# Patient Record
Sex: Female | Born: 1965 | Race: Black or African American | Hispanic: No | Marital: Single | Smoking: Current every day smoker
Health system: Southern US, Community
[De-identification: ages and names within clinical notes are randomized; demographics above are authoritative.]

## PROBLEM LIST (undated history)

## (undated) DIAGNOSIS — U071 COVID-19: Secondary | ICD-10-CM

## (undated) DIAGNOSIS — G4730 Sleep apnea, unspecified: Secondary | ICD-10-CM

## (undated) DIAGNOSIS — F329 Major depressive disorder, single episode, unspecified: Secondary | ICD-10-CM

## (undated) DIAGNOSIS — R519 Headache, unspecified: Secondary | ICD-10-CM

## (undated) DIAGNOSIS — M549 Dorsalgia, unspecified: Secondary | ICD-10-CM

## (undated) DIAGNOSIS — M797 Fibromyalgia: Secondary | ICD-10-CM

## (undated) DIAGNOSIS — F419 Anxiety disorder, unspecified: Secondary | ICD-10-CM

## (undated) DIAGNOSIS — G8929 Other chronic pain: Secondary | ICD-10-CM

## (undated) DIAGNOSIS — M069 Rheumatoid arthritis, unspecified: Secondary | ICD-10-CM

## (undated) DIAGNOSIS — M1990 Unspecified osteoarthritis, unspecified site: Secondary | ICD-10-CM

## (undated) DIAGNOSIS — F32A Depression, unspecified: Secondary | ICD-10-CM

## (undated) DIAGNOSIS — K219 Gastro-esophageal reflux disease without esophagitis: Secondary | ICD-10-CM

## (undated) DIAGNOSIS — E785 Hyperlipidemia, unspecified: Secondary | ICD-10-CM

## (undated) DIAGNOSIS — D649 Anemia, unspecified: Secondary | ICD-10-CM

## (undated) DIAGNOSIS — I729 Aneurysm of unspecified site: Secondary | ICD-10-CM

## (undated) DIAGNOSIS — J449 Chronic obstructive pulmonary disease, unspecified: Secondary | ICD-10-CM

## (undated) HISTORY — DX: Unspecified osteoarthritis, unspecified site: M19.90

## (undated) HISTORY — DX: Hyperlipidemia, unspecified: E78.5

## (undated) HISTORY — PX: APPENDECTOMY: SHX54

## (undated) HISTORY — PX: OVARIAN CYST REMOVAL: SHX89

## (undated) HISTORY — PX: CHOLECYSTECTOMY: SHX55

## (undated) HISTORY — PX: ABDOMINAL HYSTERECTOMY: SHX81

## (undated) HISTORY — PX: BACK SURGERY: SHX140

## (undated) HISTORY — DX: Sleep apnea, unspecified: G47.30

## (undated) HISTORY — DX: Gastro-esophageal reflux disease without esophagitis: K21.9

---

## 2009-12-13 ENCOUNTER — Emergency Department (HOSPITAL_BASED_OUTPATIENT_CLINIC_OR_DEPARTMENT_OTHER): Admission: EM | Admit: 2009-12-13 | Discharge: 2009-12-13 | Payer: Self-pay | Admitting: Emergency Medicine

## 2010-01-19 ENCOUNTER — Emergency Department (HOSPITAL_BASED_OUTPATIENT_CLINIC_OR_DEPARTMENT_OTHER): Admission: EM | Admit: 2010-01-19 | Discharge: 2010-01-19 | Payer: Self-pay | Admitting: Emergency Medicine

## 2010-05-02 ENCOUNTER — Emergency Department (HOSPITAL_BASED_OUTPATIENT_CLINIC_OR_DEPARTMENT_OTHER): Admission: EM | Admit: 2010-05-02 | Discharge: 2010-05-02 | Payer: Self-pay | Admitting: Emergency Medicine

## 2010-05-02 ENCOUNTER — Ambulatory Visit: Payer: Self-pay | Admitting: Diagnostic Radiology

## 2010-05-17 ENCOUNTER — Emergency Department (HOSPITAL_BASED_OUTPATIENT_CLINIC_OR_DEPARTMENT_OTHER): Admission: EM | Admit: 2010-05-17 | Discharge: 2010-05-17 | Payer: Self-pay | Admitting: Emergency Medicine

## 2010-05-17 ENCOUNTER — Ambulatory Visit: Payer: Self-pay | Admitting: Diagnostic Radiology

## 2010-09-05 ENCOUNTER — Ambulatory Visit: Payer: Self-pay | Admitting: Diagnostic Radiology

## 2010-09-05 ENCOUNTER — Emergency Department (HOSPITAL_BASED_OUTPATIENT_CLINIC_OR_DEPARTMENT_OTHER): Admission: EM | Admit: 2010-09-05 | Discharge: 2010-09-05 | Payer: Self-pay | Admitting: Emergency Medicine

## 2010-12-28 ENCOUNTER — Emergency Department (HOSPITAL_BASED_OUTPATIENT_CLINIC_OR_DEPARTMENT_OTHER)
Admission: EM | Admit: 2010-12-28 | Discharge: 2010-12-29 | Payer: Self-pay | Source: Home / Self Care | Admitting: Emergency Medicine

## 2010-12-28 LAB — COMPREHENSIVE METABOLIC PANEL
ALT: 21 U/L (ref 0–35)
Alkaline Phosphatase: 91 U/L (ref 39–117)
BUN: 7 mg/dL (ref 6–23)
CO2: 25 mEq/L (ref 19–32)
Calcium: 9.9 mg/dL (ref 8.4–10.5)
GFR calc non Af Amer: >60 mL/min (ref >60)
Glucose, Bld: 89 mg/dL (ref 70–99)
Potassium: 3.7 mEq/L (ref 3.5–5.1)
Sodium: 143 mEq/L (ref 135–145)

## 2010-12-28 LAB — URINALYSIS, ROUTINE W REFLEX MICROSCOPIC
Bilirubin Urine: NEGATIVE
Leukocytes, UA: NEGATIVE
Nitrite: NEGATIVE
Specific Gravity, Urine: 1.021 (ref 1.005–1.030)
Urobilinogen, UA: 1 mg/dL (ref 0.0–1.0)

## 2010-12-28 LAB — CBC
MCV: 87 fL (ref 78.0–100.0)
Platelets: 188 10*3/uL (ref 150–400)
RBC: 4.37 MIL/uL (ref 3.87–5.11)
RDW: 14 % (ref 11.5–15.5)
WBC: 8.5 10*3/uL (ref 4.0–10.5)

## 2010-12-28 LAB — URINE MICROSCOPIC-ADD ON

## 2010-12-28 LAB — LIPASE, BLOOD: Lipase: 50 U/L (ref 23–300)

## 2010-12-28 LAB — DIFFERENTIAL
Basophils Absolute: 0 10*3/uL (ref 0.0–0.1)
Basophils Relative: 0 % (ref 0–1)
Eosinophils Absolute: 0.1 10*3/uL (ref 0.0–0.7)
Eosinophils Relative: 2 % (ref 0–5)
Lymphs Abs: 2.4 10*3/uL (ref 0.7–4.0)
Neutrophils Relative %: 64 % (ref 43–77)

## 2011-02-16 LAB — WET PREP, GENITAL: Trich, Wet Prep: NONE SEEN

## 2011-02-16 LAB — URINALYSIS, ROUTINE W REFLEX MICROSCOPIC
Bilirubin Urine: NEGATIVE
Protein, ur: NEGATIVE mg/dL
Urobilinogen, UA: 1 mg/dL (ref 0.0–1.0)

## 2011-02-16 LAB — GC/CHLAMYDIA PROBE AMP, GENITAL
Chlamydia, DNA Probe: NEGATIVE
GC Probe Amp, Genital: NEGATIVE

## 2011-02-17 LAB — URINE CULTURE
Colony Count: NO GROWTH
Culture: NO GROWTH

## 2011-02-17 LAB — URINALYSIS, ROUTINE W REFLEX MICROSCOPIC
Ketones, ur: 15 mg/dL — AB
Nitrite: NEGATIVE
Protein, ur: NEGATIVE mg/dL
Urobilinogen, UA: 1 mg/dL (ref 0.0–1.0)
pH: 5.5 (ref 5.0–8.0)

## 2011-02-17 LAB — URINE MICROSCOPIC-ADD ON

## 2011-02-23 ENCOUNTER — Emergency Department (HOSPITAL_BASED_OUTPATIENT_CLINIC_OR_DEPARTMENT_OTHER)
Admission: EM | Admit: 2011-02-23 | Discharge: 2011-02-24 | Disposition: A | Discharge status: Home / Self Care | Payer: Medicaid Other | Attending: Emergency Medicine | Admitting: Emergency Medicine

## 2011-02-23 DIAGNOSIS — K219 Gastro-esophageal reflux disease without esophagitis: Secondary | ICD-10-CM | POA: Insufficient documentation

## 2011-02-23 DIAGNOSIS — R109 Unspecified abdominal pain: Secondary | ICD-10-CM | POA: Insufficient documentation

## 2011-02-23 DIAGNOSIS — F17200 Nicotine dependence, unspecified, uncomplicated: Secondary | ICD-10-CM | POA: Insufficient documentation

## 2011-02-23 DIAGNOSIS — R319 Hematuria, unspecified: Secondary | ICD-10-CM | POA: Insufficient documentation

## 2011-02-23 DIAGNOSIS — IMO0001 Reserved for inherently not codable concepts without codable children: Secondary | ICD-10-CM | POA: Insufficient documentation

## 2011-02-23 DIAGNOSIS — G8929 Other chronic pain: Secondary | ICD-10-CM | POA: Insufficient documentation

## 2011-02-23 DIAGNOSIS — J45909 Unspecified asthma, uncomplicated: Secondary | ICD-10-CM | POA: Insufficient documentation

## 2011-02-23 DIAGNOSIS — F319 Bipolar disorder, unspecified: Secondary | ICD-10-CM | POA: Insufficient documentation

## 2011-02-23 LAB — URINE MICROSCOPIC-ADD ON

## 2011-02-23 LAB — URINALYSIS, ROUTINE W REFLEX MICROSCOPIC
Glucose, UA: NEGATIVE mg/dL
Ketones, ur: 15 mg/dL — AB
Leukocytes, UA: NEGATIVE
Protein, ur: NEGATIVE mg/dL

## 2011-02-24 ENCOUNTER — Emergency Department (INDEPENDENT_AMBULATORY_CARE_PROVIDER_SITE_OTHER): Payer: Medicaid Other

## 2011-02-24 DIAGNOSIS — Z9079 Acquired absence of other genital organ(s): Secondary | ICD-10-CM

## 2011-02-24 DIAGNOSIS — R109 Unspecified abdominal pain: Secondary | ICD-10-CM

## 2011-02-25 LAB — URINE CULTURE: Culture  Setup Time: 201203261546

## 2011-06-14 ENCOUNTER — Encounter: Payer: Self-pay | Admitting: *Deleted

## 2011-06-14 ENCOUNTER — Emergency Department (HOSPITAL_BASED_OUTPATIENT_CLINIC_OR_DEPARTMENT_OTHER)
Admission: EM | Admit: 2011-06-14 | Discharge: 2011-06-14 | Disposition: A | Discharge status: Home / Self Care | Payer: Medicaid Other | Attending: Emergency Medicine | Admitting: Emergency Medicine

## 2011-06-14 ENCOUNTER — Emergency Department (INDEPENDENT_AMBULATORY_CARE_PROVIDER_SITE_OTHER): Payer: Medicaid Other

## 2011-06-14 DIAGNOSIS — R209 Unspecified disturbances of skin sensation: Secondary | ICD-10-CM | POA: Insufficient documentation

## 2011-06-14 DIAGNOSIS — R51 Headache: Secondary | ICD-10-CM

## 2011-06-14 DIAGNOSIS — F17200 Nicotine dependence, unspecified, uncomplicated: Secondary | ICD-10-CM | POA: Insufficient documentation

## 2011-06-14 HISTORY — DX: Depression, unspecified: F32.A

## 2011-06-14 HISTORY — DX: Anxiety disorder, unspecified: F41.9

## 2011-06-14 HISTORY — DX: Major depressive disorder, single episode, unspecified: F32.9

## 2011-06-14 MED ORDER — SODIUM CHLORIDE 0.9 % IV BOLUS (SEPSIS)
1000.0000 mL | Freq: Once | INTRAVENOUS | Status: AC
  Administered 2011-06-14: 1000 mL via INTRAVENOUS

## 2011-06-14 MED ORDER — METOCLOPRAMIDE HCL 10 MG PO TABS: 10.0000 mg | ORAL_TABLET | Freq: Four times a day (QID) | ORAL | Status: DC

## 2011-06-14 MED ORDER — IBUPROFEN 600 MG PO TABS: 600.0000 mg | ORAL_TABLET | Freq: Four times a day (QID) | ORAL | Status: AC | PRN

## 2011-06-14 MED ORDER — METOCLOPRAMIDE HCL 5 MG/ML IJ SOLN
10.0000 mg | Freq: Once | INTRAMUSCULAR | Status: AC
  Administered 2011-06-14: 10 mg via INTRAVENOUS
  Filled 2011-06-14: qty 2

## 2011-06-14 MED ORDER — KETOROLAC TROMETHAMINE 30 MG/ML IJ SOLN
30.0000 mg | Freq: Once | INTRAMUSCULAR | Status: AC
  Administered 2011-06-14: 30 mg via INTRAVENOUS
  Filled 2011-06-14: qty 1

## 2011-06-14 NOTE — ED Notes (Signed)
C/o constant headache x 1-2weeks. Described as "pins and needles". Mostly left sided and posterior head. Sensitive to light, noise.

## 2011-06-14 NOTE — ED Provider Notes (Signed)
History     Chief Complaint  Patient presents with  . Headache   HPI Comments: The patient states she has had 2 weeks of ongoing constant headache located in the posterior part of her head at the inferior part of her scalp in the upper neck. This has been constant worse with eating or drinking bright lights or loud sounds. She denies nausea vomiting fever chills., But she has had some tingling and mild numbness to her left upper extremity. There has been no difficulties with speech vision or lower extremity problems no ataxia and no coordination problems.  Patient is a 45 y.o. female presenting with headaches. The history is provided by the patient.  Headache  This is a new problem. Episode onset: 2 weeks ago. The problem occurs constantly. The problem has not changed since onset.The headache is associated with nothing. Pain location: Posterior and left posterior and left upper neck. The quality of the pain is described as sharp. The pain is at a severity of 10/10. The pain is severe. The pain radiates to the face. Pertinent negatives include no fever, no shortness of breath, no nausea and no vomiting. Associated symptoms comments: Left upper extremity numbness with pins and needles.. She has tried NSAIDs for the symptoms. The treatment provided no relief.    Past Medical History  Diagnosis Date  . Asthma   . Anxiety   . Depression     Past Surgical History  Procedure Date  . Back surgery   . Abdominal hysterectomy   . Ovarian cyst removal   . Appendectomy     History reviewed. No pertinent family history.  History  Substance Use Topics  . Smoking status: Current Everyday Smoker  . Smokeless tobacco: Not on file  . Alcohol Use: No    OB History    Grav Para Term Preterm Abortions TAB SAB Ect Mult Living                  Review of Systems  Constitutional: Negative for fever and chills.  HENT: Negative for sore throat and neck pain.   Eyes: Negative for visual  disturbance.  Respiratory: Negative for cough and shortness of breath.   Cardiovascular: Negative for chest pain.  Gastrointestinal: Negative for nausea, vomiting, abdominal pain and diarrhea.  Genitourinary: Negative for dysuria and frequency.  Musculoskeletal: Negative for back pain.  Skin: Negative for rash.  Neurological: Positive for numbness and headaches. Negative for weakness.  Hematological: Negative for adenopathy.  Psychiatric/Behavioral: Negative for behavioral problems.    Physical Exam  BP 141/85  Pulse 89  Temp(Src) 98.2 F (36.8 C) (Oral)  Ht 5\' 4"  (1.626 m)  Wt 217 lb (98.431 kg)  BMI 37.25 kg/m2  SpO2 97%  Physical Exam  Constitutional: She appears well-developed and well-nourished. No distress.  HENT:  Head: Normocephalic and atraumatic.  Mouth/Throat: Oropharynx is clear and moist. No oropharyngeal exudate.  Eyes: Conjunctivae and EOM are normal. Pupils are equal, round, and reactive to light. Right eye exhibits no discharge. Left eye exhibits no discharge. No scleral icterus.  Neck: Normal range of motion. Neck supple. No JVD present. No thyromegaly present.  Cardiovascular: Normal rate, regular rhythm, normal heart sounds and intact distal pulses.  Exam reveals no gallop and no friction rub.   No murmur heard. Pulmonary/Chest: Effort normal and breath sounds normal. No respiratory distress. She has no wheezes. She has no rales.  Abdominal: Soft. Bowel sounds are normal. She exhibits no distension and no mass. There  is no tenderness.  Musculoskeletal: Normal range of motion. She exhibits no edema and no tenderness.  Lymphadenopathy:    She has no cervical adenopathy.  Neurological: She is alert. Coordination normal.       Normal peripheral visual fields, speech, strength of upper and lower extremities. She has slight decreased sensation in the left upper extremity from the shoulder to the fingertips. She has no limb ataxia.  Skin: Skin is warm and dry. No  rash noted. She is not diaphoretic. No erythema.  Psychiatric: She has a normal mood and affect. Her behavior is normal.    ED Course  Procedures  MDM Patient has normal vital signs with a temperature of 98.2. Due to her headaches and left upper extremity numbness, will require head CT to rule out stroke. Patient states she has not had much to eat or drink because of increased pain with moving around the house. We'll give IV fluids and IV medications including Toradol and Reglan and reevaluate.   Patient has improved completely with medications and IV fluids. Agreeable to discharge. CT scan reviewed with no acute findings.   Vida Roller, MD 06/14/11 [phone]

## 2011-09-07 ENCOUNTER — Emergency Department (INDEPENDENT_AMBULATORY_CARE_PROVIDER_SITE_OTHER): Payer: Medicaid Other

## 2011-09-07 ENCOUNTER — Encounter (HOSPITAL_BASED_OUTPATIENT_CLINIC_OR_DEPARTMENT_OTHER): Payer: Self-pay

## 2011-09-07 ENCOUNTER — Emergency Department (HOSPITAL_BASED_OUTPATIENT_CLINIC_OR_DEPARTMENT_OTHER)
Admission: EM | Admit: 2011-09-07 | Discharge: 2011-09-07 | Disposition: A | Discharge status: Home / Self Care | Payer: Medicaid Other | Attending: Emergency Medicine | Admitting: Emergency Medicine

## 2011-09-07 DIAGNOSIS — W230XXA Caught, crushed, jammed, or pinched between moving objects, initial encounter: Secondary | ICD-10-CM

## 2011-09-07 DIAGNOSIS — S6980XA Other specified injuries of unspecified wrist, hand and finger(s), initial encounter: Secondary | ICD-10-CM

## 2011-09-07 DIAGNOSIS — J45909 Unspecified asthma, uncomplicated: Secondary | ICD-10-CM | POA: Insufficient documentation

## 2011-09-07 DIAGNOSIS — F341 Dysthymic disorder: Secondary | ICD-10-CM | POA: Insufficient documentation

## 2011-09-07 DIAGNOSIS — S6390XA Sprain of unspecified part of unspecified wrist and hand, initial encounter: Secondary | ICD-10-CM

## 2011-09-07 DIAGNOSIS — S60219A Contusion of unspecified wrist, initial encounter: Secondary | ICD-10-CM | POA: Insufficient documentation

## 2011-09-07 DIAGNOSIS — S60229A Contusion of unspecified hand, initial encounter: Secondary | ICD-10-CM

## 2011-09-07 DIAGNOSIS — M25539 Pain in unspecified wrist: Secondary | ICD-10-CM

## 2011-09-07 DIAGNOSIS — S63509A Unspecified sprain of unspecified wrist, initial encounter: Secondary | ICD-10-CM

## 2011-09-07 DIAGNOSIS — S6990XA Unspecified injury of unspecified wrist, hand and finger(s), initial encounter: Secondary | ICD-10-CM

## 2011-09-07 DIAGNOSIS — S60211A Contusion of right wrist, initial encounter: Secondary | ICD-10-CM

## 2011-09-07 MED ORDER — HYDROCODONE-ACETAMINOPHEN 5-325 MG PO TABS: 2.0000 | ORAL_TABLET | ORAL | Status: AC | PRN

## 2011-09-07 MED ORDER — HYDROCODONE-ACETAMINOPHEN 5-325 MG PO TABS
2.0000 | ORAL_TABLET | Freq: Once | ORAL | Status: AC
  Administered 2011-09-07: 2 via ORAL
  Filled 2011-09-07: qty 2

## 2011-09-07 MED ORDER — IBUPROFEN 800 MG PO TABS: 800.0000 mg | ORAL_TABLET | Freq: Once | ORAL | Status: DC

## 2011-09-07 NOTE — ED Notes (Signed)
Pt states that she got her hand caught in a car door.  Pt explains that it was slammed in the door.  Pt reports that she is unable to move her R index and middle fingers.  Circulation intact, no break in skin, no swelling noted at this time.

## 2011-09-08 NOTE — ED Provider Notes (Signed)
History     CSN: [id number] Arrival date & time: 09/07/2011  3:27 AM  Chief Complaint  Patient presents with  . Hand Injury    (Consider location/radiation/quality/duration/timing/severity/associated sxs/prior treatment) HPI Comments: The patient presents for evaluation of right hand pain after it was reportedly shut in a car door.  There is no break in the skin.  She complains principally of pain in the index and middle fingers.  Patient is a 45 y.o. female presenting with hand injury. The history is provided by the patient.  Hand Injury  The incident occurred 1 to 2 hours ago. The injury mechanism was compression. The pain is present in the right hand. The quality of the pain is described as aching and throbbing. The pain is moderate. The pain has been constant since the incident. She reports no foreign bodies present. The symptoms are aggravated by movement, use and palpation. She has tried nothing for the symptoms.    Past Medical History  Diagnosis Date  . Asthma   . Anxiety   . Depression     Past Surgical History  Procedure Date  . Back surgery   . Abdominal hysterectomy   . Ovarian cyst removal   . Appendectomy     History reviewed. No pertinent family history.  History  Substance Use Topics  . Smoking status: Current Everyday Smoker  . Smokeless tobacco: Not on file  . Alcohol Use: No    OB History    Grav Para Term Preterm Abortions TAB SAB Ect Mult Living                  Review of Systems  Musculoskeletal: Positive for joint swelling and arthralgias. Negative for back pain and gait problem.  Skin: Negative for color change and wound.  Neurological: Negative for numbness.    Allergies  Aspirin and Lithium  Home Medications   Current Outpatient Rx  Name Route Sig Dispense Refill  . FLUTICASONE-SALMETEROL 230-21 MCG/ACT IN AERO Inhalation Inhale 1 puff into the lungs 2 (two) times daily.      Marland Kitchen ALPRAZOLAM 1 MG PO TABS Oral Take 1 mg by mouth 3  (three) times daily.      Marland Kitchen HYDROCODONE-ACETAMINOPHEN 5-325 MG PO TABS Oral Take 2 tablets by mouth every 4 (four) hours as needed for pain. 20 tablet 0  . VENLAFAXINE HCL 75 MG PO TABS Oral Take 75 mg by mouth 2 (two) times daily.        BP 110/71  Pulse 111  Temp(Src) 98.3 F (36.8 C) (Oral)  Resp 18  Ht 5\' 4"  (1.626 m)  Wt 217 lb (98.431 kg)  BMI 37.25 kg/m2  SpO2 99%  Physical Exam  Nursing note and vitals reviewed. Constitutional: She is oriented to person, place, and time. She appears well-developed and well-nourished. She appears distressed.  HENT:  Head: Normocephalic and atraumatic.  Eyes: EOM are normal. Pupils are equal, round, and reactive to light.  Neck: Normal range of motion. Neck supple.  Cardiovascular: Normal rate, regular rhythm and intact distal pulses.   Pulmonary/Chest: Effort normal and breath sounds normal. No accessory muscle usage. No respiratory distress.  Abdominal: Normal appearance.  Musculoskeletal: She exhibits edema and tenderness.       Right hand: She exhibits decreased range of motion, tenderness, bony tenderness and swelling. She exhibits normal capillary refill and no deformity. normal sensation noted.       Hands: Neurological: She is alert and oriented to person, place, and time. She  has normal strength and normal reflexes. No cranial nerve deficit. Coordination normal. GCS eye subscore is 4. GCS verbal subscore is 5. GCS motor subscore is 6.  Skin: Skin is warm and dry.    ED Course  Procedures (including critical care time)  Labs Reviewed - No data to display Dg Wrist Complete Right  09/07/2011  *RADIOLOGY REPORT*  Clinical Data: Status post trauma, pain.  RIGHT WRIST - COMPLETE 3+ VIEW  Comparison: Contemporaneous hand  Findings: No displaced acute fracture or dislocation identified. No aggressive appearing osseous lesion.  IMPRESSION: No acute osseous abnormality of the wrist identified. If clinical concern for a fracture persists,  recommend a repeat radiograph in 5- 10 days to evaluate for interval change or callus formation.  Original Report Authenticated By: Waneta Martins, M.D.   Dg Hand Complete Right  09/07/2011  *RADIOLOGY REPORT*  Clinical Data: Status post trauma, unable to move fourth and fifth digits.  RIGHT HAND - COMPLETE 3+ VIEW  Comparison: Contemporaneous wrist  Findings: The fourth and fifth digits are hyperflexed at the proximal phalangeal joint.  No acute osseous abnormality identified.  No radiopaque foreign body.  IMPRESSION: Flexion of the fourth and fifth digits at the PIP joint may represent sequelae of tendon injury. No acute osseous abnormality identified.  Original Report Authenticated By: Waneta Martins, M.D.     1. Contusion of wrist, right   2. Wrist sprain   3. Hand contusion   4. Hand sprain       MDM  Contusion vs. Fracture considered among other potential etiologies in the patient's differential diagnosis.         Felisa Bonier, MD 09/08/11 2001

## 2011-11-03 ENCOUNTER — Encounter (HOSPITAL_BASED_OUTPATIENT_CLINIC_OR_DEPARTMENT_OTHER): Payer: Self-pay | Admitting: *Deleted

## 2011-11-03 ENCOUNTER — Emergency Department (INDEPENDENT_AMBULATORY_CARE_PROVIDER_SITE_OTHER): Payer: Medicaid Other

## 2011-11-03 DIAGNOSIS — J40 Bronchitis, not specified as acute or chronic: Secondary | ICD-10-CM | POA: Insufficient documentation

## 2011-11-03 DIAGNOSIS — R079 Chest pain, unspecified: Secondary | ICD-10-CM | POA: Insufficient documentation

## 2011-11-03 DIAGNOSIS — R05 Cough: Secondary | ICD-10-CM

## 2011-11-03 DIAGNOSIS — R059 Cough, unspecified: Secondary | ICD-10-CM

## 2011-11-03 DIAGNOSIS — F17200 Nicotine dependence, unspecified, uncomplicated: Secondary | ICD-10-CM | POA: Insufficient documentation

## 2011-11-03 DIAGNOSIS — F341 Dysthymic disorder: Secondary | ICD-10-CM | POA: Insufficient documentation

## 2011-11-03 DIAGNOSIS — J45909 Unspecified asthma, uncomplicated: Secondary | ICD-10-CM | POA: Insufficient documentation

## 2011-11-03 DIAGNOSIS — R0789 Other chest pain: Secondary | ICD-10-CM

## 2011-11-03 NOTE — ED Notes (Signed)
Pt presents to ED today with cough for the last 3 weeks.  Pt states chest "feels heavy" with coughing.  Pt has tried OTC cough medicine with no relief in sx

## 2011-11-04 ENCOUNTER — Emergency Department (HOSPITAL_BASED_OUTPATIENT_CLINIC_OR_DEPARTMENT_OTHER)
Admission: EM | Admit: 2011-11-04 | Discharge: 2011-11-04 | Disposition: A | Discharge status: Home / Self Care | Payer: Medicaid Other | Attending: Emergency Medicine | Admitting: Emergency Medicine

## 2011-11-04 DIAGNOSIS — J40 Bronchitis, not specified as acute or chronic: Secondary | ICD-10-CM

## 2011-11-04 MED ORDER — ALBUTEROL SULFATE HFA 108 (90 BASE) MCG/ACT IN AERS
2.0000 | INHALATION_SPRAY | RESPIRATORY_TRACT | Status: DC | PRN
  Administered 2011-11-04: 2 via RESPIRATORY_TRACT
  Filled 2011-11-04: qty 6.7

## 2011-11-04 MED ORDER — HYDROCODONE-ACETAMINOPHEN 7.5-500 MG/15ML PO SOLN
10.0000 mL | Freq: Once | ORAL | Status: AC
  Administered 2011-11-04: 10 mL via ORAL
  Filled 2011-11-04: qty 15

## 2011-11-04 MED ORDER — HYDROCODONE-ACETAMINOPHEN 7.5-500 MG/15ML PO SOLN: 10.0000 mL | ORAL | Status: AC | PRN

## 2011-11-04 NOTE — ED Notes (Signed)
MD at bedside. 

## 2011-11-04 NOTE — ED Provider Notes (Signed)
History     CSN: [id number] Arrival date & time: 11/04/2011 12:23 AM   First MD Initiated Contact with Patient 11/04/11 0236      Chief Complaint  Patient presents with  . Cough       (Consider location/radiation/quality/duration/timing/severity/associated sxs/prior treatment) HPI This is a 45 year old black female with a three-week history of cough. Symptoms are moderate to severe. They're associated with shortness of breath and chest soreness but no fever, nausea, vomiting, diarrhea, earache or sore throat. She has a history of asthma but is out of albuterol. She was hoping her symptoms would resolve on their own but came to the ED because they persist. The cough is characterized as nonproductive.  Past Medical History  Diagnosis Date  . Asthma   . Anxiety   . Depression     Past Surgical History  Procedure Date  . Back surgery   . Abdominal hysterectomy   . Ovarian cyst removal   . Appendectomy     History reviewed. No pertinent family history.  History  Substance Use Topics  . Smoking status: Current Everyday Smoker  . Smokeless tobacco: Not on file  . Alcohol Use: No    OB History    Grav Para Term Preterm Abortions TAB SAB Ect Mult Living                  Review of Systems  All other systems reviewed and are negative.    Allergies  Aspirin and Lithium  Home Medications   Current Outpatient Rx  Name Route Sig Dispense Refill  . ALPRAZOLAM 1 MG PO TABS Oral Take 1 mg by mouth 3 (three) times daily.      Marland Kitchen FLUTICASONE-SALMETEROL 230-21 MCG/ACT IN AERO Inhalation Inhale 1 puff into the lungs 2 (two) times daily.      . VENLAFAXINE HCL 75 MG PO TABS Oral Take 75 mg by mouth 2 (two) times daily.        BP 117/72  Pulse 75  Temp(Src) 99.4 F (37.4 C) (Oral)  Resp 16  Ht 5\' 4"  (1.626 m)  Wt 217 lb (98.431 kg)  BMI 37.25 kg/m2  SpO2 95%  Physical Exam General: Well-developed, well-nourished female in no acute distress; appearance consistent  with age of record HENT: normocephalic, atraumatic; TMs normal; no pharyngeal erythema or exudate Eyes: pupils equal round and reactive to light; extraocular muscles intact; arcus senilis bilaterally Neck: supple Heart: regular rate and rhythm Lungs: Lung fields clear with mildly decreased air movement; coughing particularly after taking a deep breath Abdomen: soft; nontender; nondistended Extremities: No deformity; full range of motion Neurologic: Awake, alert and oriented; motor function intact in all extremities and symmetric; no facial droop Skin: Warm and dry     ED Course  Procedures (including critical care time)  Labs Reviewed - No data to display Dg Chest 2 View  11/04/2011  *RADIOLOGY REPORT*  Clinical Data: Mid chest tightness with cough for 3 weeks.  CHEST - 2 VIEW  Comparison: 05/17/2010 radiographs.  Findings: The heart size and mediastinal contours are stable. There is stable mild central airway thickening.  No confluent airspace opacity, edema or pleural effusion is identified.  The osseous structures appear unchanged.  IMPRESSION: Stable central airway thickening consistent with bronchitis or viral infection.  No evidence of pneumonia.  Original Report Authenticated By: Gerrianne Scale, M.D.      MDM           Hanley Seamen, MD 11/04/11 [phone]

## 2011-11-04 NOTE — ED Notes (Signed)
Pt has been having dry cough for few days, denies fever. Pt has been using halls and ny-quil cough without relief.

## 2012-01-16 ENCOUNTER — Encounter (HOSPITAL_BASED_OUTPATIENT_CLINIC_OR_DEPARTMENT_OTHER): Payer: Self-pay | Admitting: *Deleted

## 2012-01-16 DIAGNOSIS — J45909 Unspecified asthma, uncomplicated: Secondary | ICD-10-CM | POA: Insufficient documentation

## 2012-01-16 DIAGNOSIS — B9689 Other specified bacterial agents as the cause of diseases classified elsewhere: Secondary | ICD-10-CM | POA: Insufficient documentation

## 2012-01-16 DIAGNOSIS — F341 Dysthymic disorder: Secondary | ICD-10-CM | POA: Insufficient documentation

## 2012-01-16 DIAGNOSIS — A499 Bacterial infection, unspecified: Secondary | ICD-10-CM | POA: Insufficient documentation

## 2012-01-16 DIAGNOSIS — N760 Acute vaginitis: Secondary | ICD-10-CM | POA: Insufficient documentation

## 2012-01-16 DIAGNOSIS — R300 Dysuria: Secondary | ICD-10-CM | POA: Insufficient documentation

## 2012-01-16 LAB — URINALYSIS, ROUTINE W REFLEX MICROSCOPIC
Protein, ur: NEGATIVE mg/dL
Urobilinogen, UA: 1 mg/dL (ref 0.0–1.0)

## 2012-01-16 LAB — URINE MICROSCOPIC-ADD ON

## 2012-01-16 NOTE — ED Notes (Signed)
Cloudy urine. Lower left quad pain. Burning inside her vaginal.

## 2012-01-17 ENCOUNTER — Emergency Department (HOSPITAL_BASED_OUTPATIENT_CLINIC_OR_DEPARTMENT_OTHER)
Admission: EM | Admit: 2012-01-17 | Discharge: 2012-01-17 | Disposition: A | Discharge status: Home / Self Care | Payer: Medicaid Other | Attending: Emergency Medicine | Admitting: Emergency Medicine

## 2012-01-17 DIAGNOSIS — N760 Acute vaginitis: Secondary | ICD-10-CM

## 2012-01-17 DIAGNOSIS — B9689 Other specified bacterial agents as the cause of diseases classified elsewhere: Secondary | ICD-10-CM

## 2012-01-17 LAB — WET PREP, GENITAL: Trich, Wet Prep: NONE SEEN

## 2012-01-17 MED ORDER — METRONIDAZOLE IN NACL 5-0.79 MG/ML-% IV SOLN: 500.0000 mg | Freq: Once | INTRAVENOUS | Status: DC

## 2012-01-17 MED ORDER — METRONIDAZOLE 500 MG PO TABS
500.0000 mg | ORAL_TABLET | Freq: Once | ORAL | Status: AC
  Administered 2012-01-17: 500 mg via ORAL
  Filled 2012-01-17: qty 1

## 2012-01-17 MED ORDER — METRONIDAZOLE 500 MG PO TABS: 500.0000 mg | ORAL_TABLET | Freq: Two times a day (BID) | ORAL | Status: AC

## 2012-01-17 NOTE — ED Provider Notes (Signed)
History     CSN: [id number]  Arrival date & time 01/16/12  2243   First MD Initiated Contact with Patient 01/17/12 0022      Chief Complaint  Patient presents with  . Dysuria    (Consider location/radiation/quality/duration/timing/severity/associated sxs/prior treatment) HPI This is a 46 year old black female with a history of frequent urinary tract infections. She complains of burning of the vulva, worse with urination or intercourse. The symptoms are moderate. She has tried over-the-counter Monistat without relief. She also complains of left lower quadrant pain, but states this may be due to a known ovarian cyst. She denies fever. She's had some chills. She's had occasional nausea and vomiting. Her urine was noted to be cloudy in triage.   Past Medical History  Diagnosis Date  . Asthma   . Anxiety   . Depression     Past Surgical History  Procedure Date  . Back surgery   . Abdominal hysterectomy   . Ovarian cyst removal   . Appendectomy     No family history on file.  History  Substance Use Topics  . Smoking status: Current Everyday Smoker  . Smokeless tobacco: Not on file  . Alcohol Use: No    OB History    Grav Para Term Preterm Abortions TAB SAB Ect Mult Living                  Review of Systems  All other systems reviewed and are negative.    Allergies  Aspirin and Lithium  Home Medications   Current Outpatient Rx  Name Route Sig Dispense Refill  . ALPRAZOLAM 1 MG PO TABS Oral Take 1 mg by mouth 3 (three) times daily.      Marland Kitchen FLUTICASONE-SALMETEROL 250-50 MCG/DOSE IN AEPB Inhalation Inhale 1 puff into the lungs every 12 (twelve) hours.    . VENLAFAXINE HCL 75 MG PO TABS Oral Take 75 mg by mouth 3 (three) times daily.       BP 106/76  Pulse 78  Temp(Src) 98.1 F (36.7 C) (Oral)  Resp 22  SpO2 98%  Physical Exam General: Well-developed, well-nourished female in no acute distress; appearance consistent with age of record HENT: normocephalic,  atraumatic Eyes: pupils equal round and reactive to light; extraocular muscles intact Neck: supple Heart: regular rate and rhythm Lungs: clear to auscultation bilaterally Abdomen: soft; nondistende; mild left lower quadrant tenderness; no masses or hepatosplenomegaly; bowel sounds present GU: Physiologic appearing vaginal discharge; surgical absence of cervix; mild inflammation of vulvar mucosa Extremities: No deformity; full range of motion Neurologic: Awake, alert and oriented; motor function intact in all extremities and symmetric; no facial droop Skin: Warm and dry Psychiatric: Normal mood and affect    ED Course  Procedures (including critical care time)     MDM   Nursing notes and vitals signs, including pulse oximetry, reviewed.  Summary of this visit's results, reviewed by myself:  Labs:  Results for orders placed during the hospital encounter of 01/17/12  URINALYSIS, ROUTINE W REFLEX MICROSCOPIC      Component Value Range   Color, Urine YELLOW  YELLOW    APPearance CLEAR  CLEAR    Specific Gravity, Urine 1.028  1.005 - 1.030    pH 6.0  5.0 - 8.0    Glucose, UA NEGATIVE  NEGATIVE (mg/dL)   Hgb urine dipstick MODERATE (*) NEGATIVE    Bilirubin Urine NEGATIVE  NEGATIVE    Ketones, ur 15 (*) NEGATIVE (mg/dL)   Protein, ur NEGATIVE  NEGATIVE (mg/dL)   Urobilinogen, UA 1.0  0.0 - 1.0 (mg/dL)   Nitrite NEGATIVE  NEGATIVE    Leukocytes, UA NEGATIVE  NEGATIVE   URINE MICROSCOPIC-ADD ON      Component Value Range   Squamous Epithelial / LPF FEW (*) RARE    WBC, UA 0-2  <3 (WBC/hpf)   RBC / HPF 7-10  <3 (RBC/hpf)   Bacteria, UA FEW (*) RARE    Crystals CA OXALATE CRYSTALS (*) NEGATIVE    Urine-Other MUCOUS PRESENT    WET PREP, GENITAL      Component Value Range   Yeast Wet Prep HPF POC NONE SEEN  NONE SEEN    Trich, Wet Prep NONE SEEN  NONE SEEN    Clue Cells Wet Prep HPF POC MODERATE (*) NONE SEEN    WBC, Wet Prep HPF POC FEW (*) NONE SEEN               Hanley Seamen, MD 01/17/12 [phone]

## 2012-01-17 NOTE — Discharge Instructions (Signed)
Bacterial Vaginosis Bacterial vaginosis (BV) is a vaginal infection where the normal balance of bacteria in the vagina is disrupted. The normal balance is then replaced by an overgrowth of certain bacteria. There are several different kinds of bacteria that can cause BV. BV is the most common vaginal infection in women of childbearing age. CAUSES   The cause of BV is not fully understood. BV develops when there is an increase or imbalance of harmful bacteria.   Some activities or behaviors can upset the normal balance of bacteria in the vagina and put women at increased risk including:   Having a new sex partner or multiple sex partners.   Douching.   Using an intrauterine device (IUD) for contraception.   It is not clear what role sexual activity plays in the development of BV. However, women that have never had sexual intercourse are rarely infected with BV.  Women do not get BV from toilet seats, bedding, swimming pools or from touching objects around them.  SYMPTOMS   Grey vaginal discharge.   A fish-like odor with discharge, especially after sexual intercourse.   Itching or burning of the vagina and vulva.   Burning or pain with urination.   Some women have no signs or symptoms at all.  DIAGNOSIS  Your caregiver must examine the vagina for signs of BV. Your caregiver will perform lab tests and look at the sample of vaginal fluid through a microscope. They will look for bacteria and abnormal cells (clue cells).  TREATMENT  Sometimes BV will clear up without treatment. However, all women with symptoms of BV should be treated to avoid complications, especially if gynecology surgery is planned. Female partners generally do not need to be treated. However, BV may spread between female sex partners so treatment is helpful in preventing a recurrence of BV.   BV may be treated with antibiotics. The antibiotics come in either pill or vaginal cream forms. Either can be used with nonpregnant  or pregnant women, but the recommended dosages differ. These antibiotics are not harmful to the baby.   BV can recur after treatment. If this happens, a second round of antibiotics will often be prescribed.   Treatment is important for pregnant women. If not treated, BV can cause a premature delivery, especially for a pregnant woman who had a premature birth in the past. All pregnant women who have symptoms of BV should be checked and treated.   For chronic reoccurrence of BV, treatment with a type of prescribed gel vaginally twice a week is helpful.  HOME CARE INSTRUCTIONS   Finish all medication as directed by your caregiver.   Do not have sex until treatment is completed.   Tell your sexual partner that you have a vaginal infection. They should see their caregiver and be treated if they have problems, such as a mild rash or itching.   Practice safe sex. Use condoms. Only have 1 sex partner.  PREVENTION  Basic prevention steps can help reduce the risk of upsetting the natural balance of bacteria in the vagina and developing BV:  Do not have sexual intercourse (be abstinent).   Do not douche.   Use all of the medicine prescribed for treatment of BV, even if the signs and symptoms go away.   Tell your sex partner if you have BV. That way, they can be treated, if needed, to prevent reoccurrence.  SEEK MEDICAL CARE IF:   Your symptoms are not improving after 3 days of treatment.  You have increased discharge, pain, or fever.  MAKE SURE YOU:   Understand these instructions.   Will watch your condition.   Will get help right away if you are not doing well or get worse.  FOR MORE INFORMATION  Division of STD Prevention (DSTDP), Centers for Disease Control and Prevention: [employer] American [employer] Association (ASHA): www.ashastd.org  Document Released: 11/17/2005 Document Revised: 07/30/2011 Document Reviewed: 05/10/2009 [facility] Patient Information 2012 [place],  [place].

## 2012-01-18 LAB — URINE CULTURE
Colony Count: NO GROWTH
Culture: NO GROWTH

## 2012-01-19 LAB — GC/CHLAMYDIA PROBE AMP, GENITAL: Chlamydia, DNA Probe: NEGATIVE

## 2012-03-31 ENCOUNTER — Emergency Department (HOSPITAL_COMMUNITY)
Admission: EM | Admit: 2012-03-31 | Discharge: 2012-03-31 | Disposition: A | Discharge status: Home / Self Care | Payer: Medicare Other | Attending: Emergency Medicine | Admitting: Emergency Medicine

## 2012-03-31 ENCOUNTER — Emergency Department (HOSPITAL_COMMUNITY): Payer: Medicare Other

## 2012-03-31 ENCOUNTER — Encounter (HOSPITAL_COMMUNITY): Payer: Self-pay | Admitting: Emergency Medicine

## 2012-03-31 DIAGNOSIS — S6392XA Sprain of unspecified part of left wrist and hand, initial encounter: Secondary | ICD-10-CM

## 2012-03-31 DIAGNOSIS — M25569 Pain in unspecified knee: Secondary | ICD-10-CM | POA: Insufficient documentation

## 2012-03-31 DIAGNOSIS — M79609 Pain in unspecified limb: Secondary | ICD-10-CM | POA: Insufficient documentation

## 2012-03-31 DIAGNOSIS — M25562 Pain in left knee: Secondary | ICD-10-CM

## 2012-03-31 DIAGNOSIS — W19XXXA Unspecified fall, initial encounter: Secondary | ICD-10-CM

## 2012-03-31 DIAGNOSIS — IMO0002 Reserved for concepts with insufficient information to code with codable children: Secondary | ICD-10-CM | POA: Insufficient documentation

## 2012-03-31 DIAGNOSIS — Z79899 Other long term (current) drug therapy: Secondary | ICD-10-CM | POA: Insufficient documentation

## 2012-03-31 DIAGNOSIS — W010XXA Fall on same level from slipping, tripping and stumbling without subsequent striking against object, initial encounter: Secondary | ICD-10-CM | POA: Insufficient documentation

## 2012-03-31 DIAGNOSIS — F341 Dysthymic disorder: Secondary | ICD-10-CM | POA: Insufficient documentation

## 2012-03-31 DIAGNOSIS — S6390XA Sprain of unspecified part of unspecified wrist and hand, initial encounter: Secondary | ICD-10-CM | POA: Insufficient documentation

## 2012-03-31 DIAGNOSIS — S53402A Unspecified sprain of left elbow, initial encounter: Secondary | ICD-10-CM

## 2012-03-31 DIAGNOSIS — Y9229 Other specified public building as the place of occurrence of the external cause: Secondary | ICD-10-CM | POA: Insufficient documentation

## 2012-03-31 DIAGNOSIS — M25539 Pain in unspecified wrist: Secondary | ICD-10-CM | POA: Insufficient documentation

## 2012-03-31 DIAGNOSIS — M25519 Pain in unspecified shoulder: Secondary | ICD-10-CM | POA: Insufficient documentation

## 2012-03-31 DIAGNOSIS — J45909 Unspecified asthma, uncomplicated: Secondary | ICD-10-CM | POA: Insufficient documentation

## 2012-03-31 DIAGNOSIS — M542 Cervicalgia: Secondary | ICD-10-CM | POA: Insufficient documentation

## 2012-03-31 DIAGNOSIS — M25529 Pain in unspecified elbow: Secondary | ICD-10-CM | POA: Insufficient documentation

## 2012-03-31 MED ORDER — CYCLOBENZAPRINE HCL 10 MG PO TABS: 10.0000 mg | ORAL_TABLET | Freq: Two times a day (BID) | ORAL | Status: AC | PRN

## 2012-03-31 MED ORDER — ONDANSETRON 8 MG PO TBDP
8.0000 mg | ORAL_TABLET | Freq: Once | ORAL | Status: AC
  Administered 2012-03-31: 8 mg via ORAL
  Filled 2012-03-31: qty 1

## 2012-03-31 MED ORDER — HYDROMORPHONE HCL PF 1 MG/ML IJ SOLN
1.0000 mg | Freq: Once | INTRAMUSCULAR | Status: AC
  Administered 2012-03-31: 1 mg via INTRAMUSCULAR
  Filled 2012-03-31: qty 1

## 2012-03-31 MED ORDER — HYDROCODONE-ACETAMINOPHEN 5-500 MG PO TABS: 1.0000 | ORAL_TABLET | Freq: Four times a day (QID) | ORAL | Status: AC | PRN

## 2012-03-31 NOTE — ED Notes (Signed)
Pt. Brittney Sanford as she was going into store tonight on her left side.  Pt. Now c/o left hand pain, left knee pain, left shoulder pain, and neck pain.  She is rating her pain as a 10.  Pt. Ambulated to car before EMS arrived.

## 2012-03-31 NOTE — ED Notes (Signed)
UJW:[id number]> Expected date:03/31/12<BR> Expected time:<BR> Means of arrival:<BR> Comments:<BR> EMS 261 GC - fall/not immobilized/back pain/stable

## 2012-03-31 NOTE — ED Provider Notes (Signed)
History     CSN: [id number]  Arrival date & time 03/31/12  2020   First MD Initiated Contact with Patient 03/31/12 2044      Chief Complaint  Patient presents with  . Fall    (Consider location/radiation/quality/duration/timing/severity/associated sxs/prior treatment) Patient is a 46 y.o. female presenting with fall. The history is provided by the patient.  Fall The accident occurred 1 to 2 hours ago. The fall occurred while walking. She landed on a hard floor. There was no blood loss. The point of impact was the left shoulder and left knee (left hand, left elbow). The pain is present in the left knee, left elbow and left wrist (left hand). The pain is at a severity of 10/10. The pain is moderate. She was ambulatory at the scene. There was no entrapment after the fall. There was no alcohol use involved in the accident. Pertinent negatives include no fever and no abdominal pain. The symptoms are aggravated by ambulation, extension, flexion and use of the injured limb.  Pt states she fell after slipping on something on the floor at a store. States fell on left side. Pain in left entire arm, left hand, left knee. Stats was able to ambulate. Pain 10/10. Denies hitting her head. Denies LOC.   Past Medical History  Diagnosis Date  . Asthma   . Anxiety   . Depression     Past Surgical History  Procedure Date  . Back surgery   . Abdominal hysterectomy   . Ovarian cyst removal   . Appendectomy     No family history on file.  History  Substance Use Topics  . Smoking status: Current Everyday Smoker  . Smokeless tobacco: Not on file  . Alcohol Use: No    OB History    Grav Para Term Preterm Abortions TAB SAB Ect Mult Living                  Review of Systems  Constitutional: Negative for fever and chills.  HENT: Positive for neck pain.   Eyes: Negative.   Respiratory: Negative.   Cardiovascular: Negative.   Gastrointestinal: Negative for abdominal pain.  Genitourinary:  Negative.   Musculoskeletal: Positive for joint swelling and arthralgias.  Skin: Negative.   Neurological: Negative for syncope, weakness and light-headedness.    Allergies  Aspirin and Lithium  Home Medications   Current Outpatient Rx  Name Route Sig Dispense Refill  . ALPRAZOLAM 1 MG PO TABS Oral Take 1 mg by mouth 3 (three) times daily.     Marland Kitchen ESTRADIOL 1 MG PO TABS Oral Take 2 mg by mouth daily.    Marland Kitchen OMEPRAZOLE 20 MG PO CPDR Oral Take 20 mg by mouth daily.      BP 105/68  Pulse 80  Temp(Src) 98.3 F (36.8 C) (Oral)  Resp 18  SpO2 100%  Physical Exam  Nursing note and vitals reviewed. Constitutional: She is oriented to person, place, and time. She appears well-developed and well-nourished.       Appears to be in pain  HENT:  Head: Normocephalic and atraumatic.  Eyes: Conjunctivae are normal.  Neck: Neck supple.  Cardiovascular: Normal rate, regular rhythm and normal heart sounds.   Pulmonary/Chest: Effort normal and breath sounds normal. No respiratory distress.  Abdominal: Soft. Bowel sounds are normal. There is no tenderness.  Musculoskeletal:       Normal appearing left shoulder, elbow, wrist, hand, with no obvious swelling or bruising. Tender to palpation over entire left elbow,  unable to flex or extend elbow, holding it in 90Deg flexion. Pain with ROM of the left shoulder, full ROM. Normal left wrist. Pain with palpation over entire hand, thumb, and all fingers. Pain with flexion and extension of all fingers, although full rom. Pt able to make a fist. Left knee appears normal. No tenderness over posterior knee, medial or lateral joint. Tender to palpation over left patella. Tendon appears intact. Unable assess if stable bc of pt's discomfort. Pain with rom.   Neurological: She is alert and oriented to person, place, and time.  Skin: Skin is warm and dry.  Psychiatric: She has a normal mood and affect.    ED Course  Procedures (including critical care time)  Pt  post fall, pain to left arm and leg. No head injury. Pt AAOx3.   Dg Elbow Complete Left  03/31/2012  *RADIOLOGY REPORT*  Clinical Data: Fall, left elbow pain  LEFT ELBOW - COMPLETE 3+ VIEW  Comparison: None.  Findings: No evidence of fracture or  dislocation of the left elbow.  No joint effusion.  IMPRESSION: No fracture or effusion.  Original Report Authenticated By: Genevive Bi, M.D.   Dg Knee Complete 4 Views Left  03/31/2012  *RADIOLOGY REPORT*  Clinical Data: Fall, knee pain  LEFT KNEE - COMPLETE 4+ VIEW  Comparison: None.  Findings: No fracture or dislocation of the left knee.  No joint effusion.  IMPRESSION: No fracture or dislocation.  Original Report Authenticated By: Genevive Bi, M.D.   Dg Hand Complete Left  03/31/2012  *RADIOLOGY REPORT*  Clinical Data: Fall  LEFT HAND - COMPLETE 3+ VIEW  Comparison: None.  Findings: There is no fracture or dislocation of the left hand.  IMPRESSION: No fracture.  Original Report Authenticated By: Genevive Bi, M.D.    10:23 PM X-rays all negative. I will place pt in a knee immobilizer, sling. Will follow up with Orthopedics.   1. Fall   2. Sprain of left elbow   3. Sprain of left hand   4. Pain in left knee       MDM          Lottie Mussel, PA 04/01/12 [phone]

## 2012-03-31 NOTE — Discharge Instructions (Signed)
Your x-rays today are normal. Keep your elbow, knee, hand elevated as much as able, stay off of your leg, avoid lifting anything over 1lb with left arm. Ice several times a day. vicodin for pain as prescribed as needed, do not drive if taking. Flexeril for spams. If no improvement in 3-5 days, follow up with your doctor or orthopedics.   Joint Sprain A sprain is a tear or stretch in the ligaments that hold a joint together. Severe sprains may need as long as 3-6 weeks of immobilization and/or exercises to heal completely. Sprained joints should be rested and protected. If not, they can become unstable and prone to re-injury. Proper treatment can reduce your pain, shorten the period of disability, and reduce the risk of repeated injuries. TREATMENT   Rest and elevate the injured joint to reduce pain and swelling.   Apply ice packs to the injury for 20-30 minutes every 2-3 hours for the next 2-3 days.   Keep the injury wrapped in a compression bandage or splint as long as the joint is painful or as instructed by your caregiver.   Do not use the injured joint until it is completely healed to prevent re-injury and chronic instability. Follow the instructions of your caregiver.   Long-term sprain management may require exercises and/or treatment by a physical therapist. Taping or special braces may help stabilize the joint until it is completely better.  SEEK MEDICAL CARE IF:   You develop increased pain or swelling of the joint.   You develop increasing redness and warmth of the joint.   You develop a fever.   It becomes stiff.   Your hand or foot gets cold or numb.  Document Released: 12/25/2004 Document Revised: 11/06/2011 Document Reviewed: 12/04/2008 [facility] Patient Information 2012 [place], [place].

## 2012-04-01 NOTE — ED Provider Notes (Signed)
Medical screening examination/treatment/procedure(s) were performed by non-physician practitioner and as supervising physician I was immediately available for consultation/collaboration.   Delson Dulworth, MD 04/01/12 2032 

## 2012-04-05 ENCOUNTER — Emergency Department (INDEPENDENT_AMBULATORY_CARE_PROVIDER_SITE_OTHER): Payer: Medicare Other

## 2012-04-05 ENCOUNTER — Encounter (HOSPITAL_BASED_OUTPATIENT_CLINIC_OR_DEPARTMENT_OTHER): Payer: Self-pay | Admitting: *Deleted

## 2012-04-05 ENCOUNTER — Emergency Department (HOSPITAL_BASED_OUTPATIENT_CLINIC_OR_DEPARTMENT_OTHER)
Admission: EM | Admit: 2012-04-05 | Discharge: 2012-04-05 | Disposition: A | Discharge status: Home / Self Care | Payer: Medicare Other | Attending: Emergency Medicine | Admitting: Emergency Medicine

## 2012-04-05 DIAGNOSIS — IMO0001 Reserved for inherently not codable concepts without codable children: Secondary | ICD-10-CM | POA: Insufficient documentation

## 2012-04-05 DIAGNOSIS — J45909 Unspecified asthma, uncomplicated: Secondary | ICD-10-CM | POA: Insufficient documentation

## 2012-04-05 DIAGNOSIS — M25539 Pain in unspecified wrist: Secondary | ICD-10-CM

## 2012-04-05 DIAGNOSIS — F411 Generalized anxiety disorder: Secondary | ICD-10-CM | POA: Insufficient documentation

## 2012-04-05 DIAGNOSIS — M25569 Pain in unspecified knee: Secondary | ICD-10-CM | POA: Insufficient documentation

## 2012-04-05 DIAGNOSIS — M7918 Myalgia, other site: Secondary | ICD-10-CM

## 2012-04-05 DIAGNOSIS — F329 Major depressive disorder, single episode, unspecified: Secondary | ICD-10-CM | POA: Insufficient documentation

## 2012-04-05 DIAGNOSIS — R937 Abnormal findings on diagnostic imaging of other parts of musculoskeletal system: Secondary | ICD-10-CM

## 2012-04-05 DIAGNOSIS — F3289 Other specified depressive episodes: Secondary | ICD-10-CM | POA: Insufficient documentation

## 2012-04-05 MED ORDER — TRAMADOL HCL 50 MG PO TABS: 50.0000 mg | ORAL_TABLET | Freq: Four times a day (QID) | ORAL | Status: AC | PRN

## 2012-04-05 MED ORDER — HYDROCODONE-ACETAMINOPHEN 5-325 MG PO TABS
2.0000 | ORAL_TABLET | Freq: Once | ORAL | Status: AC
  Administered 2012-04-05: 2 via ORAL
  Filled 2012-04-05 (×2): qty 2

## 2012-04-05 NOTE — ED Provider Notes (Signed)
History  This chart was scribed for [name], MD by [name]. This patient was seen in room MH03/MH03 and the patient's care was started at 4:58PM.  CSN: [id number]  Arrival date & time 04/05/12  1636   First MD Initiated Contact with Patient 04/05/12 1658      Chief Complaint  Patient presents with  . Knee Pain    The history is provided by the patient. No language interpreter was used.    Brittney Sanford is a 46 y.o. female who presents to the Emergency Department complaining of 5 days of sudden onset, gradually worsening, constant left knee pain described as throbbing that radiates down the the left ankle after a fall. She states that her left leg slid out from under her sideways and she landed on her left side. She rates her knee pain a 10 out of 10. Pt was seen at the [facility] after the fall and was given a leg immobilizer and prescribed hydrocodone and flexeril.  At that time left hand, left knee, and left elbow were x-rayed. Pt states that she ran out of the hydrocodone 2 days ago but is still taking one dose of Flexeril a day with mild improvement in pain. She reports that she has been wearing the leg immobilizer almost constantly since the fall but states that she did some cleaning w/o leg brace 2 days ago for one hour.  She still c/o left thumb pain, left wrist pain, left shoulder pain and left breast pain. She denies having any other trauma or accident since the first fall. She reports that she was taken off her leg immobilizer and given a cane by an Orthopedist (Dr. Eulah Pont) today. She reports increased pain with walking and bending after switching to the cane and is requesting to be re-evaluated . She reports that she has appointment with PCP in 2 days but could not wait to be seen. She has a h/o fibromyalgia, asthma and anxiety. She is a current everyday smoker but denies alcohol use.  Orthopedist is Dr. Eulah Pont in [place].  Past Medical History  Diagnosis Date  .  Asthma   . Anxiety   . Depression     Past Surgical History  Procedure Date  . Back surgery   . Abdominal hysterectomy   . Ovarian cyst removal   . Appendectomy     History reviewed. No pertinent family history.  History  Substance Use Topics  . Smoking status: Current Everyday Smoker  . Smokeless tobacco: Not on file  . Alcohol Use: No     Review of Systems  Constitutional: Negative.   HENT: Negative.   Eyes: Negative.   Respiratory: Negative.   Cardiovascular: Negative.   Gastrointestinal: Negative.   Genitourinary: Negative.   Musculoskeletal:       Left knee, shoulder and hand pain  Skin: Negative.   Neurological: Negative.   Hematological: Negative.   Psychiatric/Behavioral: Negative.     Allergies  Aspirin and Lithium  Home Medications   Current Outpatient Rx  Name Route Sig Dispense Refill  . CYCLOBENZAPRINE HCL 10 MG PO TABS Oral Take 1 tablet (10 mg total) by mouth 2 (two) times daily as needed for muscle spasms. 20 tablet 0  . ESTRADIOL 1 MG PO TABS Oral Take 2 mg by mouth daily.    Marland Kitchen HYDROCODONE-ACETAMINOPHEN 5-500 MG PO TABS Oral Take 1-2 tablets by mouth every 6 (six) hours as needed for pain. 15 tablet 0  . OMEPRAZOLE 20 MG PO CPDR  Oral Take 20 mg by mouth daily.    Marland Kitchen ALPRAZOLAM 1 MG PO TABS Oral Take 1 mg by mouth 3 (three) times daily.       Triage Vitals: BP 120/81  Pulse 98  Temp 98.6 F (37 C)  Resp 18  Ht 5\' 4"  (1.626 m)  Wt 219 lb (99.338 kg)  BMI 37.59 kg/m2  SpO2 100%  Physical Exam  Nursing note and vitals reviewed. Constitutional: She is oriented to person, place, and time. She appears well-developed and well-nourished.  HENT:  Head: Normocephalic and atraumatic.  Eyes: Conjunctivae and EOM are normal.  Neck: Normal range of motion. Neck supple.  Cardiovascular: Normal rate, regular rhythm and normal heart sounds.   Pulmonary/Chest: Effort normal and breath sounds normal.  Abdominal: Soft. There is no tenderness.    Musculoskeletal: Normal range of motion. She exhibits tenderness. She exhibits no edema.       No defomity, effusion or tenderness to palpation of left shoulder, tenderness to palpation in soft tissue of chest wall anterior to the left shoulder, no joint effusion of the left knee, no ligament instability, left quad tendon is intact, snuff box tenderness on left, no deformity or abnormality   Neurological: She is alert and oriented to person, place, and time.  Skin: Skin is warm and dry.  Psychiatric: She has a normal mood and affect. Her behavior is normal.    ED Course  Procedures (including critical care time)  DIAGNOSTIC STUDIES: Oxygen Saturation is 100% on room air, normal by my interpretation.    COORDINATION OF CARE: 5:23PM-Discussed x-ray of left hand and one dose of Vicodin as treatment plan with pt and pt agreed. Advised pt to use naproxen for knee pain (pt states that she has some naproxen left over from when she was prescribed it for fibromyalgia.) and will give referral to Orthopedist [name] for pt to follow up with.  6:10PM-Informed pt of possible abnormality of scaphoid upon x-ray and discussed CT scan of hand with pt and pt agreed.   Labs Reviewed - No data to display Dg Wrist Navic Only Left  04/05/2012  *RADIOLOGY REPORT*  Clinical Data: Persistent wrist pain.  DG WRIST NAVIC ONLY LEFT  Comparison: 03/31/2012  Findings: Subtle lucency in the mid to distal pole of the scaphoid is probably incidental or from spurring, but given the persistent pain raises concern for nondisplaced fracture.  A subcortical lucency along the ulnar side of the lunate is likely a degenerative subcortical cyst or geode.  There is some positive ulnar variance and this could reflect mild ulnolunate abutment.  IMPRESSION:  1.  Although a definite fracture of the scaphoid is not visible, there is some very faint linear lucency which could reflect a subtle fracture in the mid to distal pole.  CT or  MRI of the wrist is recommended for further workup. 2.  Subcortical cyst or geode in the lunate side of the ulnar side of the lunate.  This is probably an incidental geode but ulnolunate abutment is not completely excluded.  Original Report Authenticated By: Dellia Cloud, M.D.   Ct Wrist Left Wo Contrast  04/05/2012  *RADIOLOGY REPORT*  Clinical Data: Persistent left wrist pain.  CT OF THE LEFT WRIST WITHOUT CONTRAST  Technique:  Multidetector CT imaging was performed according to the standard protocol. Multiplanar CT image reconstructions were also generated.  Comparison: 04/05/2012 radiographs  Findings: No fracture of the scaphoid, carpus, or remaining visualized metacarpals noted.  No radial fracture  is observed.  No obvious abnormal fluid surrounding the flexor or extensor tendons noted.  IMPRESSION:  1.  No fracture or acute bony findings noted.  Original Report Authenticated By: Dellia Cloud, M.D.     1. Musculoskeletal pain       MDM  Patient was evaluated by myself. Previous evaluation had been complete and the patient had seen an orthopedist that she was referred to. Patient complained of left knee pain but denied any additional trauma. She had no findings to suggest that repeat imaging should be performed. Patient while here was given 2 tabs of Vicodin for her pain. Patient did have pain over the left snuff box and plain film of this area was performed. There was questionable abnormality and radiology recommended CT of the wrists. Given patient's difficulty with followup and the fact that she had already seen an orthopedist I did perform CT of the wrist today. This was negative. Patient was notified of the results. Given that she had difficulty with orthopedics she was previously referred to and she has no bony injury I did refer her to Dr. Norton Blizzard sports medicine. She can also followup with her primary care provider and see the orthopedist the she recommends instead.  Patient was discharged with a prescription for tramadol. She was also told to use naproxen. Patient told me that this did not work for her fibromyalgia. We discussed that this pain was for an acute injury and naproxen would help with her inflammation. Patient said she had plenty to take at home and would take this as well. She was discharged in good condition.      I personally performed the services described in this documentation, which was scribed in my presence. The recorded information has been reviewed and considered.      [name], MD 04/05/12 [phone]

## 2012-04-05 NOTE — Discharge Instructions (Signed)
Musculoskeletal Pain   Musculoskeletal pain is muscle and boney aches and pains. These pains can occur in any part of the body. Your caregiver may treat you without knowing the cause of the pain. They may treat you if blood or urine tests, X-rays, and other tests were normal.   CAUSES   There is often not a definite cause or reason for these pains. These pains may be caused by a type of germ (virus). The discomfort may also come from overuse. Overuse includes working out too hard when your body is not fit. Boney aches also come from weather changes. Bone is sensitive to atmospheric pressure changes.   HOME CARE INSTRUCTIONS   Ask when your test results will be ready. Make sure you get your test results.   Only take over-the-counter or prescription medicines for pain, discomfort, or fever as directed by your caregiver. If you were given medications for your condition, do not drive, operate machinery or power tools, or sign legal documents for 24 hours. Do not drink alcohol. Do not take sleeping pills or other medications that may interfere with treatment.   Continue all activities unless the activities cause more pain. When the pain lessens, slowly resume normal activities. Gradually increase the intensity and duration of the activities or exercise.   During periods of severe pain, bed rest may be helpful. Lay or sit in any position that is comfortable.   Putting ice on the injured area.   Put ice in a bag.   Place a towel between your skin and the bag.   Leave the ice on for 15 to 20 minutes, 3 to 4 times a day.   Follow up with your caregiver for continued problems and no reason can be found for the pain. If the pain becomes worse or does not go away, it may be necessary to repeat tests or do additional testing. Your caregiver may need to look further for a possible cause.   SEEK IMMEDIATE MEDICAL CARE IF:   You have pain that is getting worse and is not relieved by medications.   You develop chest pain that is  associated with shortness or breath, sweating, feeling sick to your stomach (nauseous), or throw up (vomit).   Your pain becomes localized to the abdomen.   You develop any new symptoms that seem different or that concern you.   MAKE SURE YOU:   Understand these instructions.   Will watch your condition.   Will get help right away if you are not doing well or get worse.   Document Released: 11/17/2005 Document Revised: 11/06/2011 Document Reviewed: 07/07/2008   [employer]® Patient Information ©2012 [employer].

## 2012-04-05 NOTE — ED Notes (Signed)
Seen at [facility]  5/1 for same, left knee cont with pain , also saw ortho today and removed leg immobilizer

## 2012-04-05 NOTE — ED Notes (Signed)
While performing knee assessment pt states that she is in severe pain, and is unable to dorsiflex foot d/t knee pain.  Pt also refused to flex knee joint.  Pt is angry that orthopedist removed knee immobilizer and did not give her any pain medication.  Pt states that she called her PCP and they told her to come to ER, have knee re-immobilized, and follow up with her so that she can be referred to another orthopedic group.  Pt was angry with this RN because I had asked her to flex knee joint and dorsiflex and plantarflex her foot.

## 2012-07-16 ENCOUNTER — Emergency Department (HOSPITAL_BASED_OUTPATIENT_CLINIC_OR_DEPARTMENT_OTHER)
Admission: EM | Admit: 2012-07-16 | Discharge: 2012-07-16 | Disposition: A | Discharge status: Home / Self Care | Payer: Medicare Other | Attending: Emergency Medicine | Admitting: Emergency Medicine

## 2012-07-16 ENCOUNTER — Encounter (HOSPITAL_BASED_OUTPATIENT_CLINIC_OR_DEPARTMENT_OTHER): Payer: Self-pay | Admitting: *Deleted

## 2012-07-16 DIAGNOSIS — F341 Dysthymic disorder: Secondary | ICD-10-CM | POA: Insufficient documentation

## 2012-07-16 DIAGNOSIS — Z9089 Acquired absence of other organs: Secondary | ICD-10-CM | POA: Insufficient documentation

## 2012-07-16 DIAGNOSIS — J45909 Unspecified asthma, uncomplicated: Secondary | ICD-10-CM | POA: Insufficient documentation

## 2012-07-16 DIAGNOSIS — F17200 Nicotine dependence, unspecified, uncomplicated: Secondary | ICD-10-CM | POA: Insufficient documentation

## 2012-07-16 DIAGNOSIS — R197 Diarrhea, unspecified: Secondary | ICD-10-CM | POA: Insufficient documentation

## 2012-07-16 LAB — CBC WITH DIFFERENTIAL/PLATELET
Eosinophils Relative: 2 % (ref 0–5)
HCT: 38.3 % (ref 36.0–46.0)
Lymphocytes Relative: 27 % (ref 12–46)
Lymphs Abs: 2.8 10*3/uL (ref 0.7–4.0)
MCV: 87.8 fL (ref 78.0–100.0)
Monocytes Absolute: 0.5 10*3/uL (ref 0.1–1.0)
Neutro Abs: 6.8 10*3/uL (ref 1.7–7.7)
Platelets: 224 10*3/uL (ref 150–400)
RBC: 4.36 MIL/uL (ref 3.87–5.11)
WBC: 10.4 10*3/uL (ref 4.0–10.5)

## 2012-07-16 LAB — COMPREHENSIVE METABOLIC PANEL
ALT: 19 U/L (ref 0–35)
CO2: 26 mEq/L (ref 19–32)
Calcium: 10.5 mg/dL (ref 8.4–10.5)
Chloride: 101 mEq/L (ref 96–112)
GFR calc Af Amer: 78 mL/min — ABNORMAL LOW (ref >90)
GFR calc non Af Amer: 67 mL/min — ABNORMAL LOW (ref >90)
Glucose, Bld: 71 mg/dL (ref 70–99)
Sodium: 137 mEq/L (ref 135–145)
Total Bilirubin: 0.2 mg/dL — ABNORMAL LOW (ref 0.3–1.2)

## 2012-07-16 MED ORDER — METRONIDAZOLE 500 MG PO TABS: 500.0000 mg | ORAL_TABLET | Freq: Three times a day (TID) | ORAL | Status: AC

## 2012-07-16 MED ORDER — SODIUM CHLORIDE 0.9 % IV SOLN
Freq: Once | INTRAVENOUS | Status: AC
  Administered 2012-07-16: 18:00:00 via INTRAVENOUS

## 2012-07-16 MED ORDER — KETOROLAC TROMETHAMINE 30 MG/ML IJ SOLN
30.0000 mg | Freq: Once | INTRAMUSCULAR | Status: AC
Start: 2012-07-16 — End: 2012-07-16
  Administered 2012-07-16: 30 mg via INTRAVENOUS
  Filled 2012-07-16: qty 1

## 2012-07-16 MED ORDER — ONDANSETRON HCL 4 MG/2ML IJ SOLN
4.0000 mg | Freq: Once | INTRAMUSCULAR | Status: AC
  Administered 2012-07-16: 4 mg via INTRAVENOUS
  Filled 2012-07-16: qty 2

## 2012-07-16 MED ORDER — PROMETHAZINE HCL 25 MG PO TABS: 25.0000 mg | ORAL_TABLET | Freq: Four times a day (QID) | ORAL | Status: DC | PRN

## 2012-07-16 NOTE — ED Notes (Signed)
Watery black diarrhea x 1 week. Weakness. Chills. No vomiting.

## 2012-07-16 NOTE — ED Notes (Signed)
Pt reports diarrhea since Saturday with constant nausea.  Pt reports "chills" starting today.  Pt states she worries she is dehydrated because she is unable to tolerate any foods or liquids.  Pt states she has chronic abdominal pain and cannot relate any increased pain specifically. Pt reports abdomen is now tender to the touch.

## 2012-07-16 NOTE — ED Notes (Signed)
MD at bedside speaking with pt.

## 2012-07-16 NOTE — ED Provider Notes (Signed)
History     CSN: [id number]  Arrival date & time 07/16/12  1707   First MD Initiated Contact with Patient 07/16/12 1748      Chief Complaint  Patient presents with  . Diarrhea    (Consider location/radiation/quality/duration/timing/severity/associated sxs/prior treatment) Patient is a 46 y.o. female presenting with diarrhea. The history is provided by the patient.  Diarrhea The primary symptoms include fatigue, nausea and diarrhea. Primary symptoms do not include fever, abdominal pain or vomiting. Episode onset: for the past 5 days. The problem has been gradually worsening.  The illness is also significant for chills. Associated medical issues comments: appendectomy in the past.    Past Medical History  Diagnosis Date  . Asthma   . Anxiety   . Depression     Past Surgical History  Procedure Date  . Back surgery   . Abdominal hysterectomy   . Ovarian cyst removal   . Appendectomy     No family history on file.  History  Substance Use Topics  . Smoking status: Current Everyday Smoker  . Smokeless tobacco: Not on file  . Alcohol Use: No    OB History    Grav Para Term Preterm Abortions TAB SAB Ect Mult Living                  Review of Systems  Constitutional: Positive for chills and fatigue. Negative for fever.  Gastrointestinal: Positive for nausea and diarrhea. Negative for vomiting and abdominal pain.  All other systems reviewed and are negative.    Allergies  Aspirin and Lithium  Home Medications   Current Outpatient Rx  Name Route Sig Dispense Refill  . ALPRAZOLAM 1 MG PO TABS Oral Take 1 mg by mouth 3 (three) times daily.     Marland Kitchen ESTRADIOL 1 MG PO TABS Oral Take 2 mg by mouth daily.    Marland Kitchen OMEPRAZOLE 20 MG PO CPDR Oral Take 20 mg by mouth daily.      BP 117/79  Pulse 81  Temp 98.6 F (37 C) (Oral)  Resp 18  SpO2 100%  Physical Exam  Nursing note and vitals reviewed. Constitutional: She is oriented to person, place, and time. She appears  well-developed and well-nourished. No distress.  HENT:  Head: Normocephalic and atraumatic.  Neck: Normal range of motion. Neck supple.  Cardiovascular: Normal rate and regular rhythm.  Exam reveals no gallop and no friction rub.   No murmur heard. Pulmonary/Chest: Effort normal and breath sounds normal. No respiratory distress. She has no wheezes.  Abdominal: Soft. Bowel sounds are normal. She exhibits no distension. There is no tenderness.  Musculoskeletal: Normal range of motion.  Neurological: She is alert and oriented to person, place, and time.  Skin: Skin is warm and dry. She is not diaphoretic.    ED Course  Procedures (including critical care time)  Labs Reviewed - No data to display No results found.   No diagnosis found.    MDM  The patient presents with n/v/d for the past 5 days.  Her abdominal exam is benign and the labs are reassuring.  I suspect viral etiology, however if not improving in the next 24-48 hours I will prescribe flagyl.  She will return for bloody stool, high fever, or severe abdominal pain.        Geoffery Lyons, MD 07/16/12 [phone]

## 2012-10-16 ENCOUNTER — Emergency Department (HOSPITAL_BASED_OUTPATIENT_CLINIC_OR_DEPARTMENT_OTHER): Payer: Medicare Other

## 2012-10-16 ENCOUNTER — Emergency Department (HOSPITAL_BASED_OUTPATIENT_CLINIC_OR_DEPARTMENT_OTHER)
Admission: EM | Admit: 2012-10-16 | Discharge: 2012-10-16 | Disposition: A | Discharge status: Home / Self Care | Payer: Medicare Other | Attending: Emergency Medicine | Admitting: Emergency Medicine

## 2012-10-16 ENCOUNTER — Encounter (HOSPITAL_BASED_OUTPATIENT_CLINIC_OR_DEPARTMENT_OTHER): Payer: Self-pay | Admitting: *Deleted

## 2012-10-16 DIAGNOSIS — F3289 Other specified depressive episodes: Secondary | ICD-10-CM | POA: Insufficient documentation

## 2012-10-16 DIAGNOSIS — R059 Cough, unspecified: Secondary | ICD-10-CM

## 2012-10-16 DIAGNOSIS — R05 Cough: Secondary | ICD-10-CM

## 2012-10-16 DIAGNOSIS — R112 Nausea with vomiting, unspecified: Secondary | ICD-10-CM

## 2012-10-16 DIAGNOSIS — F329 Major depressive disorder, single episode, unspecified: Secondary | ICD-10-CM | POA: Insufficient documentation

## 2012-10-16 DIAGNOSIS — F17200 Nicotine dependence, unspecified, uncomplicated: Secondary | ICD-10-CM | POA: Insufficient documentation

## 2012-10-16 DIAGNOSIS — J45909 Unspecified asthma, uncomplicated: Secondary | ICD-10-CM | POA: Insufficient documentation

## 2012-10-16 DIAGNOSIS — R52 Pain, unspecified: Secondary | ICD-10-CM | POA: Insufficient documentation

## 2012-10-16 DIAGNOSIS — R197 Diarrhea, unspecified: Secondary | ICD-10-CM

## 2012-10-16 DIAGNOSIS — IMO0001 Reserved for inherently not codable concepts without codable children: Secondary | ICD-10-CM | POA: Insufficient documentation

## 2012-10-16 DIAGNOSIS — R109 Unspecified abdominal pain: Secondary | ICD-10-CM | POA: Insufficient documentation

## 2012-10-16 DIAGNOSIS — R5381 Other malaise: Secondary | ICD-10-CM | POA: Insufficient documentation

## 2012-10-16 DIAGNOSIS — R0789 Other chest pain: Secondary | ICD-10-CM

## 2012-10-16 DIAGNOSIS — F411 Generalized anxiety disorder: Secondary | ICD-10-CM | POA: Insufficient documentation

## 2012-10-16 LAB — URINALYSIS, ROUTINE W REFLEX MICROSCOPIC
Bilirubin Urine: NEGATIVE
Glucose, UA: NEGATIVE mg/dL
Ketones, ur: NEGATIVE mg/dL
Leukocytes, UA: NEGATIVE
Nitrite: NEGATIVE
Protein, ur: NEGATIVE mg/dL
Specific Gravity, Urine: 1.022 (ref 1.005–1.030)
Urobilinogen, UA: 1 mg/dL (ref 0.0–1.0)
pH: 6.5 (ref 5.0–8.0)

## 2012-10-16 LAB — URINE MICROSCOPIC-ADD ON

## 2012-10-16 MED ORDER — METOCLOPRAMIDE HCL 5 MG/ML IJ SOLN
10.0000 mg | Freq: Once | INTRAMUSCULAR | Status: AC
  Administered 2012-10-16: 10 mg via INTRAVENOUS
  Filled 2012-10-16: qty 2

## 2012-10-16 MED ORDER — FENTANYL CITRATE 0.05 MG/ML IJ SOLN
50.0000 ug | Freq: Once | INTRAMUSCULAR | Status: DC
  Filled 2012-10-16: qty 2

## 2012-10-16 MED ORDER — ONDANSETRON HCL 4 MG/2ML IJ SOLN
4.0000 mg | Freq: Once | INTRAMUSCULAR | Status: AC
  Administered 2012-10-16: 4 mg via INTRAVENOUS

## 2012-10-16 MED ORDER — HYDROCOD POLST-CHLORPHEN POLST 10-8 MG/5ML PO LQCR: 5.0000 mL | Freq: Two times a day (BID) | ORAL | Status: DC | PRN

## 2012-10-16 MED ORDER — SODIUM CHLORIDE 0.9 % IV BOLUS (SEPSIS)
1000.0000 mL | Freq: Once | INTRAVENOUS | Status: AC
  Administered 2012-10-16: 1000 mL via INTRAVENOUS

## 2012-10-16 MED ORDER — ONDANSETRON HCL 4 MG/2ML IJ SOLN
4.0000 mg | Freq: Once | INTRAMUSCULAR | Status: AC
  Administered 2012-10-16: 4 mg via INTRAVENOUS
  Filled 2012-10-16: qty 2

## 2012-10-16 MED ORDER — PENICILLIN V POTASSIUM 250 MG PO TABS
ORAL_TABLET | ORAL | Status: AC
  Filled 2012-10-16: qty 1

## 2012-10-16 MED ORDER — LOPERAMIDE HCL 2 MG PO CAPS: 2.0000 mg | ORAL_CAPSULE | Freq: Four times a day (QID) | ORAL | Status: DC | PRN

## 2012-10-16 MED ORDER — ONDANSETRON HCL 4 MG/2ML IJ SOLN
INTRAMUSCULAR | Status: AC
  Administered 2012-10-16: 4 mg via INTRAVENOUS
  Filled 2012-10-16: qty 2

## 2012-10-16 MED ORDER — PANTOPRAZOLE SODIUM 40 MG IV SOLR
40.0000 mg | Freq: Once | INTRAVENOUS | Status: AC
  Administered 2012-10-16: 40 mg via INTRAVENOUS
  Filled 2012-10-16: qty 40

## 2012-10-16 MED ORDER — METOCLOPRAMIDE HCL 10 MG PO TABS: 10.0000 mg | ORAL_TABLET | Freq: Four times a day (QID) | ORAL | Status: DC | PRN

## 2012-10-16 MED ORDER — LOPERAMIDE HCL 2 MG PO CAPS
4.0000 mg | ORAL_CAPSULE | Freq: Once | ORAL | Status: AC
  Administered 2012-10-16: 4 mg via ORAL
  Filled 2012-10-16: qty 2

## 2012-10-16 NOTE — ED Notes (Signed)
Diarrhea x 3-4 days. Denies other s/s.

## 2012-10-16 NOTE — ED Notes (Signed)
Pt vomited after PO trial.  Dr. Oletta Lamas notified.

## 2012-10-16 NOTE — ED Notes (Signed)
Pt unable to keep fluid down at this time. Vomiting X 2 after fluid challenge.

## 2012-10-16 NOTE — ED Provider Notes (Addendum)
History  This chart was scribed for Gavin Pound. Oletta Lamas, MD by Thad Ranger, ED Scribe. This patient was seen in room MH12/MH12 and the patient's care was started at 3:11 PM.   CSN: [id number]  Arrival date & time 10/16/12  1321   None     Chief Complaint  Patient presents with  . Diarrhea    The history is provided by the patient. No language interpreter was used.   Brittney Sanford is a 46 y.o. female with a history of abdominal hysterectomy, appendectomy, and ovarian cystectomy, who presents to the Emergency Department complaining of moderate to severe, intermittent diarrhea onset 3 days ago described as loose stool. Frequency is every few hours. There is associated nausea, vomiting, and gerneralized bodyache. She denies fever, chills, and abdominal pain.  Patient reports taking OTC with minimal relief. She also complains of gradually worsening non- productive cough onset a week ago that is associated with moderate chest pain. Chest pain is aggravated by coughing. She denies taking any medication for it. She has not been outside the country and has not been in contact with a sick person. Patient is a current everyday smoker but denies alcohol use.   Past Medical History  Diagnosis Date  . Asthma   . Anxiety   . Depression     Past Surgical History  Procedure Date  . Back surgery   . Abdominal hysterectomy   . Ovarian cyst removal   . Appendectomy     History reviewed. No pertinent family history.  History  Substance Use Topics  . Smoking status: Current Every Day Smoker  . Smokeless tobacco: Not on file  . Alcohol Use: No   No OB history provided.  Review of Systems  Constitutional: Positive for appetite change and fatigue. Negative for fever and chills.  Respiratory: Positive for cough.   Cardiovascular: Positive for chest pain.  Gastrointestinal: Positive for nausea, vomiting, abdominal pain and diarrhea. Negative for constipation and blood in stool.    Genitourinary: Negative for dysuria and flank pain.  Musculoskeletal: Positive for myalgias. Negative for back pain.  Skin: Negative for rash.  Neurological: Positive for weakness. Negative for dizziness, numbness and headaches.  All other systems reviewed and are negative.    Allergies  Aspirin and Lithium  Home Medications   Current Outpatient Rx  Name  Route  Sig  Dispense  Refill  . ALPRAZOLAM 1 MG PO TABS   Oral   Take 1 mg by mouth 3 (three) times daily.          Marland Kitchen ESTRADIOL 1 MG PO TABS   Oral   Take 2 mg by mouth daily.         Marland Kitchen LOPERAMIDE HCL 2 MG PO CAPS   Oral   Take 1 capsule (2 mg total) by mouth 4 (four) times daily as needed for diarrhea or loose stools.   12 capsule   0   . METOCLOPRAMIDE HCL 10 MG PO TABS   Oral   Take 1 tablet (10 mg total) by mouth every 6 (six) hours.   30 tablet   0   . METOCLOPRAMIDE HCL 10 MG PO TABS   Oral   Take 1 tablet (10 mg total) by mouth 4 (four) times daily as needed (for nausea).   20 tablet   0   . OMEPRAZOLE 20 MG PO CPDR   Oral   Take 20 mg by mouth daily.         Marland Kitchen  PROMETHAZINE HCL 25 MG PO TABS   Oral   Take 1 tablet (25 mg total) by mouth every 6 (six) hours as needed for nausea.   10 tablet   1     Pulse 80  Temp 98.3 F (36.8 C) (Oral)  Resp 20  Ht 5' 4.5" (1.638 m)  Wt 229 lb (103.874 kg)  BMI 38.70 kg/m2  SpO2 99%  Physical Exam  Nursing note and vitals reviewed. Constitutional: She is oriented to person, place, and time. She appears well-developed and well-nourished. No distress.  HENT:  Head: Normocephalic and atraumatic.  Right Ear: External ear normal.  Left Ear: External ear normal.  Mouth/Throat: Oropharynx is clear and moist.  Eyes: Conjunctivae normal and EOM are normal. Pupils are equal, round, and reactive to light.  Neck: Normal range of motion. Neck supple.  Cardiovascular: Normal rate, regular rhythm and normal heart sounds.   No murmur heard. Pulmonary/Chest:  Effort normal and breath sounds normal. No respiratory distress. She has no wheezes. She exhibits tenderness. She exhibits no edema, no deformity and no retraction.  Abdominal: Soft. Bowel sounds are normal. She exhibits no distension and no mass. There is no tenderness. There is no rebound and no guarding.  Musculoskeletal: Normal range of motion. She exhibits no edema.  Lymphadenopathy:    She has no cervical adenopathy.  Neurological: She is alert and oriented to person, place, and time.  Skin: Skin is warm and dry. No rash noted. No erythema.    ED Course  Procedures (including critical care time)  DIAGNOSTIC STUDIES: Oxygen Saturation is 99% on room air, normal by my interpretation.    COORDINATION OF CARE: 3:14 PM Discussed treatment plan with pt at bedside and pt agreed to plan.   Labs Reviewed  URINALYSIS, ROUTINE W REFLEX MICROSCOPIC - Abnormal; Notable for the following:    Hgb urine dipstick MODERATE (*)     All other components within normal limits  URINE MICROSCOPIC-ADD ON - Abnormal; Notable for the following:    Squamous Epithelial / LPF FEW (*)     Bacteria, UA MANY (*)     All other components within normal limits   Dg Chest Port 1 View  10/16/2012  *RADIOLOGY REPORT*  Clinical Data: Cough, chest pain, shortness of breath  PORTABLE CHEST - 1 VIEW  Comparison: 11/03/2011  Findings: Cardiomegaly with suspected mild interstitial edema. Atypical/viral infection is also possible.  No pleural effusion or pneumothorax.  IMPRESSION: Cardiomegaly with suspected mild interstitial edema. Atypical/viral infection is also possible.   Original Report Authenticated By: Charline Bills, M.D.      1. Nausea vomiting and diarrhea   2. Cough   3. Musculoskeletal chest pain      ECG at time 16:38 shows NSR at rate 57, borderline poor r wave progression, normal axis, normal intervals, no ST or T wave abn's.  No prior ECG available.  Interpretation of borderline ECG.  I reviewed  portable 1 view CXR myself and interpret as no infiltrates, no free air, no rib fractures.      5:33 PM With initial PO challenge, pt had vomiting.  Will try reglan and continue IVF's and continue to monitor.    6:34 PM Pt reports IV reglan improved nausea, feels improved, desires to go home.  Abd is soft.    MDM  I personally performed the services described in this documentation, which was scribed in my presence. The recorded information has been reviewed and is accurate.   Pt's primary  complaint is diarrhea.  abd is soft, no guard or rebound.  No fever.  Pt has had prior surgeries, but no vomiting, doubt obstruction.  Also pt has some chronic abd pain, reports it is not changed due to her recent symptoms.  Pt's CP is atypical and with her diarrhea and cough, CP is more likely related to coughing and therefore musculoskeletal and also some contribution from reflux.  Pt given IVF's, zofran, protonix, fentanyl.  If pt feels improved, can tolerate PO's, will be able to go home.  I suspect viral process.  UA is contaminated, not likely to be UTI.                           Gavin Pound. Oletta Lamas, MD 10/16/12 1610  Gavin Pound. Oletta Lamas, MD 10/16/12 [phone]

## 2012-10-16 NOTE — ED Notes (Signed)
Pt feeling better.  Keeping PO fluids down.  Dr. Oletta Lamas notified.

## 2013-10-21 ENCOUNTER — Emergency Department (HOSPITAL_BASED_OUTPATIENT_CLINIC_OR_DEPARTMENT_OTHER)
Admission: EM | Admit: 2013-10-21 | Discharge: 2013-10-21 | Disposition: A | Discharge status: Home / Self Care | Payer: Medicare Other | Attending: Emergency Medicine | Admitting: Emergency Medicine

## 2013-10-21 ENCOUNTER — Encounter (HOSPITAL_BASED_OUTPATIENT_CLINIC_OR_DEPARTMENT_OTHER): Payer: Self-pay | Admitting: Emergency Medicine

## 2013-10-21 ENCOUNTER — Emergency Department (HOSPITAL_BASED_OUTPATIENT_CLINIC_OR_DEPARTMENT_OTHER): Payer: Medicare Other

## 2013-10-21 DIAGNOSIS — J029 Acute pharyngitis, unspecified: Secondary | ICD-10-CM | POA: Insufficient documentation

## 2013-10-21 DIAGNOSIS — F17200 Nicotine dependence, unspecified, uncomplicated: Secondary | ICD-10-CM | POA: Insufficient documentation

## 2013-10-21 DIAGNOSIS — F411 Generalized anxiety disorder: Secondary | ICD-10-CM | POA: Insufficient documentation

## 2013-10-21 DIAGNOSIS — F3289 Other specified depressive episodes: Secondary | ICD-10-CM | POA: Insufficient documentation

## 2013-10-21 DIAGNOSIS — J45909 Unspecified asthma, uncomplicated: Secondary | ICD-10-CM | POA: Insufficient documentation

## 2013-10-21 DIAGNOSIS — F329 Major depressive disorder, single episode, unspecified: Secondary | ICD-10-CM | POA: Insufficient documentation

## 2013-10-21 DIAGNOSIS — Z79899 Other long term (current) drug therapy: Secondary | ICD-10-CM | POA: Insufficient documentation

## 2013-10-21 DIAGNOSIS — R05 Cough: Secondary | ICD-10-CM

## 2013-10-21 DIAGNOSIS — R059 Cough, unspecified: Secondary | ICD-10-CM

## 2013-10-21 MED ORDER — BENZONATATE 100 MG PO CAPS: 100.0000 mg | ORAL_CAPSULE | Freq: Three times a day (TID) | ORAL | Status: DC

## 2013-10-21 MED ORDER — HYDROCOD POLST-CHLORPHEN POLST 10-8 MG/5ML PO LQCR: 5.0000 mL | Freq: Two times a day (BID) | ORAL | Status: DC

## 2013-10-21 NOTE — ED Notes (Signed)
Patient transported to X-ray ambulatory with tech. 

## 2013-10-21 NOTE — ED Notes (Signed)
Cough x 3 months

## 2013-10-21 NOTE — ED Provider Notes (Signed)
CSN: [id number]     Arrival date & time 10/21/13  2030 History  This chart was scribed for Roney Marion, MD by Ardelia Mems, ED Scribe. This patient was seen in room MH03/MH03 and the patient's care was started at 9:04 PM.   Chief Complaint  Patient presents with  . Cough    The history is provided by the patient. No language interpreter was used.    HPI Comments: Brittney Sanford is a 47 y.o. Female with a history of asthma who presents to the Emergency Department complaining of a persistent cough over the past 2 to 2.5 months. She states that the cough has been present every day and that the cough is worst at night. She states that she had a cold and a sinus infection prior to the onset of her cough. She states that her sinus symptoms and cold symptoms have subsided, and that she completed an antibiotic course. She states that she has a history of similar coughing in the past, and that she normally gets a cough that isn't this persistent after getting over a cold. She states that she has not been coughing up any blood. She states that she has not seen her PCP regarding these symptoms. She states that her throat hurts due to the cough. She denies wheezing. She has an inhaler but has not felt the need to use it. She denies sinus pressure, postnasal drip, congestion, SOB, abdominal pain, ear pain, swelling in her hands or feet, or any other symptoms. She states that she is a current every day smoker of 0.5 packs/day over the past 35 years.   Past Medical History  Diagnosis Date  . Asthma   . Anxiety   . Depression    Past Surgical History  Procedure Laterality Date  . Back surgery    . Abdominal hysterectomy    . Ovarian cyst removal    . Appendectomy     No family history on file. History  Substance Use Topics  . Smoking status: Current Every Day Smoker -- 0.50 packs/day    Types: Cigarettes  . Smokeless tobacco: Not on file  . Alcohol Use: No   OB History   Grav Para Term  Preterm Abortions TAB SAB Ect Mult Living                 Review of Systems  Constitutional: Negative for appetite change and fatigue.  HENT: Positive for sore throat (from cpughing). Negative for congestion, ear pain, mouth sores, postnasal drip, sinus pressure and trouble swallowing.   Eyes: Negative for visual disturbance.  Respiratory: Positive for cough. Negative for chest tightness, shortness of breath and wheezing.   Cardiovascular: Negative for chest pain and leg swelling.  Gastrointestinal: Negative for nausea, vomiting, abdominal pain, diarrhea and abdominal distention.  Endocrine: Negative for polydipsia, polyphagia and polyuria.  Genitourinary: Negative for dysuria, frequency and hematuria.  Musculoskeletal: Negative for gait problem.  Skin: Negative for color change and pallor.  Neurological: Negative for dizziness, syncope and light-headedness.  Hematological: Does not bruise/bleed easily.  Psychiatric/Behavioral: Negative for behavioral problems and confusion.  All other systems reviewed and are negative.   Allergies  Aspirin and Lithium  Home Medications   Current Outpatient Rx  Name  Route  Sig  Dispense  Refill  . ALPRAZolam (XANAX) 1 MG tablet   Oral   Take 1 mg by mouth 3 (three) times daily.          . benzonatate (TESSALON) 100 MG  capsule   Oral   Take 1 capsule (100 mg total) by mouth every 8 (eight) hours.   21 capsule   0   . chlorpheniramine-HYDROcodone (TUSSIONEX PENNKINETIC ER) 10-8 MG/5ML LQCR   Oral   Take 5 mLs by mouth every 12 (twelve) hours as needed.   80 mL   0   . chlorpheniramine-HYDROcodone (TUSSIONEX PENNKINETIC ER) 10-8 MG/5ML LQCR   Oral   Take 5 mLs by mouth every 12 (twelve) hours.   60 mL   0   . estradiol (ESTRACE) 1 MG tablet   Oral   Take 2 mg by mouth daily.         Marland Kitchen loperamide (IMODIUM) 2 MG capsule   Oral   Take 1 capsule (2 mg total) by mouth 4 (four) times daily as needed for diarrhea or loose stools.    12 capsule   0   . EXPIRED: metoCLOPramide (REGLAN) 10 MG tablet   Oral   Take 1 tablet (10 mg total) by mouth every 6 (six) hours.   30 tablet   0   . metoCLOPramide (REGLAN) 10 MG tablet   Oral   Take 1 tablet (10 mg total) by mouth 4 (four) times daily as needed (for nausea).   20 tablet   0   . omeprazole (PRILOSEC) 20 MG capsule   Oral   Take 20 mg by mouth daily.         Marland Kitchen EXPIRED: promethazine (PHENERGAN) 25 MG tablet   Oral   Take 1 tablet (25 mg total) by mouth every 6 (six) hours as needed for nausea.   10 tablet   1    Triage Vitals: BP 116/81  Pulse 96  Temp(Src) 97.9 F (36.6 C) (Oral)  Resp 20  Ht 5\' 4"  (1.626 m)  Wt 242 lb (109.77 kg)  BMI 41.52 kg/m2  SpO2 97%  Physical Exam  Constitutional: She is oriented to person, place, and time. She appears well-developed and well-nourished. No distress.  HENT:  Head: Normocephalic.  Posterior pharynx is not erythematous. Bilateral TMs appear normal.  Eyes: Conjunctivae are normal. Pupils are equal, round, and reactive to light. No scleral icterus.  Neck: Normal range of motion. Neck supple. No thyromegaly present.  Cardiovascular: Normal rate and regular rhythm.  Exam reveals no gallop and no friction rub.   No murmur heard. Pulmonary/Chest: Effort normal and breath sounds normal. No respiratory distress. She has no wheezes. She has no rales.  Occasional cough.  Abdominal: Soft. Bowel sounds are normal. She exhibits no distension. There is no tenderness. There is no rebound.  Musculoskeletal: Normal range of motion.  Neurological: She is alert and oriented to person, place, and time.  Skin: Skin is warm and dry. No rash noted.  Psychiatric: She has a normal mood and affect. Her behavior is normal.    ED Course  Procedures (including critical care time)  DIAGNOSTIC STUDIES: Oxygen Saturation is 97% on RA, normal by my interpretation.    COORDINATION OF CARE: 9:09 PM- Discussed plan to obtain a CXR.   Pt advised of plan for treatment and pt agrees.  Labs Review Labs Reviewed - No data to display Imaging Review Dg Chest 2 View  10/21/2013   CLINICAL DATA:  Cough  EXAM: CHEST  2 VIEW  COMPARISON:  October 16, 2012  FINDINGS: The heart size and mediastinal contours are within normal limits. Both lungs are clear. The visualized skeletal structures are unremarkable.  IMPRESSION: No active cardiopulmonary  disease.   Electronically Signed   By: Sherian Rein M.D.   On: 10/21/2013 21:21    EKG Interpretation   None       MDM   1. Cough    Normal exam. Normal x-ray. No mass. No asymmetry. No fluid. No cardiomegaly no infiltrates. I think simple symmetric as all this indicated. She is not on any offending medications. I have asked her to stop smoking.  No asthma symptoms or findings.   I personally performed the services described in this documentation, which was scribed in my presence. The recorded information has been reviewed and is accurate.    Roney Marion, MD 10/21/13 2147

## 2013-11-19 ENCOUNTER — Encounter (HOSPITAL_BASED_OUTPATIENT_CLINIC_OR_DEPARTMENT_OTHER): Payer: Self-pay | Admitting: Emergency Medicine

## 2013-11-19 ENCOUNTER — Emergency Department (HOSPITAL_BASED_OUTPATIENT_CLINIC_OR_DEPARTMENT_OTHER)
Admission: EM | Admit: 2013-11-19 | Discharge: 2013-11-20 | Disposition: A | Discharge status: Home / Self Care | Payer: Medicare Other | Attending: Emergency Medicine | Admitting: Emergency Medicine

## 2013-11-19 DIAGNOSIS — Z79899 Other long term (current) drug therapy: Secondary | ICD-10-CM | POA: Insufficient documentation

## 2013-11-19 DIAGNOSIS — J45909 Unspecified asthma, uncomplicated: Secondary | ICD-10-CM | POA: Insufficient documentation

## 2013-11-19 DIAGNOSIS — F17200 Nicotine dependence, unspecified, uncomplicated: Secondary | ICD-10-CM | POA: Insufficient documentation

## 2013-11-19 DIAGNOSIS — G43909 Migraine, unspecified, not intractable, without status migrainosus: Secondary | ICD-10-CM

## 2013-11-19 DIAGNOSIS — F329 Major depressive disorder, single episode, unspecified: Secondary | ICD-10-CM | POA: Insufficient documentation

## 2013-11-19 DIAGNOSIS — F3289 Other specified depressive episodes: Secondary | ICD-10-CM | POA: Insufficient documentation

## 2013-11-19 DIAGNOSIS — F411 Generalized anxiety disorder: Secondary | ICD-10-CM | POA: Insufficient documentation

## 2013-11-19 MED ORDER — SODIUM CHLORIDE 0.9 % IV BOLUS (SEPSIS)
1000.0000 mL | Freq: Once | INTRAVENOUS | Status: AC
  Administered 2013-11-19: 1000 mL via INTRAVENOUS

## 2013-11-19 MED ORDER — DIPHENHYDRAMINE HCL 50 MG/ML IJ SOLN
25.0000 mg | Freq: Once | INTRAMUSCULAR | Status: AC
  Administered 2013-11-19: 25 mg via INTRAVENOUS
  Filled 2013-11-19: qty 1

## 2013-11-19 MED ORDER — DEXAMETHASONE SODIUM PHOSPHATE 10 MG/ML IJ SOLN
10.0000 mg | Freq: Once | INTRAMUSCULAR | Status: AC
  Administered 2013-11-19: 10 mg via INTRAVENOUS
  Filled 2013-11-19: qty 1

## 2013-11-19 MED ORDER — KETOROLAC TROMETHAMINE 30 MG/ML IJ SOLN
30.0000 mg | Freq: Once | INTRAMUSCULAR | Status: AC
Start: 2013-11-19 — End: 2013-11-19
  Administered 2013-11-19: 30 mg via INTRAVENOUS
  Filled 2013-11-19: qty 1

## 2013-11-19 MED ORDER — METOCLOPRAMIDE HCL 5 MG/ML IJ SOLN
10.0000 mg | Freq: Once | INTRAMUSCULAR | Status: AC
  Administered 2013-11-19: 10 mg via INTRAVENOUS
  Filled 2013-11-19: qty 2

## 2013-11-19 NOTE — Discharge Instructions (Signed)
Follow up with your doctor as needed. Refer to attached documents for more information. Return to the ED with worsening or concerning symptoms.  °

## 2013-11-19 NOTE — ED Provider Notes (Signed)
CSN: [id number]     Arrival date & time 11/19/13  2055 History   First MD Initiated Contact with Patient 11/19/13 2222     Chief Complaint  Patient presents with  . Migraine   (Consider location/radiation/quality/duration/timing/severity/associated sxs/prior Treatment) HPI Comments: Patient is a 47 year old female with a past medical history of chronic migraines who presents with a headache for 4 days. Patient reports a gradual onset and progressive worsening of the headache. The pain is sharp, constant and is located in generalized head without radiation. Patient has tried OTC medication and Percocet for symptoms without relief. No alleviating/aggravating factors. Patient reports associated nausea and photophobia. Patient denies fever, vomiting, diarrhea, numbness/tingling, weakness, visual changes, congestion, chest pain, SOB, abdominal pain.     Patient is a 47 y.o. female presenting with migraines.  Migraine Associated symptoms include headaches. Pertinent negatives include no abdominal pain, arthralgias, chest pain, chills, fatigue, fever, nausea, neck pain, vomiting or weakness.    Past Medical History  Diagnosis Date  . Asthma   . Anxiety   . Depression    Past Surgical History  Procedure Laterality Date  . Back surgery    . Abdominal hysterectomy    . Ovarian cyst removal    . Appendectomy     No family history on file. History  Substance Use Topics  . Smoking status: Current Every Day Smoker -- 1.00 packs/day    Types: Cigarettes  . Smokeless tobacco: Not on file  . Alcohol Use: No   OB History   Grav Para Term Preterm Abortions TAB SAB Ect Mult Living                 Review of Systems  Constitutional: Negative for fever, chills and fatigue.  HENT: Negative for trouble swallowing.   Eyes: Negative for visual disturbance.  Respiratory: Negative for shortness of breath.   Cardiovascular: Negative for chest pain and palpitations.  Gastrointestinal: Negative for  nausea, vomiting, abdominal pain and diarrhea.  Genitourinary: Negative for dysuria and difficulty urinating.  Musculoskeletal: Negative for arthralgias and neck pain.  Skin: Negative for color change.  Neurological: Positive for headaches. Negative for dizziness and weakness.  Psychiatric/Behavioral: Negative for dysphoric mood.    Allergies  Aspirin and Lithium  Home Medications   Current Outpatient Rx  Name  Route  Sig  Dispense  Refill  . ALPRAZolam (XANAX) 1 MG tablet   Oral   Take 1 mg by mouth 3 (three) times daily.          . benzonatate (TESSALON) 100 MG capsule   Oral   Take 1 capsule (100 mg total) by mouth every 8 (eight) hours.   21 capsule   0   . chlorpheniramine-HYDROcodone (TUSSIONEX PENNKINETIC ER) 10-8 MG/5ML LQCR   Oral   Take 5 mLs by mouth every 12 (twelve) hours as needed.   80 mL   0   . chlorpheniramine-HYDROcodone (TUSSIONEX PENNKINETIC ER) 10-8 MG/5ML LQCR   Oral   Take 5 mLs by mouth every 12 (twelve) hours.   60 mL   0   . estradiol (ESTRACE) 1 MG tablet   Oral   Take 2 mg by mouth daily.         Marland Kitchen loperamide (IMODIUM) 2 MG capsule   Oral   Take 1 capsule (2 mg total) by mouth 4 (four) times daily as needed for diarrhea or loose stools.   12 capsule   0   . EXPIRED: metoCLOPramide (REGLAN) 10  MG tablet   Oral   Take 1 tablet (10 mg total) by mouth every 6 (six) hours.   30 tablet   0   . metoCLOPramide (REGLAN) 10 MG tablet   Oral   Take 1 tablet (10 mg total) by mouth 4 (four) times daily as needed (for nausea).   20 tablet   0   . omeprazole (PRILOSEC) 20 MG capsule   Oral   Take 20 mg by mouth daily.         Marland Kitchen EXPIRED: promethazine (PHENERGAN) 25 MG tablet   Oral   Take 1 tablet (25 mg total) by mouth every 6 (six) hours as needed for nausea.   10 tablet   1    BP 130/86  Pulse 82  Temp(Src) 97.9 F (36.6 C) (Oral)  Resp 18  Ht 5\' 4"  (1.626 m)  Wt 247 lb (112.038 kg)  BMI 42.38 kg/m2  SpO2  100% Physical Exam  Nursing note and vitals reviewed. Constitutional: She is oriented to person, place, and time. She appears well-developed and well-nourished. No distress.  HENT:  Head: Normocephalic and atraumatic.  Eyes: Conjunctivae and EOM are normal. Pupils are equal, round, and reactive to light.  Neck: Normal range of motion.  Cardiovascular: Normal rate and regular rhythm.  Exam reveals no gallop and no friction rub.   No murmur heard. Pulmonary/Chest: Effort normal and breath sounds normal. She has no wheezes. She has no rales. She exhibits no tenderness.  Abdominal: Soft. She exhibits no distension. There is no tenderness. There is no rebound and no guarding.  Musculoskeletal: Normal range of motion.  Neurological: She is alert and oriented to person, place, and time. Coordination normal.  No meningeal signs. Speech is goal-oriented. Moves limbs without ataxia.   Skin: Skin is warm and dry.  Psychiatric: She has a normal mood and affect. Her behavior is normal.    ED Course  Procedures (including critical care time) Labs Review Labs Reviewed - No data to display Imaging Review No results found.  EKG Interpretation   None       MDM   1. Migraine     11:33 PM Patient feeling better after migraine cocktail of fluids, toradol, reglan, and benadryl. Vitals stable and patient afebrile. Patient is ready to go home. No meningeal signs. Patient instructed to return with worsening or concerning symptoms.     Emilia Beck, [place] 11/19/13 2338

## 2013-11-19 NOTE — ED Notes (Signed)
Pt c/o HA x4 days and has tried motrin, alleve, percocet with no relief. Sts her PMD has not diagnosed her with migraines but has HA 2-3 month for the last 3-4 months. She sts the HA usually resolves but this incident has persisted.

## 2013-11-28 NOTE — ED Provider Notes (Signed)
Medical screening examination/treatment/procedure(s) were performed by non-physician practitioner and as supervising physician I was immediately available for consultation/collaboration.  EKG Interpretation   None         William Felipe Paluch, MD 11/28/13 1047 

## 2014-08-11 ENCOUNTER — Encounter (HOSPITAL_BASED_OUTPATIENT_CLINIC_OR_DEPARTMENT_OTHER): Payer: Self-pay | Admitting: Emergency Medicine

## 2014-08-11 ENCOUNTER — Emergency Department (HOSPITAL_BASED_OUTPATIENT_CLINIC_OR_DEPARTMENT_OTHER)
Admission: EM | Admit: 2014-08-11 | Discharge: 2014-08-11 | Disposition: A | Discharge status: Home / Self Care | Payer: Medicare HMO | Attending: Emergency Medicine | Admitting: Emergency Medicine

## 2014-08-11 DIAGNOSIS — J069 Acute upper respiratory infection, unspecified: Secondary | ICD-10-CM

## 2014-08-11 DIAGNOSIS — F3289 Other specified depressive episodes: Secondary | ICD-10-CM | POA: Diagnosis not present

## 2014-08-11 DIAGNOSIS — F411 Generalized anxiety disorder: Secondary | ICD-10-CM | POA: Diagnosis not present

## 2014-08-11 DIAGNOSIS — F329 Major depressive disorder, single episode, unspecified: Secondary | ICD-10-CM | POA: Insufficient documentation

## 2014-08-11 DIAGNOSIS — J45901 Unspecified asthma with (acute) exacerbation: Secondary | ICD-10-CM

## 2014-08-11 DIAGNOSIS — F17200 Nicotine dependence, unspecified, uncomplicated: Secondary | ICD-10-CM | POA: Diagnosis not present

## 2014-08-11 DIAGNOSIS — Z79899 Other long term (current) drug therapy: Secondary | ICD-10-CM | POA: Diagnosis not present

## 2014-08-11 DIAGNOSIS — R05 Cough: Secondary | ICD-10-CM | POA: Diagnosis present

## 2014-08-11 DIAGNOSIS — R059 Cough, unspecified: Secondary | ICD-10-CM | POA: Diagnosis present

## 2014-08-11 MED ORDER — PREDNISONE 10 MG PO TABS
ORAL_TABLET | ORAL | Status: DC
Start: 2014-08-11 — End: 2020-11-21

## 2014-08-11 MED ORDER — HYDROCOD POLST-CHLORPHEN POLST 10-8 MG/5ML PO LQCR: 5.0000 mL | Freq: Two times a day (BID) | ORAL | Status: DC | PRN

## 2014-08-11 NOTE — ED Notes (Signed)
Stuffy nose, scratchy throat, non productive cough, aching all over since yesterday.

## 2014-08-11 NOTE — ED Provider Notes (Signed)
CSN: [id number]     Arrival date & time 08/11/14  1613 History   First MD Initiated Contact with Patient 08/11/14 1739     Chief Complaint  Patient presents with  . URI     (Consider location/radiation/quality/duration/timing/severity/associated sxs/prior Treatment) Patient is a 48 y.o. female presenting with URI. The history is provided by the patient. No language interpreter was used.  URI Presenting symptoms: congestion, cough and rhinorrhea   Severity:  Moderate Onset quality:  Gradual Duration:  2 days Timing:  Constant Progression:  Worsening Chronicity:  New Relieved by:  Nothing Worsened by:  Nothing tried Ineffective treatments:  None tried Associated symptoms: myalgias and sinus pain   Risk factors: no diabetes mellitus     Past Medical History  Diagnosis Date  . Asthma   . Anxiety   . Depression    Past Surgical History  Procedure Laterality Date  . Back surgery    . Abdominal hysterectomy    . Ovarian cyst removal    . Appendectomy     No family history on file. History  Substance Use Topics  . Smoking status: Current Every Day Smoker -- 1.00 packs/day    Types: Cigarettes  . Smokeless tobacco: Not on file  . Alcohol Use: No   OB History   Grav Para Term Preterm Abortions TAB SAB Ect Mult Living                 Review of Systems  HENT: Positive for congestion and rhinorrhea.   Respiratory: Positive for cough.   Musculoskeletal: Positive for myalgias.  All other systems reviewed and are negative.     Allergies  Aspirin and Lithium  Home Medications   Prior to Admission medications   Medication Sig Start Date End Date Taking? Authorizing Provider  ALPRAZolam Prudy Feeler) 1 MG tablet Take 1 mg by mouth 3 (three) times daily.     Historical Provider, MD  benzonatate (TESSALON) 100 MG capsule Take 1 capsule (100 mg total) by mouth every 8 (eight) hours. 10/21/13   Rolland Porter, MD  chlorpheniramine-HYDROcodone [facility] PENNKINETIC ER) 10-8  MG/5ML LQCR Take 5 mLs by mouth every 12 (twelve) hours. 10/21/13   Rolland Porter, MD  chlorpheniramine-HYDROcodone [facility] PENNKINETIC ER) 10-8 MG/5ML LQCR Take 5 mLs by mouth every 12 (twelve) hours as needed. 08/11/14   Elson Areas, PA-C  estradiol (ESTRACE) 1 MG tablet Take 2 mg by mouth daily.    Historical Provider, MD  loperamide (IMODIUM) 2 MG capsule Take 1 capsule (2 mg total) by mouth 4 (four) times daily as needed for diarrhea or loose stools. 10/16/12   Gavin Pound. Ghim, MD  metoCLOPramide (REGLAN) 10 MG tablet Take 1 tablet (10 mg total) by mouth every 6 (six) hours. 06/14/11 06/24/11  Vida Roller, MD  metoCLOPramide (REGLAN) 10 MG tablet Take 1 tablet (10 mg total) by mouth 4 (four) times daily as needed (for nausea). 10/16/12   Gavin Pound. Ghim, MD  omeprazole (PRILOSEC) 20 MG capsule Take 20 mg by mouth daily.    Historical Provider, MD  predniSONE (DELTASONE) 10 MG tablet 6.5.4.3.2.1 taper 08/11/14   Elson Areas, PA-C  promethazine (PHENERGAN) 25 MG tablet Take 1 tablet (25 mg total) by mouth every 6 (six) hours as needed for nausea. 07/16/12 07/23/12  Geoffery Lyons, MD   BP 120/85  Pulse 84  Temp(Src) 98.9 F (37.2 C) (Oral)  Resp 18  Ht 5' 4.5" (1.638 m)  Wt 230 lb (104.327 kg)  BMI 38.88 kg/m2  SpO2 99% Physical Exam  Nursing note and vitals reviewed. Constitutional: She is oriented to person, place, and time. She appears well-developed and well-nourished.  HENT:  Head: Normocephalic.  Right Ear: External ear normal.  Left Ear: External ear normal.  Nose: Nose normal.  Eyes: EOM are normal. Pupils are equal, round, and reactive to light.  Neck: Normal range of motion.  Cardiovascular: Normal rate and normal heart sounds.   Pulmonary/Chest: Effort normal.  Abdominal: She exhibits no distension.  Musculoskeletal: Normal range of motion.  Neurological: She is alert and oriented to person, place, and time.  Skin: Skin is warm.  Psychiatric: She has a normal mood  and affect.    ED Course  Procedures (including critical care time) Labs Review Labs Reviewed - No data to display  Imaging Review No results found.   EKG Interpretation None      MDM   Final diagnoses:  URI (upper respiratory infection)  Asthma exacerbation    Prednisone tussionex     Elson Areas, PA-C 08/11/14 1847

## 2014-08-11 NOTE — ED Provider Notes (Signed)
Medical screening examination/treatment/procedure(s) were performed by non-physician practitioner and as supervising physician I was immediately available for consultation/collaboration.   EKG Interpretation None      Linwood Dibbles, MD 08/11/14 [phone]

## 2014-08-11 NOTE — ED Notes (Signed)
Sick exposure

## 2014-08-11 NOTE — Discharge Instructions (Signed)

## 2015-01-05 ENCOUNTER — Encounter (HOSPITAL_BASED_OUTPATIENT_CLINIC_OR_DEPARTMENT_OTHER): Payer: Self-pay

## 2015-01-05 ENCOUNTER — Emergency Department (HOSPITAL_BASED_OUTPATIENT_CLINIC_OR_DEPARTMENT_OTHER): Payer: Medicare HMO

## 2015-01-05 ENCOUNTER — Emergency Department (HOSPITAL_BASED_OUTPATIENT_CLINIC_OR_DEPARTMENT_OTHER)
Admission: EM | Admit: 2015-01-05 | Discharge: 2015-01-05 | Disposition: A | Discharge status: Home / Self Care | Payer: Medicare HMO | Attending: Emergency Medicine | Admitting: Emergency Medicine

## 2015-01-05 DIAGNOSIS — F329 Major depressive disorder, single episode, unspecified: Secondary | ICD-10-CM | POA: Insufficient documentation

## 2015-01-05 DIAGNOSIS — J45909 Unspecified asthma, uncomplicated: Secondary | ICD-10-CM | POA: Diagnosis not present

## 2015-01-05 DIAGNOSIS — Z79899 Other long term (current) drug therapy: Secondary | ICD-10-CM | POA: Diagnosis not present

## 2015-01-05 DIAGNOSIS — Z720 Tobacco use: Secondary | ICD-10-CM | POA: Diagnosis not present

## 2015-01-05 DIAGNOSIS — M069 Rheumatoid arthritis, unspecified: Secondary | ICD-10-CM | POA: Diagnosis not present

## 2015-01-05 DIAGNOSIS — M25561 Pain in right knee: Secondary | ICD-10-CM | POA: Insufficient documentation

## 2015-01-05 DIAGNOSIS — F419 Anxiety disorder, unspecified: Secondary | ICD-10-CM | POA: Diagnosis not present

## 2015-01-05 HISTORY — DX: Fibromyalgia: M79.7

## 2015-01-05 HISTORY — DX: Rheumatoid arthritis, unspecified: M06.9

## 2015-01-05 NOTE — ED Provider Notes (Signed)
CSN: [id number]     Arrival date & time 01/05/15  1610 History   First MD Initiated Contact with Patient 01/05/15 1644     Chief Complaint  Patient presents with  . Knee Pain     (Consider location/radiation/quality/duration/timing/severity/associated sxs/prior Treatment) HPI Comments: Patient presents with right knee pain. She states it's been hurting for about one week. She noted a little bit of swelling today. States it's worse when she is up walking on it. She denies any other joint problems other than she has fibromyalgia. She denies any fevers. She denies any recent injuries to the leg. She's producing ibuprofen without relief.  Patient is a 49 y.o. female presenting with knee pain.  Knee Pain Associated symptoms: no back pain, no fever and no neck pain     Past Medical History  Diagnosis Date  . Asthma   . Anxiety   . Depression   . Fibromyalgia   . Rheumatoid arthritis    Past Surgical History  Procedure Laterality Date  . Back surgery    . Abdominal hysterectomy    . Ovarian cyst removal    . Appendectomy     History reviewed. No pertinent family history. History  Substance Use Topics  . Smoking status: Current Every Day Smoker -- 1.00 packs/day    Types: Cigarettes  . Smokeless tobacco: Not on file  . Alcohol Use: No   OB History    No data available     Review of Systems  Constitutional: Negative for fever.  Gastrointestinal: Negative for nausea and vomiting.  Musculoskeletal: Positive for joint swelling and arthralgias. Negative for back pain and neck pain.  Skin: Negative for wound.  Neurological: Negative for weakness, numbness and headaches.      Allergies  Aspirin and Lithium  Home Medications   Prior to Admission medications   Medication Sig Start Date End Date Taking? Authorizing Provider  ALPRAZolam Prudy Feeler) 1 MG tablet Take 1 mg by mouth 3 (three) times daily.    Yes Historical Provider, MD  estradiol (ESTRACE) 1 MG tablet Take 2 mg by  mouth daily.   Yes Historical Provider, MD  omeprazole (PRILOSEC) 20 MG capsule Take 20 mg by mouth daily.   Yes Historical Provider, MD  benzonatate (TESSALON) 100 MG capsule Take 1 capsule (100 mg total) by mouth every 8 (eight) hours. 10/21/13   Rolland Porter, MD  chlorpheniramine-HYDROcodone [facility] PENNKINETIC ER) 10-8 MG/5ML LQCR Take 5 mLs by mouth every 12 (twelve) hours. 10/21/13   Rolland Porter, MD  chlorpheniramine-HYDROcodone [facility] PENNKINETIC ER) 10-8 MG/5ML LQCR Take 5 mLs by mouth every 12 (twelve) hours as needed. 08/11/14   Elson Areas, PA-C  loperamide (IMODIUM) 2 MG capsule Take 1 capsule (2 mg total) by mouth 4 (four) times daily as needed for diarrhea or loose stools. 10/16/12   Gavin Pound. Ghim, MD  metoCLOPramide (REGLAN) 10 MG tablet Take 1 tablet (10 mg total) by mouth every 6 (six) hours. 06/14/11 06/24/11  Vida Roller, MD  metoCLOPramide (REGLAN) 10 MG tablet Take 1 tablet (10 mg total) by mouth 4 (four) times daily as needed (for nausea). 10/16/12   Gavin Pound. Ghim, MD  predniSONE (DELTASONE) 10 MG tablet 6.5.4.3.2.1 taper 08/11/14   Elson Areas, PA-C  promethazine (PHENERGAN) 25 MG tablet Take 1 tablet (25 mg total) by mouth every 6 (six) hours as needed for nausea. 07/16/12 07/23/12  Geoffery Lyons, MD   BP 131/86 mmHg  Pulse 65  Temp(Src) 97.6 F (36.4  C) (Oral)  Resp 20  Ht 5\' 3"  (1.6 m)  Wt 242 lb (109.77 kg)  BMI 42.88 kg/m2  SpO2 100% Physical Exam  Constitutional: She is oriented to person, place, and time. She appears well-developed and well-nourished.  HENT:  Head: Normocephalic and atraumatic.  Neck: Normal range of motion. Neck supple.  Cardiovascular: Normal rate.   Pulmonary/Chest: Effort normal.  Musculoskeletal: She exhibits edema and tenderness.  Patient has some very mild swelling over the right knee. There some pain on palpation and range of motion of the right knee. Ligaments appear stable. There is no pain to the hip or the ankle. She has  normal pulses and sensation in the right leg. She has normal motor function in the leg. There is no erythema or warmth over the knee.  Neurological: She is alert and oriented to person, place, and time.  Skin: Skin is warm and dry.  Psychiatric: She has a normal mood and affect.    ED Course  Procedures (including critical care time) Labs Review Labs Reviewed - No data to display  Imaging Review Dg Knee 1-2 Views Right  01/05/2015   CLINICAL DATA:  Acute right knee pain for 1 week. No known injury. Initial encounter.  EXAM: RIGHT KNEE - 1-2 VIEW  COMPARISON:  None.  FINDINGS: There is no evidence of fracture, dislocation, or joint effusion. There is no evidence of arthropathy or other focal bone abnormality. Soft tissues are unremarkable.  IMPRESSION: Normal right knee.   Electronically Signed   By: 03/06/2015 M.D.   On: 01/05/2015 17:08     EKG Interpretation None      MDM   Final diagnoses:  Knee pain, acute, right    Patient with likely musculoskeletal joint pain in the right knee. I don't see any evidence of infection. There is no significant effusion. She was advised in ice and elevation. She was advised to continue ibuprofen at home. An Ace wrap was placed around the knee. She was given referral to follow-up with Dr. 03/06/2015.    Pearletha Forge, MD 01/05/15 [phone]

## 2015-01-05 NOTE — Discharge Instructions (Signed)

## 2015-01-05 NOTE — ED Notes (Signed)
Pt reports right knee pain/edema for one week - denies known injury.

## 2015-03-09 ENCOUNTER — Emergency Department (HOSPITAL_BASED_OUTPATIENT_CLINIC_OR_DEPARTMENT_OTHER)
Admission: EM | Admit: 2015-03-09 | Discharge: 2015-03-09 | Disposition: A | Discharge status: Home / Self Care | Payer: Medicare HMO | Attending: Emergency Medicine | Admitting: Emergency Medicine

## 2015-03-09 ENCOUNTER — Encounter (HOSPITAL_BASED_OUTPATIENT_CLINIC_OR_DEPARTMENT_OTHER): Payer: Self-pay | Admitting: *Deleted

## 2015-03-09 ENCOUNTER — Emergency Department (HOSPITAL_BASED_OUTPATIENT_CLINIC_OR_DEPARTMENT_OTHER): Payer: Medicare HMO

## 2015-03-09 DIAGNOSIS — J45909 Unspecified asthma, uncomplicated: Secondary | ICD-10-CM | POA: Insufficient documentation

## 2015-03-09 DIAGNOSIS — R0981 Nasal congestion: Secondary | ICD-10-CM

## 2015-03-09 DIAGNOSIS — J069 Acute upper respiratory infection, unspecified: Secondary | ICD-10-CM | POA: Diagnosis not present

## 2015-03-09 DIAGNOSIS — F329 Major depressive disorder, single episode, unspecified: Secondary | ICD-10-CM | POA: Insufficient documentation

## 2015-03-09 DIAGNOSIS — Z8739 Personal history of other diseases of the musculoskeletal system and connective tissue: Secondary | ICD-10-CM | POA: Diagnosis not present

## 2015-03-09 DIAGNOSIS — R05 Cough: Secondary | ICD-10-CM | POA: Diagnosis present

## 2015-03-09 DIAGNOSIS — F419 Anxiety disorder, unspecified: Secondary | ICD-10-CM | POA: Diagnosis not present

## 2015-03-09 DIAGNOSIS — Z79899 Other long term (current) drug therapy: Secondary | ICD-10-CM | POA: Diagnosis not present

## 2015-03-09 DIAGNOSIS — Z720 Tobacco use: Secondary | ICD-10-CM | POA: Diagnosis not present

## 2015-03-09 MED ORDER — FLUTICASONE PROPIONATE 50 MCG/ACT NA SUSP: 2.0000 | Freq: Every day | NASAL | Status: DC

## 2015-03-09 MED ORDER — BENZONATATE 100 MG PO CAPS: 100.0000 mg | ORAL_CAPSULE | Freq: Three times a day (TID) | ORAL | Status: DC

## 2015-03-09 NOTE — Discharge Instructions (Signed)
Take Tessalon as directed for cough. Use nasal spray as prescribed. Rest and stay well-hydrated.  Upper Respiratory Infection, Adult An upper respiratory infection (URI) is also sometimes known as the common cold. The upper respiratory tract includes the nose, sinuses, throat, trachea, and bronchi. Bronchi are the airways leading to the lungs. Most people improve within 1 week, but symptoms can last up to 2 weeks. A residual cough may last even longer.  CAUSES Many different viruses can infect the tissues lining the upper respiratory tract. The tissues become irritated and inflamed and often become very moist. Mucus production is also common. A cold is contagious. You can easily spread the virus to others by oral contact. This includes kissing, sharing a glass, coughing, or sneezing. Touching your mouth or nose and then touching a surface, which is then touched by another person, can also spread the virus. SYMPTOMS  Symptoms typically develop 1 to 3 days after you come in contact with a cold virus. Symptoms vary from person to person. They may include:  Runny nose.  Sneezing.  Nasal congestion.  Sinus irritation.  Sore throat.  Loss of voice (laryngitis).  Cough.  Fatigue.  Muscle aches.  Loss of appetite.  Headache.  Low-grade fever. DIAGNOSIS  You might diagnose your own cold based on familiar symptoms, since most people get a cold 2 to 3 times a year. Your caregiver can confirm this based on your exam. Most importantly, your caregiver can check that your symptoms are not due to another disease such as strep throat, sinusitis, pneumonia, asthma, or epiglottitis. Blood tests, throat tests, and X-rays are not necessary to diagnose a common cold, but they may sometimes be helpful in excluding other more serious diseases. Your caregiver will decide if any further tests are required. RISKS AND COMPLICATIONS  You may be at risk for a more severe case of the common cold if you smoke  cigarettes, have chronic heart disease (such as heart failure) or lung disease (such as asthma), or if you have a weakened immune system. The very young and very old are also at risk for more serious infections. Bacterial sinusitis, middle ear infections, and bacterial pneumonia can complicate the common cold. The common cold can worsen asthma and chronic obstructive pulmonary disease (COPD). Sometimes, these complications can require emergency medical care and may be life-threatening. PREVENTION  The best way to protect against getting a cold is to practice good hygiene. Avoid oral or hand contact with people with cold symptoms. Wash your hands often if contact occurs. There is no clear evidence that vitamin C, vitamin E, echinacea, or exercise reduces the chance of developing a cold. However, it is always recommended to get plenty of rest and practice good nutrition. TREATMENT  Treatment is directed at relieving symptoms. There is no cure. Antibiotics are not effective, because the infection is caused by a virus, not by bacteria. Treatment may include:  Increased fluid intake. Sports drinks offer valuable electrolytes, sugars, and fluids.  Breathing heated mist or steam (vaporizer or shower).  Eating chicken soup or other clear broths, and maintaining good nutrition.  Getting plenty of rest.  Using gargles or lozenges for comfort.  Controlling fevers with ibuprofen or acetaminophen as directed by your caregiver.  Increasing usage of your inhaler if you have asthma. Zinc gel and zinc lozenges, taken in the first 24 hours of the common cold, can shorten the duration and lessen the severity of symptoms. Pain medicines may help with fever, muscle aches, and throat  pain. A variety of non-prescription medicines are available to treat congestion and runny nose. Your caregiver can make recommendations and may suggest nasal or lung inhalers for other symptoms.  HOME CARE INSTRUCTIONS   Only take  over-the-counter or prescription medicines for pain, discomfort, or fever as directed by your caregiver.  Use a warm mist humidifier or inhale steam from a shower to increase air moisture. This may keep secretions moist and make it easier to breathe.  Drink enough water and fluids to keep your urine clear or pale yellow.  Rest as needed.  Return to work when your temperature has returned to normal or as your caregiver advises. You may need to stay home longer to avoid infecting others. You can also use a face mask and careful hand washing to prevent spread of the virus. SEEK MEDICAL CARE IF:   After the first few days, you feel you are getting worse rather than better.  You need your caregiver's advice about medicines to control symptoms.  You develop chills, worsening shortness of breath, or brown or red sputum. These may be signs of pneumonia.  You develop yellow or brown nasal discharge or pain in the face, especially when you bend forward. These may be signs of sinusitis.  You develop a fever, swollen neck glands, pain with swallowing, or white areas in the back of your throat. These may be signs of strep throat. SEEK IMMEDIATE MEDICAL CARE IF:   You have a fever.  You develop severe or persistent headache, ear pain, sinus pain, or chest pain.  You develop wheezing, a prolonged cough, cough up blood, or have a change in your usual mucus (if you have chronic lung disease).  You develop sore muscles or a stiff neck. Document Released: 05/13/2001 Document Revised: 02/09/2012 Document Reviewed: 02/22/2014 [facility] Patient Information 2015 [place], [place]. This information is not intended to replace advice given to you by your health care provider. Make sure you discuss any questions you have with your health care provider.

## 2015-03-09 NOTE — ED Notes (Signed)
Pt c/o cough that is intermittently productive of yellow mucous. She is also c/o sore throat and chills. Diarrhea also but no N/V.

## 2015-03-09 NOTE — ED Provider Notes (Signed)
CSN: [id number]     Arrival date & time 03/09/15  1214 History   First MD Initiated Contact with Patient 03/09/15 1259     Chief Complaint  Patient presents with  . Cough     (Consider location/radiation/quality/duration/timing/severity/associated sxs/prior Treatment) HPI Comments: 49 year old female complaining of productive cough with yellow phlegm, sore throat, chills, diarrhea and generalized body aches 3 days. Denies fevers, nausea or vomiting. No sick contacts. She has tried taking Tussin DM with no relief. States "every time I have this, I come here and they give me cough medicine with codeine and it goes away". Had her flu shot this year.  Patient is a 49 y.o. female presenting with cough. The history is provided by the patient.  Cough Associated symptoms: sore throat     Past Medical History  Diagnosis Date  . Asthma   . Anxiety   . Depression   . Fibromyalgia   . Rheumatoid arthritis    Past Surgical History  Procedure Laterality Date  . Back surgery    . Abdominal hysterectomy    . Ovarian cyst removal    . Appendectomy     No family history on file. History  Substance Use Topics  . Smoking status: Current Every Day Smoker -- 1.00 packs/day    Types: Cigarettes  . Smokeless tobacco: Not on file  . Alcohol Use: No   OB History    No data available     Review of Systems  Constitutional: Positive for fatigue.  HENT: Positive for congestion and sore throat.   Respiratory: Positive for cough.   Gastrointestinal: Positive for diarrhea.  All other systems reviewed and are negative.     Allergies  Aspirin and Lithium  Home Medications   Prior to Admission medications   Medication Sig Start Date End Date Taking? Authorizing Provider  ALPRAZolam Prudy Feeler) 1 MG tablet Take 1 mg by mouth 3 (three) times daily.     Historical Provider, MD  benzonatate (TESSALON) 100 MG capsule Take 1 capsule (100 mg total) by mouth every 8 (eight) hours. 10/21/13   Rolland Porter, MD  benzonatate (TESSALON) 100 MG capsule Take 1 capsule (100 mg total) by mouth every 8 (eight) hours. 03/09/15   Kathrynn Speed, PA-C  chlorpheniramine-HYDROcodone (TUSSIONEX PENNKINETIC ER) 10-8 MG/5ML LQCR Take 5 mLs by mouth every 12 (twelve) hours. 10/21/13   Rolland Porter, MD  chlorpheniramine-HYDROcodone [facility] PENNKINETIC ER) 10-8 MG/5ML LQCR Take 5 mLs by mouth every 12 (twelve) hours as needed. 08/11/14   Elson Areas, PA-C  estradiol (ESTRACE) 1 MG tablet Take 2 mg by mouth daily.    Historical Provider, MD  fluticasone (FLONASE) 50 MCG/ACT nasal spray Place 2 sprays into both nostrils daily. 03/09/15   Kathrynn Speed, PA-C  loperamide (IMODIUM) 2 MG capsule Take 1 capsule (2 mg total) by mouth 4 (four) times daily as needed for diarrhea or loose stools. 10/16/12   Quita Skye, MD  metoCLOPramide (REGLAN) 10 MG tablet Take 1 tablet (10 mg total) by mouth every 6 (six) hours. 06/14/11 06/24/11  Eber Hong, MD  metoCLOPramide (REGLAN) 10 MG tablet Take 1 tablet (10 mg total) by mouth 4 (four) times daily as needed (for nausea). 10/16/12   Quita Skye, MD  omeprazole (PRILOSEC) 20 MG capsule Take 20 mg by mouth daily.    Historical Provider, MD  predniSONE (DELTASONE) 10 MG tablet 6.5.4.3.2.1 taper 08/11/14   Elson Areas, PA-C  promethazine (PHENERGAN) 25 MG tablet Take 1  tablet (25 mg total) by mouth every 6 (six) hours as needed for nausea. 07/16/12 07/23/12  Geoffery Lyons, MD   BP 100/73 mmHg  Pulse 98  Temp(Src) 98 F (36.7 C) (Oral)  Resp 20  Ht 5\' 3"  (1.6 m)  Wt 242 lb (109.77 kg)  BMI 42.88 kg/m2  SpO2 98% Physical Exam  Constitutional: She is oriented to person, place, and time. She appears well-developed and well-nourished. No distress.  HENT:  Head: Normocephalic and atraumatic.  Mouth/Throat: No oropharyngeal exudate.  Nasal congestion, mucosal edema, postnasal drip.  Eyes: Conjunctivae and EOM are normal.  Neck: Normal range of motion. Neck supple.   Cardiovascular: Normal rate, regular rhythm and normal heart sounds.   Pulmonary/Chest: Effort normal and breath sounds normal. No respiratory distress. She has no wheezes. She has no rales.  Musculoskeletal: Normal range of motion. She exhibits no edema.  Lymphadenopathy:    She has no cervical adenopathy.  Neurological: She is alert and oriented to person, place, and time. No sensory deficit.  Skin: Skin is warm and dry.  Psychiatric: She has a normal mood and affect. Her behavior is normal.  Nursing note and vitals reviewed.   ED Course  Procedures (including critical care time) Labs Review Labs Reviewed - No data to display  Imaging Review Dg Chest 2 View  03/09/2015   CLINICAL DATA:  Cough with chest pain, shortness of breath and diarrhea 3 days. History of asthma and COPD.  EXAM: CHEST  2 VIEW  COMPARISON:  10/21/2013  FINDINGS: Lungs are adequately inflated without consolidation or effusion. The cardiomediastinal silhouette, bones and soft tissues are within normal.  IMPRESSION: No active cardiopulmonary disease.   Electronically Signed   By: 10/23/2013 M.D.   On: 03/09/2015 13:08     EKG Interpretation None      MDM   Final diagnoses:  URI (upper respiratory infection)  Nasal congestion   Nontoxic appearing, NAD. AF VSS. Chest x-ray ordered by triage prior to patient being seen, no acute findings. Lungs clear. Discussed symptomatic treatment. Follow-up with PCP. Rx Tessalon and Flonase. Stable for discharge. Return precautions given. Patient states understanding of treatment care plan and is agreeable.   05/09/2015, PA-C 03/09/15 1332  05/09/15, MD 03/12/15 [phone]

## 2017-05-26 ENCOUNTER — Emergency Department (HOSPITAL_BASED_OUTPATIENT_CLINIC_OR_DEPARTMENT_OTHER): Payer: Medicare HMO

## 2017-05-26 ENCOUNTER — Encounter (HOSPITAL_BASED_OUTPATIENT_CLINIC_OR_DEPARTMENT_OTHER): Payer: Self-pay | Admitting: *Deleted

## 2017-05-26 ENCOUNTER — Emergency Department (HOSPITAL_BASED_OUTPATIENT_CLINIC_OR_DEPARTMENT_OTHER)
Admission: EM | Admit: 2017-05-26 | Discharge: 2017-05-27 | Disposition: A | Discharge status: Home / Self Care | Payer: Medicare HMO | Attending: Emergency Medicine | Admitting: Emergency Medicine

## 2017-05-26 DIAGNOSIS — J45909 Unspecified asthma, uncomplicated: Secondary | ICD-10-CM | POA: Diagnosis not present

## 2017-05-26 DIAGNOSIS — Z79899 Other long term (current) drug therapy: Secondary | ICD-10-CM | POA: Diagnosis not present

## 2017-05-26 DIAGNOSIS — M25561 Pain in right knee: Secondary | ICD-10-CM

## 2017-05-26 DIAGNOSIS — F17210 Nicotine dependence, cigarettes, uncomplicated: Secondary | ICD-10-CM | POA: Diagnosis not present

## 2017-05-26 DIAGNOSIS — M25562 Pain in left knee: Secondary | ICD-10-CM | POA: Diagnosis present

## 2017-05-26 MED ORDER — ACETAMINOPHEN 500 MG PO TABS: 1000.0000 mg | ORAL_TABLET | Freq: Once | ORAL | Status: DC

## 2017-05-26 NOTE — ED Triage Notes (Signed)
Pt c/o left knee pain  X 5 days

## 2017-05-26 NOTE — ED Notes (Signed)
Pt still waiting for provider 

## 2017-05-26 NOTE — ED Provider Notes (Signed)
[facility] Provider Note   CSN: [id number] Arrival date & time: 05/26/17  2058  By signing my name below, I, Linna Darner, attest that this documentation has been prepared under the direction and in the presence of Sharen Heck, PA-C. Electronically Signed: Linna Darner, Scribe. 05/26/2017. 11:02 PM.  History   Chief Complaint Chief Complaint  Patient presents with  . Knee Pain   The history is provided by the patient. No language interpreter was used.   HPI Comments: Brittney Sanford is a 51 y.o. female with PMHx including fibromyalgia and rheumatoid arthritis who presents to the Emergency Department complaining of constant posterior left knee pain radiating distally for six days. Patient reports some associated swelling. She states the pain is most prominent posteriorly and laterally and she also notes some significant pain in her left calf. Patient states she started exercising regularly six days ago and believes her knee pain may be related. No recent falls or trauma to her left knee. No prior h/o left knee problems. No alleviating factors noted. Her pain is worse with weight bearing and applied pressure. She is able to ambulate with difficulty secondary to pain. She has a h/o blood clot in an upper extremity and used a blood thinner with resolution. No h/o gout. No h/o immunocompromised conditions. No recent hormone therapy use. No recent immobilizations. Patient denies numbness/tingling, bruising, or any other associated symptoms.  Past Medical History:  Diagnosis Date  . Anxiety   . Asthma   . Depression   . Fibromyalgia   . Rheumatoid arthritis (HCC)     There are no active problems to display for this patient.   Past Surgical History:  Procedure Laterality Date  . ABDOMINAL HYSTERECTOMY    . APPENDECTOMY    . BACK SURGERY    . OVARIAN CYST REMOVAL      OB History    No data available       Home Medications    Prior to Admission medications     Medication Sig Start Date End Date Taking? Authorizing Provider  oxyCODONE-acetaminophen (PERCOCET) 10-325 MG tablet Take 1 tablet by mouth every 4 (four) hours as needed for pain.   Yes [provider]  ALPRAZolam Prudy Feeler) 1 MG tablet Take 1 mg by mouth 3 (three) times daily.     [provider]  chlorpheniramine-HYDROcodone (TUSSIONEX PENNKINETIC ER) 10-8 MG/5ML LQCR Take 5 mLs by mouth every 12 (twelve) hours. 10/21/13   Rolland Porter, MD  chlorpheniramine-HYDROcodone [facility] ER) 10-8 MG/5ML LQCR Take 5 mLs by mouth every 12 (twelve) hours as needed. 08/11/14   Elson Areas, PA-C  estradiol (ESTRACE) 1 MG tablet Take 2 mg by mouth daily.    [provider]  fluticasone (FLONASE) 50 MCG/ACT nasal spray Place 2 sprays into both nostrils daily. 03/09/15   Hess, Nada Boozer, PA-C  loperamide (IMODIUM) 2 MG capsule Take 1 capsule (2 mg total) by mouth 4 (four) times daily as needed for diarrhea or loose stools. 10/16/12   Quita Skye, MD  metoCLOPramide (REGLAN) 10 MG tablet Take 1 tablet (10 mg total) by mouth every 6 (six) hours. 06/14/11 06/24/11  Eber Hong, MD  metoCLOPramide (REGLAN) 10 MG tablet Take 1 tablet (10 mg total) by mouth 4 (four) times daily as needed (for nausea). 10/16/12   Quita Skye, MD  omeprazole (PRILOSEC) 20 MG capsule Take 20 mg by mouth daily.    [provider]  predniSONE (DELTASONE) 10 MG tablet 6.5.4.3.2.1 taper 08/11/14  Elson Areas, PA-C  promethazine (PHENERGAN) 25 MG tablet Take 1 tablet (25 mg total) by mouth every 6 (six) hours as needed for nausea. 07/16/12 07/23/12  Geoffery Lyons, MD    Family History History reviewed. No pertinent family history.  Social History Social History  Substance Use Topics  . Smoking status: Current Every Day Smoker    Packs/day: 1.00    Types: Cigarettes  . Smokeless tobacco: Never Used  . Alcohol use No     Allergies   Aspirin and Lithium   Review of  Systems Review of Systems  Musculoskeletal: Positive for arthralgias, gait problem, joint swelling and myalgias.  Skin: Negative for color change and wound.  Neurological: Negative for numbness.   Physical Exam Updated Vital Signs BP 122/83 (BP Location: Right Arm)   Pulse 86   Temp 98.3 F (36.8 C)   Resp 18   Ht 5\' 3"  (1.6 m)   Wt 124.3 kg (274 lb)   SpO2 96%   BMI 48.54 kg/m   Physical Exam  Constitutional: She is oriented to person, place, and time. She appears well-developed and well-nourished. No distress.  NAD.  HENT:  Head: Normocephalic and atraumatic.  Right Ear: External ear normal.  Left Ear: External ear normal.  Nose: Nose normal.  Eyes: Conjunctivae and EOM are normal. No scleral icterus.  Neck: Normal range of motion. Neck supple.  Cardiovascular: Normal rate, regular rhythm and normal heart sounds.   No murmur heard. DP and popliteal pulses are intact.  No asymmetric lower extremity edema. Left upper calf and popliteal space tender  Pulmonary/Chest: Effort normal and breath sounds normal. She has no wheezes.  Musculoskeletal: Normal range of motion. She exhibits tenderness.  Point tenderness to the patella, quadriceps tendon, lateral joint line, and popliteal space on the left. There is also tenderness to the left upper calf.   Neurological: She is alert and oriented to person, place, and time.  5/5 strength with knee flexion and extension, bilaterally.  5/5 strength with ankle dorsiflexion and plantar flexion, bilaterally.  Feet: sensation to light touch intact in the distribution of the saphenous nerve and sural nerve, bilaterally.   Skin: Skin is warm and dry. Capillary refill takes less than 2 seconds.  Psychiatric: She has a normal mood and affect. Her behavior is normal. Judgment and thought content normal.  Nursing note and vitals reviewed.  ED Treatments / Results  Labs (all labs ordered are listed, but only abnormal results are displayed) Labs  Reviewed - No data to display  EKG  EKG Interpretation None       Radiology Venous Img Lower Unilateral Left  Result Date: 05/27/2017 CLINICAL DATA:  Left knee pain for 6 days EXAM: Left LOWER EXTREMITY VENOUS DOPPLER ULTRASOUND TECHNIQUE: Gray-scale sonography with graded compression, as well as color Doppler and duplex ultrasound were performed to evaluate the lower extremity deep venous systems from the level of the common femoral vein and including the common femoral, femoral, profunda femoral, popliteal and calf veins including the posterior tibial, peroneal and gastrocnemius veins when visible. The superficial great saphenous vein was also interrogated. Spectral Doppler was utilized to evaluate flow at rest and with distal augmentation maneuvers in the common femoral, femoral and popliteal veins. COMPARISON:  None. FINDINGS: Contralateral Common Femoral Vein: Respiratory phasicity is normal and symmetric with the symptomatic side. No evidence of thrombus. Normal compressibility. Common Femoral Vein: No evidence of thrombus. Normal compressibility, respiratory phasicity and response to augmentation. Saphenofemoral Junction: No  evidence of thrombus. Normal compressibility and flow on color Doppler imaging. Profunda Femoral Vein: No evidence of thrombus. Normal compressibility and flow on color Doppler imaging. Femoral Vein: No evidence of thrombus. Normal compressibility, respiratory phasicity and response to augmentation. Popliteal Vein: No evidence of thrombus. Normal compressibility, respiratory phasicity and response to augmentation. Calf Veins: No evidence of thrombus. Normal compressibility and flow on color Doppler imaging. Superficial Great Saphenous Vein: No evidence of thrombus. Normal compressibility and flow on color Doppler imaging. Venous Reflux:  None. Other Findings:  Small joint effusion. IMPRESSION: No evidence of DVT within the left lower extremity. There is a small joint  effusion. Electronically Signed   By: Ellery Plunk M.D.   On: 05/27/2017 01:21   Dg Knee Complete 4 Views Left  Result Date: 05/26/2017 CLINICAL DATA:  Worsening left knee pain for 2 days. Recent unaccustomed exercise. EXAM: LEFT KNEE - COMPLETE 4+ VIEW COMPARISON:  None. FINDINGS: No evidence of fracture, dislocation, or joint effusion. No evidence of arthropathy or other focal bone abnormality. Soft tissues are unremarkable. IMPRESSION: Negative. Electronically Signed   By: Ellery Plunk M.D.   On: 05/26/2017 22:36    Procedures Procedures (including critical care time)  DIAGNOSTIC STUDIES: Oxygen Saturation is 100% on RA, normal by my interpretation.    COORDINATION OF CARE: 11:00 PM Discussed treatment plan with pt at bedside and pt agreed to plan.  Medications Ordered in ED Medications  acetaminophen (TYLENOL) tablet 1,000 mg (0 mg Oral Hold 05/26/17 2309)     Initial Impression / Assessment and Plan / ED Course  I have reviewed the triage vital signs and the nursing notes.  Pertinent labs & imaging results that were available during my care of the patient were reviewed by me and considered in my medical decision making (see chart for details).    51 year old female presents with sudden onset left knee pain, worse posteriorly and laterally 6 days associated with mild edema. Sharp pain radiates to posterior calf. Reports history of upper extremity blood clot that was treated with anticoagulation many years ago. On exam extremities neurovascularly intact. Popliteal space and upper left calf tender. Given history, localization of popliteal pain x-ray and lower extremity ultrasound ordered to rule out bony etiology versus DVT. Patient recently started working out prior to symptom onset, high suspicion for overuse/soft tissue injury. Doubt septic arthritis or gout.  X-ray and lower extremity ultrasound negative. Discussed results with patient. We'll discharge him with high-dose  NSAIDs, RICE, knee sleeve. Advised patient to monitor symptoms and follow-up with PCP for worsening or persistent symptoms. Patient verbalized understanding and is agreeable with plan.  Final Clinical Impressions(s) / ED Diagnoses   Final diagnoses:  Acute pain of right knee    New Prescriptions Discharge Medication List as of 05/27/2017  1:28 AM     I personally performed the services described in this documentation, which was scribed in my presence. The recorded information has been reviewed and is accurate.    Liberty Handy, PA-C 05/27/17 0148    Liberty Handy, PA-C 05/27/17 0149    Tilden Fossa, MD 05/27/17 [phone]

## 2017-05-27 DIAGNOSIS — M25562 Pain in left knee: Secondary | ICD-10-CM | POA: Diagnosis not present

## 2017-05-27 NOTE — Discharge Instructions (Addendum)
Your knee x-ray was overall reassuring without evidence of fracture, dislocation or arthritis.  Given you location of pain, we obtained an ultrasound to rule out a blood clot or cyst in your knee.  Your ultrasound did not show a blood clot or Baker's cyst, it did show a very small joint effusion (fluid), this can happen with local inflammation.  I suspect you have a soft tissue injury and/or inflammation from recent increase in exercise.   Take ibuprofen and tylenol for pain, rest, ice and elevate.  Wear your knee sleeve for 7 days, after 7 days you may discontinue sleeve if pain free.  Monitor your symptoms, if they worsen or persist after 7 days follow up with your primary care provider.

## 2017-06-07 ENCOUNTER — Encounter (HOSPITAL_BASED_OUTPATIENT_CLINIC_OR_DEPARTMENT_OTHER): Payer: Self-pay | Admitting: Emergency Medicine

## 2017-06-07 ENCOUNTER — Emergency Department (HOSPITAL_BASED_OUTPATIENT_CLINIC_OR_DEPARTMENT_OTHER)
Admission: EM | Admit: 2017-06-07 | Discharge: 2017-06-07 | Disposition: A | Discharge status: Home / Self Care | Payer: Medicare HMO | Attending: Emergency Medicine | Admitting: Emergency Medicine

## 2017-06-07 DIAGNOSIS — F17210 Nicotine dependence, cigarettes, uncomplicated: Secondary | ICD-10-CM | POA: Insufficient documentation

## 2017-06-07 DIAGNOSIS — Z79899 Other long term (current) drug therapy: Secondary | ICD-10-CM | POA: Insufficient documentation

## 2017-06-07 DIAGNOSIS — N760 Acute vaginitis: Secondary | ICD-10-CM | POA: Insufficient documentation

## 2017-06-07 DIAGNOSIS — J45909 Unspecified asthma, uncomplicated: Secondary | ICD-10-CM | POA: Diagnosis not present

## 2017-06-07 DIAGNOSIS — N898 Other specified noninflammatory disorders of vagina: Secondary | ICD-10-CM | POA: Diagnosis present

## 2017-06-07 LAB — WET PREP, GENITAL
Sperm: NONE SEEN
Trich, Wet Prep: NONE SEEN
YEAST WET PREP: NONE SEEN

## 2017-06-07 MED ORDER — FLUCONAZOLE 100 MG PO TABS
150.0000 mg | ORAL_TABLET | Freq: Once | ORAL | Status: AC
  Administered 2017-06-07: 150 mg via ORAL
  Filled 2017-06-07: qty 1

## 2017-06-07 MED ORDER — FLUCONAZOLE 150 MG PO TABS: 150.0000 mg | ORAL_TABLET | Freq: Every day | ORAL | 0 refills | Status: AC

## 2017-06-07 NOTE — ED Notes (Signed)
EDP into room, prior to RN assessment, see MD notes, orders received and intiated.   Alert, NAD, calm, interactive, resps e/u, speaking in clear complete sentences, no dyspnea noted, skin W&D, VSS, (denies: pain, sob, nausea, dizziness or visual changes).

## 2017-06-07 NOTE — ED Triage Notes (Signed)
Patient states that she recently tested positive for clhymidia. Reports that since she took the meds she has had vaginal itching and odor

## 2017-06-07 NOTE — ED Provider Notes (Signed)
[facility]   CSN: [id number] Arrival date & time: 06/07/17  1942  By signing my name below, I, Linna Darner, attest that this documentation has been prepared under the direction and in the presence of non-physician practitioner, Jaynie Crumble, PA-C. Electronically Signed: Linna Darner, Scribe. 06/07/2017. 8:31 PM.  History   Chief Complaint Chief Complaint  Patient presents with  . Vaginal Itching   The history is provided by the patient. No language interpreter was used.    HPI Comments: Brittney Sanford is a 51 y.o. female who presents to the Emergency Department complaining of persistent vaginal itching and irritation for about one week. She also reports a foul odor when she wipes after urinating over this time span but denies any vaginal discharge. She states that she tested positive for chlamydia about one week ago and was prescribed antibiotics. Patient states she has developed a yeast infection since taking the antibiotics and notes this is a typical reaction to abx. She has used metronidazole gel and Monistat 7 without improvement of her itching. Patient had unprotected intercourse prior to the onset of her symptoms but has not had any intercourse since her itching presented. No h/o DM. Patient denies dysuria, urinary frequency, fevers, chills, genital sores, or any other associated symptoms.  Past Medical History:  Diagnosis Date  . Anxiety   . Asthma   . Depression   . Fibromyalgia   . Rheumatoid arthritis (HCC)     There are no active problems to display for this patient.   Past Surgical History:  Procedure Laterality Date  . ABDOMINAL HYSTERECTOMY    . APPENDECTOMY    . BACK SURGERY    . OVARIAN CYST REMOVAL      OB History    No data available       Home Medications    Prior to Admission medications   Medication Sig Start Date End Date Taking? Authorizing Provider  ALPRAZolam Prudy Feeler) 1 MG tablet Take 1 mg by mouth 3  (three) times daily.     [provider]  chlorpheniramine-HYDROcodone (TUSSIONEX PENNKINETIC ER) 10-8 MG/5ML LQCR Take 5 mLs by mouth every 12 (twelve) hours. 10/21/13   Rolland Porter, MD  chlorpheniramine-HYDROcodone [facility] ER) 10-8 MG/5ML LQCR Take 5 mLs by mouth every 12 (twelve) hours as needed. 08/11/14   Elson Areas, PA-C  estradiol (ESTRACE) 1 MG tablet Take 2 mg by mouth daily.    [provider]  fluticasone (FLONASE) 50 MCG/ACT nasal spray Place 2 sprays into both nostrils daily. 03/09/15   Hess, Nada Boozer, PA-C  loperamide (IMODIUM) 2 MG capsule Take 1 capsule (2 mg total) by mouth 4 (four) times daily as needed for diarrhea or loose stools. 10/16/12   Quita Skye, MD  metoCLOPramide (REGLAN) 10 MG tablet Take 1 tablet (10 mg total) by mouth every 6 (six) hours. 06/14/11 06/24/11  Eber Hong, MD  metoCLOPramide (REGLAN) 10 MG tablet Take 1 tablet (10 mg total) by mouth 4 (four) times daily as needed (for nausea). 10/16/12   Quita Skye, MD  omeprazole (PRILOSEC) 20 MG capsule Take 20 mg by mouth daily.    [provider]  oxyCODONE-acetaminophen (PERCOCET) 10-325 MG tablet Take 1 tablet by mouth every 4 (four) hours as needed for pain.    [provider]  predniSONE (DELTASONE) 10 MG tablet 6.5.4.3.2.1 taper 08/11/14   Elson Areas, PA-C  promethazine (PHENERGAN) 25 MG tablet Take 1 tablet (25 mg total) by mouth every 6 (  six) hours as needed for nausea. 07/16/12 07/23/12  Geoffery Lyons, MD    Family History History reviewed. No pertinent family history.  Social History Social History  Substance Use Topics  . Smoking status: Current Every Day Smoker    Packs/day: 1.00    Types: Cigarettes  . Smokeless tobacco: Never Used  . Alcohol use No     Allergies   Aspirin and Lithium   Review of Systems Review of Systems  Constitutional: Negative for chills and fever.  Gastrointestinal: Negative for abdominal pain.    Genitourinary: Positive for vaginal discharge. Negative for dysuria, frequency and genital sores.       Positive for vaginal itching.  All other systems reviewed and are negative.  Physical Exam Updated Vital Signs BP (!) 150/104 (BP Location: Right Arm)   Pulse 89   Temp 98.4 F (36.9 C) (Oral)   Resp 18   Ht 5\' 3"  (1.6 m)   Wt 274 lb (124.3 kg)   SpO2 100%   BMI 48.54 kg/m   Physical Exam  Constitutional: She is oriented to person, place, and time. She appears well-developed and well-nourished. No distress.  HENT:  Head: Normocephalic and atraumatic.  Eyes: Conjunctivae and EOM are normal.  Neck: Neck supple. No tracheal deviation present.  Cardiovascular: Normal rate, regular rhythm and normal heart sounds.   Pulmonary/Chest: Effort normal. No respiratory distress. She has no wheezes. She has no rales.  Genitourinary:  Genitourinary Comments: Normal external genitalia. Thick curdy white discharge. No cervix present. Vaginal walls erythematous, friable.   Musculoskeletal: Normal range of motion.  Neurological: She is alert and oriented to person, place, and time.  Skin: Skin is warm and dry.  Psychiatric: She has a normal mood and affect. Her behavior is normal.  Nursing note and vitals reviewed.  ED Treatments / Results  Labs (all labs ordered are listed, but only abnormal results are displayed) Labs Reviewed  WET PREP, GENITAL - Abnormal; Notable for the following:       Result Value   Clue Cells Wet Prep HPF POC PRESENT (*)    WBC, Wet Prep HPF POC MODERATE (*)    All other components within normal limits  GC/CHLAMYDIA PROBE AMP ([place]) NOT AT [facility]    EKG  EKG Interpretation None       Radiology No results found.  Procedures Procedures (including critical care time)  DIAGNOSTIC STUDIES: Oxygen Saturation is 100% on RA, normal by my interpretation.    COORDINATION OF CARE: 8:23 PM Discussed treatment plan with pt at bedside and pt agreed to  plan.  Medications Ordered in ED Medications - No data to display   Initial Impression / Assessment and Plan / ED Course  I have reviewed the triage vital signs and the nursing notes.  Pertinent labs & imaging results that were available during my care of the patient were reviewed by me and considered in my medical decision making (see chart for details).    Patient with vaginal itching, irritation. No pain. History of hysterectomy. Exam most consistent with Candida vaginitis. Her wet prep did not show any yeast, however patient did use Monistat cream. We will treat with Diflucan. Gonorrhea Chlamydia culture sent. No concern for PID or cervicitis given prior hysterectomy. No urinary symptoms.  Follow-up as needed.   Vitals:   06/07/17 1948  BP: (!) 150/104  Pulse: 89  Resp: 18  Temp: 98.4 F (36.9 C)  TempSrc: Oral  SpO2: 100%  Weight: 124.3 kg (  274 lb)  Height: 5\' 3"  (1.6 m)     Final Clinical Impressions(s) / ED Diagnoses   Final diagnoses:  Acute vaginitis    New Prescriptions New Prescriptions   FLUCONAZOLE (DIFLUCAN) 150 MG TABLET    Take 1 tablet (150 mg total) by mouth daily.   I personally performed the services described in this documentation, which was scribed in my presence. The recorded information has been reviewed and is accurate.    Jaynie Crumble, PA-C 06/07/17 2128    Jacalyn Lefevre, MD 06/07/17 2208

## 2017-06-07 NOTE — Discharge Instructions (Signed)
You were treated today with diflucan for possible yeast infection. If symptoms do not improve, you may take another tablet. Your cultures are pending, and if come back abnormal we will contact you. Follow up with your doctor as needed. Return if worsening.

## 2017-06-08 LAB — GC/CHLAMYDIA PROBE AMP (~~LOC~~) NOT AT ARMC
CHLAMYDIA, DNA PROBE: NEGATIVE
Neisseria Gonorrhea: NEGATIVE

## 2017-10-23 ENCOUNTER — Emergency Department (HOSPITAL_COMMUNITY)
Admission: EM | Admit: 2017-10-23 | Discharge: 2017-10-23 | Disposition: A | Discharge status: Home / Self Care | Payer: Medicare HMO | Attending: Emergency Medicine | Admitting: Emergency Medicine

## 2017-10-23 ENCOUNTER — Other Ambulatory Visit: Payer: Self-pay

## 2017-10-23 ENCOUNTER — Encounter (HOSPITAL_COMMUNITY): Payer: Self-pay | Admitting: Emergency Medicine

## 2017-10-23 DIAGNOSIS — Z5321 Procedure and treatment not carried out due to patient leaving prior to being seen by health care provider: Secondary | ICD-10-CM | POA: Diagnosis not present

## 2017-10-23 DIAGNOSIS — R300 Dysuria: Secondary | ICD-10-CM | POA: Insufficient documentation

## 2017-10-23 LAB — URINALYSIS, ROUTINE W REFLEX MICROSCOPIC
Bilirubin Urine: NEGATIVE
Glucose, UA: NEGATIVE mg/dL
Ketones, ur: NEGATIVE mg/dL
Nitrite: NEGATIVE
Protein, ur: NEGATIVE mg/dL
SPECIFIC GRAVITY, URINE: 1.017 (ref 1.005–1.030)
pH: 5 (ref 5.0–8.0)

## 2017-10-23 NOTE — ED Triage Notes (Signed)
Pt reports burning with urination x 1 week, states, "I'm prone to UTI."  Pt denies fevers, abd pain, vag discharge.

## 2017-10-24 ENCOUNTER — Encounter (HOSPITAL_BASED_OUTPATIENT_CLINIC_OR_DEPARTMENT_OTHER): Payer: Self-pay | Admitting: Emergency Medicine

## 2017-10-24 ENCOUNTER — Emergency Department (HOSPITAL_BASED_OUTPATIENT_CLINIC_OR_DEPARTMENT_OTHER)
Admission: EM | Admit: 2017-10-24 | Discharge: 2017-10-24 | Disposition: A | Discharge status: Home / Self Care | Payer: Medicare HMO | Attending: Emergency Medicine | Admitting: Emergency Medicine

## 2017-10-24 ENCOUNTER — Other Ambulatory Visit: Payer: Self-pay

## 2017-10-24 DIAGNOSIS — A599 Trichomoniasis, unspecified: Secondary | ICD-10-CM | POA: Diagnosis not present

## 2017-10-24 DIAGNOSIS — B9689 Other specified bacterial agents as the cause of diseases classified elsewhere: Secondary | ICD-10-CM | POA: Diagnosis not present

## 2017-10-24 DIAGNOSIS — F17210 Nicotine dependence, cigarettes, uncomplicated: Secondary | ICD-10-CM | POA: Insufficient documentation

## 2017-10-24 DIAGNOSIS — N760 Acute vaginitis: Secondary | ICD-10-CM | POA: Diagnosis not present

## 2017-10-24 DIAGNOSIS — J45909 Unspecified asthma, uncomplicated: Secondary | ICD-10-CM | POA: Insufficient documentation

## 2017-10-24 DIAGNOSIS — Z79899 Other long term (current) drug therapy: Secondary | ICD-10-CM | POA: Diagnosis not present

## 2017-10-24 DIAGNOSIS — N3000 Acute cystitis without hematuria: Secondary | ICD-10-CM | POA: Diagnosis not present

## 2017-10-24 DIAGNOSIS — R300 Dysuria: Secondary | ICD-10-CM | POA: Diagnosis present

## 2017-10-24 LAB — WET PREP, GENITAL
Sperm: NONE SEEN
YEAST WET PREP: NONE SEEN

## 2017-10-24 LAB — URINALYSIS, ROUTINE W REFLEX MICROSCOPIC
Bilirubin Urine: NEGATIVE
GLUCOSE, UA: NEGATIVE mg/dL
Ketones, ur: NEGATIVE mg/dL
Nitrite: NEGATIVE
PROTEIN: NEGATIVE mg/dL
SPECIFIC GRAVITY, URINE: 1.02 (ref 1.005–1.030)
pH: 7 (ref 5.0–8.0)

## 2017-10-24 LAB — URINALYSIS, MICROSCOPIC (REFLEX)

## 2017-10-24 MED ORDER — CEPHALEXIN 250 MG PO CAPS
500.0000 mg | ORAL_CAPSULE | Freq: Once | ORAL | Status: AC
  Administered 2017-10-24: 500 mg via ORAL
  Filled 2017-10-24: qty 2

## 2017-10-24 MED ORDER — METRONIDAZOLE 500 MG PO TABS
500.0000 mg | ORAL_TABLET | Freq: Once | ORAL | Status: AC
  Administered 2017-10-24: 500 mg via ORAL
  Filled 2017-10-24: qty 1

## 2017-10-24 MED ORDER — CEPHALEXIN 500 MG PO CAPS: 500.0000 mg | ORAL_CAPSULE | Freq: Two times a day (BID) | ORAL | 0 refills | Status: DC

## 2017-10-24 MED ORDER — METRONIDAZOLE 500 MG PO TABS: 500.0000 mg | ORAL_TABLET | Freq: Two times a day (BID) | ORAL | 0 refills | Status: AC

## 2017-10-24 NOTE — Discharge Instructions (Signed)
Do not drink alcohol while you are taking flagyl (metronidazole) because it will make you very sick.  Please follow with your primary care doctor in the next 2 days for a check-up. They must obtain records for further management.   Do not hesitate to return to the Emergency Department for any new, worsening or concerning symptoms.

## 2017-10-24 NOTE — ED Notes (Signed)
EMT attempted to take discharge vitals . Patient stated" There is nothing wrong with me blood pressure so you dont need to take it".

## 2017-10-24 NOTE — ED Triage Notes (Signed)
Pt reports burning with urination x 1 week, states, "I'm prone to UTI."  Pt denies fevers, abd pain, vag discharge. - patient has had unprotected intercourse recently

## 2017-10-24 NOTE — ED Provider Notes (Signed)
[facility] EMERGENCY DEPARTMENT Provider Note   CSN: [id number] Arrival date & time: 10/24/17  1116     History   Chief Complaint Chief Complaint  Patient presents with  . Dysuria    HPI   Blood pressure 124/74, pulse 64, temperature 97.9 F (36.6 C), temperature source Oral, resp. rate 20, height 5' 3.5" (1.613 m), weight 115.2 kg (254 lb), SpO2 98 %.  Brittney Sanford is a 51 y.o. female complaining of dysuria and urinary frequency starting 2 weeks ago she also had unprotected sex at that time.  She denies any abnormal vaginal discharge, abdominal pain, fever chills, flank pain but she states she would like to be tested for both UTI and STDs.  Past Medical History:  Diagnosis Date  . Anxiety   . Asthma   . Depression   . Fibromyalgia   . Rheumatoid arthritis (HCC)     There are no active problems to display for this patient.   Past Surgical History:  Procedure Laterality Date  . ABDOMINAL HYSTERECTOMY    . APPENDECTOMY    . BACK SURGERY    . OVARIAN CYST REMOVAL      OB History    No data available       Home Medications    Prior to Admission medications   Medication Sig Start Date End Date Taking? Authorizing Provider  ALPRAZolam Prudy Feeler) 1 MG tablet Take 1 mg by mouth 3 (three) times daily.     [provider]  cephALEXin (KEFLEX) 500 MG capsule Take 1 capsule (500 mg total) by mouth 2 (two) times daily. 10/24/17   Jayin Derousse, Joni Reining, PA-C  chlorpheniramine-HYDROcodone (TUSSIONEX PENNKINETIC ER) 10-8 MG/5ML LQCR Take 5 mLs by mouth every 12 (twelve) hours. 10/21/13   Rolland Porter, MD  chlorpheniramine-HYDROcodone [facility] ER) 10-8 MG/5ML LQCR Take 5 mLs by mouth every 12 (twelve) hours as needed. 08/11/14   Elson Areas, PA-C  estradiol (ESTRACE) 1 MG tablet Take 2 mg by mouth daily.    [provider]  fluticasone (FLONASE) 50 MCG/ACT nasal spray Place 2 sprays into both nostrils daily. 03/09/15   Hess, Nada Boozer,  PA-C  loperamide (IMODIUM) 2 MG capsule Take 1 capsule (2 mg total) by mouth 4 (four) times daily as needed for diarrhea or loose stools. 10/16/12   Quita Skye, MD  metoCLOPramide (REGLAN) 10 MG tablet Take 1 tablet (10 mg total) by mouth every 6 (six) hours. 06/14/11 06/24/11  Eber Hong, MD  metoCLOPramide (REGLAN) 10 MG tablet Take 1 tablet (10 mg total) by mouth 4 (four) times daily as needed (for nausea). 10/16/12   Quita Skye, MD  metroNIDAZOLE (FLAGYL) 500 MG tablet Take 1 tablet (500 mg total) by mouth 2 (two) times daily for 7 days. One tab PO bid x 10 days 10/24/17 10/31/17  Hansen Carino, Joni Reining, PA-C  omeprazole (PRILOSEC) 20 MG capsule Take 20 mg by mouth daily.    [provider]  oxyCODONE-acetaminophen (PERCOCET) 10-325 MG tablet Take 1 tablet by mouth every 4 (four) hours as needed for pain.    [provider]  predniSONE (DELTASONE) 10 MG tablet [id number] taper 08/11/14   Elson Areas, PA-C  promethazine (PHENERGAN) 25 MG tablet Take 1 tablet (25 mg total) by mouth every 6 (six) hours as needed for nausea. 07/16/12 07/23/12  Geoffery Lyons, MD    Family History History reviewed. No pertinent family history.  Social History Social History   Tobacco Use  . Smoking status: Current Every  Day Smoker    Packs/day: 1.00    Types: Cigarettes  . Smokeless tobacco: Never Used  Substance Use Topics  . Alcohol use: No  . Drug use: No     Allergies   Aspirin and Lithium   Review of Systems Review of Systems  A complete review of systems was obtained and all systems are negative except as noted in the HPI and PMH.   Physical Exam Updated Vital Signs BP 124/74 (BP Location: Right Arm)   Pulse 70   Temp 97.9 F (36.6 C) (Oral)   Resp 18   Ht 5' 3.5" (1.613 m)   Wt 115.2 kg (254 lb)   SpO2 100%   BMI 44.29 kg/m   Physical Exam  Constitutional: She is oriented to person, place, and time. She appears well-developed and well-nourished. No  distress.  HENT:  Head: Normocephalic and atraumatic.  Mouth/Throat: Oropharynx is clear and moist.  Eyes: Conjunctivae and EOM are normal. Pupils are equal, round, and reactive to light.  Neck: Normal range of motion.  Cardiovascular: Normal rate, regular rhythm and intact distal pulses.  Pulmonary/Chest: Effort normal and breath sounds normal.  Abdominal: Soft. There is no tenderness.  Genitourinary:  Genitourinary Comments: Exam chaperoned by Technician: No rashes or lesions, no abnormal vaginal discharge, cervix is not visualized secondary to surgical changes from hysterectomy.  No adnexal tenderness.  Musculoskeletal: Normal range of motion.  Neurological: She is alert and oriented to person, place, and time.  Skin: She is not diaphoretic.  Psychiatric: She has a normal mood and affect.  Nursing note and vitals reviewed.    ED Treatments / Results  Labs (all labs ordered are listed, but only abnormal results are displayed) Labs Reviewed  WET PREP, GENITAL - Abnormal; Notable for the following components:      Result Value   Trich, Wet Prep PRESENT (*)    Clue Cells Wet Prep HPF POC PRESENT (*)    WBC, Wet Prep HPF POC MANY (*)    All other components within normal limits  URINALYSIS, ROUTINE W REFLEX MICROSCOPIC - Abnormal; Notable for the following components:   Hgb urine dipstick SMALL (*)    Leukocytes, UA TRACE (*)    All other components within normal limits  URINALYSIS, MICROSCOPIC (REFLEX) - Abnormal; Notable for the following components:   Bacteria, UA MANY (*)    Squamous Epithelial / LPF 0-5 (*)    All other components within normal limits  URINE CULTURE  RPR  HIV ANTIBODY (ROUTINE TESTING)  GC/CHLAMYDIA PROBE AMP ([place]) NOT AT [facility]    EKG  EKG Interpretation None       Radiology No results found.  Procedures Procedures (including critical care time)  Medications Ordered in ED Medications  cephALEXin (KEFLEX) capsule 500 mg (500 mg Oral  Given 10/24/17 1408)  metroNIDAZOLE (FLAGYL) tablet 500 mg (500 mg Oral Given 10/24/17 1408)     Initial Impression / Assessment and Plan / ED Course  I have reviewed the triage vital signs and the nursing notes.  Pertinent labs & imaging results that were available during my care of the patient were reviewed by me and considered in my medical decision making (see chart for details).     Vitals:   10/24/17 1127 10/24/17 1347  BP: 124/74   Pulse: 64 70  Resp: 20 18  Temp: 97.9 F (36.6 C)   TempSrc: Oral   SpO2: 98% 100%  Weight: 115.2 kg (254 lb)   Height:  5' 3.5" (1.613 m)     Medications  cephALEXin (KEFLEX) capsule 500 mg (500 mg Oral Given 10/24/17 1408)  metroNIDAZOLE (FLAGYL) tablet 500 mg (500 mg Oral Given 10/24/17 1408)    Brittney Sanford is 51 y.o. female presenting with dysuria and concern for STDs.  Pelvic exam unremarkable.  Wet prep with trichomoniasis and clue cells.  She states that she has been diagnosed with trichomoniasis in the past but she thinks that her partner did not get treated for it likely reinfection, no signs of GC or chlamydia, offered treatment which she declines.  Referral given to OB/GYN and we have had extensive discussion on the importance of having her partner treated.  Urinalysis equivocal but with her symptoms will treat for UTI urine culture is pending.  Evaluation does not show pathology that would require ongoing emergent intervention or inpatient treatment. Pt is hemodynamically stable and mentating appropriately. Discussed findings and plan with patient/guardian, who agrees with care plan. All questions answered. Return precautions discussed and outpatient follow up given.      Final Clinical Impressions(s) / ED Diagnoses   Final diagnoses:  Acute cystitis without hematuria  Trichomoniasis  Bacterial vaginosis    ED Discharge Orders        Ordered    metroNIDAZOLE (FLAGYL) 500 MG tablet  2 times daily     10/24/17 1407     cephALEXin (KEFLEX) 500 MG capsule  2 times daily     10/24/17 1407       Terrian Sentell, Mardella Layman 10/25/17 4315    Gwyneth Sprout, MD 10/25/17 2038

## 2017-10-24 NOTE — ED Notes (Signed)
Pelvic set up in the room

## 2017-10-25 LAB — URINE CULTURE: Culture: NO GROWTH

## 2017-10-25 LAB — RPR: RPR: NONREACTIVE

## 2017-10-25 LAB — HIV ANTIBODY (ROUTINE TESTING W REFLEX): HIV Screen 4th Generation wRfx: NONREACTIVE

## 2017-10-26 LAB — GC/CHLAMYDIA PROBE AMP (~~LOC~~) NOT AT ARMC
CHLAMYDIA, DNA PROBE: NEGATIVE
NEISSERIA GONORRHEA: NEGATIVE

## 2018-07-12 ENCOUNTER — Encounter (HOSPITAL_BASED_OUTPATIENT_CLINIC_OR_DEPARTMENT_OTHER): Payer: Self-pay

## 2018-07-12 ENCOUNTER — Other Ambulatory Visit: Payer: Self-pay

## 2018-07-12 ENCOUNTER — Emergency Department (HOSPITAL_BASED_OUTPATIENT_CLINIC_OR_DEPARTMENT_OTHER)
Admission: EM | Admit: 2018-07-12 | Discharge: 2018-07-12 | Disposition: A | Discharge status: Home / Self Care | Payer: Medicare HMO | Attending: Emergency Medicine | Admitting: Emergency Medicine

## 2018-07-12 DIAGNOSIS — M542 Cervicalgia: Secondary | ICD-10-CM

## 2018-07-12 DIAGNOSIS — H66001 Acute suppurative otitis media without spontaneous rupture of ear drum, right ear: Secondary | ICD-10-CM | POA: Diagnosis not present

## 2018-07-12 DIAGNOSIS — J449 Chronic obstructive pulmonary disease, unspecified: Secondary | ICD-10-CM | POA: Diagnosis not present

## 2018-07-12 DIAGNOSIS — F17210 Nicotine dependence, cigarettes, uncomplicated: Secondary | ICD-10-CM | POA: Diagnosis not present

## 2018-07-12 DIAGNOSIS — Z79899 Other long term (current) drug therapy: Secondary | ICD-10-CM | POA: Insufficient documentation

## 2018-07-12 DIAGNOSIS — H9203 Otalgia, bilateral: Secondary | ICD-10-CM | POA: Diagnosis present

## 2018-07-12 HISTORY — DX: Chronic obstructive pulmonary disease, unspecified: J44.9

## 2018-07-12 HISTORY — DX: Other chronic pain: G89.29

## 2018-07-12 HISTORY — DX: Dorsalgia, unspecified: M54.9

## 2018-07-12 MED ORDER — AMOXICILLIN 500 MG PO CAPS: 1000.0000 mg | ORAL_CAPSULE | Freq: Two times a day (BID) | ORAL | 0 refills | Status: DC

## 2018-07-12 MED FILL — AMOXICILLIN 500 MG CAPSULE: 500 | 10 days supply | Qty: 40 | Fill #0

## 2018-07-12 NOTE — Discharge Instructions (Signed)
Take 4 over the counter ibuprofen tablets 3 times a day or 2 over-the-counter naproxen tablets twice a day for pain. Also take tylenol 1000mg (2 extra strength) four times a day.    Take your antibiotics as prescribed.  You had some fluid behind the left eardrum, you been having pain in this year for at least a year please call the ear nose and throat doctor that listed in follow-up with them.

## 2018-07-12 NOTE — ED Provider Notes (Signed)
[facility] EMERGENCY DEPARTMENT Provider Note   CSN: [id number] Arrival date & time: 07/12/18  1619     History   Chief Complaint Chief Complaint  Patient presents with  . Otalgia  . Neck Pain    HPI Brittney Sanford is a 52 y.o. female.  52 yo F with a chief complaint of bilateral ear pain.  Going on for the past week or so.  Worse on the right than the left.  She also is having some neck pain with severe right side bending.  She has had a mild cough and congestion.  She states this is chronic issue for her.  States that she is been having pain in the left ear for over a year.  The history is provided by the patient.  Otalgia  This is a chronic problem. The current episode started more than 1 week ago. There is pain in both ears. The problem occurs constantly. The problem has been gradually worsening. There has been no fever. The pain is at a severity of 7/10. The pain is moderate. Associated symptoms include sore throat (scratchy), neck pain and cough. Pertinent negatives include no headaches, no rhinorrhea and no vomiting.  Neck Pain   Pertinent negatives include no chest pain and no headaches.    Past Medical History:  Diagnosis Date  . Anxiety   . Asthma   . Chronic back pain   . COPD (chronic obstructive pulmonary disease) (HCC)   . Depression   . Fibromyalgia   . Rheumatoid arthritis (HCC)     There are no active problems to display for this patient.   Past Surgical History:  Procedure Laterality Date  . ABDOMINAL HYSTERECTOMY    . APPENDECTOMY    . BACK SURGERY    . CHOLECYSTECTOMY    . OVARIAN CYST REMOVAL       OB History   None      Home Medications    Prior to Admission medications   Medication Sig Start Date End Date Taking? Authorizing Provider  ALPRAZolam Prudy Feeler) 1 MG tablet Take 1 mg by mouth 3 (three) times daily.     [provider]  amoxicillin (AMOXIL) 500 MG capsule Take 2 capsules (1,000 mg total) by mouth 2 (two)  times daily. 07/12/18   Melene Plan, DO  cephALEXin (KEFLEX) 500 MG capsule Take 1 capsule (500 mg total) by mouth 2 (two) times daily. 10/24/17   Pisciotta, Joni Reining, PA-C  chlorpheniramine-HYDROcodone (TUSSIONEX PENNKINETIC ER) 10-8 MG/5ML LQCR Take 5 mLs by mouth every 12 (twelve) hours. 10/21/13   Rolland Porter, MD  chlorpheniramine-HYDROcodone [facility] ER) 10-8 MG/5ML LQCR Take 5 mLs by mouth every 12 (twelve) hours as needed. 08/11/14   Elson Areas, PA-C  estradiol (ESTRACE) 1 MG tablet Take 2 mg by mouth daily.    [provider]  fluticasone (FLONASE) 50 MCG/ACT nasal spray Place 2 sprays into both nostrils daily. 03/09/15   Hess, Nada Boozer, PA-C  loperamide (IMODIUM) 2 MG capsule Take 1 capsule (2 mg total) by mouth 4 (four) times daily as needed for diarrhea or loose stools. 10/16/12   Quita Skye, MD  metoCLOPramide (REGLAN) 10 MG tablet Take 1 tablet (10 mg total) by mouth every 6 (six) hours. 06/14/11 06/24/11  Eber Hong, MD  metoCLOPramide (REGLAN) 10 MG tablet Take 1 tablet (10 mg total) by mouth 4 (four) times daily as needed (for nausea). 10/16/12   Quita Skye, MD  omeprazole (PRILOSEC) 20 MG capsule Take 20  mg by mouth daily.    [provider]  oxyCODONE-acetaminophen (PERCOCET) 10-325 MG tablet Take 1 tablet by mouth every 4 (four) hours as needed for pain.    [provider]  predniSONE (DELTASONE) 10 MG tablet 6.5.4.3.2.1 taper 08/11/14   Elson Areas, PA-C  promethazine (PHENERGAN) 25 MG tablet Take 1 tablet (25 mg total) by mouth every 6 (six) hours as needed for nausea. 07/16/12 07/23/12  Geoffery Lyons, MD    Family History No family history on file.  Social History Social History   Tobacco Use  . Smoking status: Current Every Day Smoker    Packs/day: 1.00    Types: Cigarettes  . Smokeless tobacco: Never Used  Substance Use Topics  . Alcohol use: No  . Drug use: No    Frequency: 2.0 times per week     Allergies     Aspirin and Lithium   Review of Systems Review of Systems  Constitutional: Negative for chills and fever.  HENT: Positive for ear pain and sore throat (scratchy). Negative for congestion and rhinorrhea.   Eyes: Negative for redness and visual disturbance.  Respiratory: Positive for cough. Negative for shortness of breath and wheezing.   Cardiovascular: Negative for chest pain and palpitations.  Gastrointestinal: Negative for nausea and vomiting.  Genitourinary: Negative for dysuria and urgency.  Musculoskeletal: Positive for neck pain. Negative for arthralgias and myalgias.  Skin: Negative for pallor and wound.  Neurological: Negative for dizziness and headaches.     Physical Exam Updated Vital Signs BP 137/89 (BP Location: Left Arm)   Pulse 76   Temp 98.3 F (36.8 C) (Oral)   Resp 20   Ht 5\' 4"  (1.626 m)   Wt 109.8 kg   SpO2 99%   BMI 41.54 kg/m   Physical Exam  Constitutional: She is oriented to person, place, and time. She appears well-developed and well-nourished. No distress.  HENT:  Head: Normocephalic and atraumatic.  R TM with purulent effusion, mild bulging, L TM with serous effusion, normal landmarks.    Mild posterior oropharynx erythema, no noted tonsillar swelling. Uvula midline  Eyes: Pupils are equal, round, and reactive to light. EOM are normal.  Neck: Normal range of motion. Neck supple.  Cardiovascular: Normal rate and regular rhythm. Exam reveals no gallop and no friction rub.  No murmur heard. Pulmonary/Chest: Effort normal. She has no wheezes. She has no rales.  Abdominal: Soft. She exhibits no distension. There is no tenderness.  Musculoskeletal: She exhibits no edema or tenderness.  Neurological: She is alert and oriented to person, place, and time.  Skin: Skin is warm and dry. She is not diaphoretic.  Psychiatric: She has a normal mood and affect. Her behavior is normal.  Nursing note and vitals reviewed.    ED Treatments / Results   Labs (all labs ordered are listed, but only abnormal results are displayed) Labs Reviewed - No data to display  EKG None  Radiology No results found.  Procedures Procedures (including critical care time)  Medications Ordered in ED Medications - No data to display   Initial Impression / Assessment and Plan / ED Course  I have reviewed the triage vital signs and the nursing notes.  Pertinent labs & imaging results that were available during my care of the patient were reviewed by me and considered in my medical decision making (see chart for details).     52 yo F with a chief complaint of bilateral ear pain worse on the right  than the left.  Appears to have otitis media.  He is complaining of some muscular neck pain no trauma no red flags.  Will treat otitis.  Have her follow-up with her PCP.  With chronic pain to the left ear we will have her follow-up with ENT.  5:07 PM:  I have discussed the diagnosis/risks/treatment options with the patient and believe the pt to be eligible for discharge home to follow-up with PCP, ENT. We also discussed returning to the ED immediately if new or worsening sx occur. We discussed the sx which are most concerning (e.g., sudden worsening pain, fever, inability to tolerate by mouth) that necessitate immediate return. Medications administered to the patient during their visit and any new prescriptions provided to the patient are listed below.  Medications given during this visit Medications - No data to display   The patient appears reasonably screen and/or stabilized for discharge and I doubt any other medical condition or other [facility] requiring further screening, evaluation, or treatment in the ED at this time prior to discharge.    Final Clinical Impressions(s) / ED Diagnoses   Final diagnoses:  Neck pain  Acute suppurative otitis media of right ear without spontaneous rupture of tympanic membrane, recurrence not specified    ED Discharge  Orders         Ordered    amoxicillin (AMOXIL) 500 MG capsule  2 times daily,   Status:  Discontinued     07/12/18 1702    amoxicillin (AMOXIL) 500 MG capsule  2 times daily     07/12/18 1705           Melene Plan, DO 07/12/18 1707

## 2018-07-12 NOTE — ED Triage Notes (Signed)
Pt c/o bilateral ear and neck pain since yesterday. R>L in severity.

## 2018-08-13 ENCOUNTER — Other Ambulatory Visit: Payer: Self-pay | Admitting: Rehabilitation

## 2018-08-13 DIAGNOSIS — M5106 Intervertebral disc disorders with myelopathy, lumbar region: Secondary | ICD-10-CM

## 2018-08-22 ENCOUNTER — Ambulatory Visit
Admission: RE | Admit: 2018-08-22 | Discharge: 2018-08-22 | Disposition: A | Discharge status: Home / Self Care | Payer: Medicare HMO | Source: Ambulatory Visit | Attending: Rehabilitation | Admitting: Rehabilitation

## 2018-08-22 DIAGNOSIS — M5106 Intervertebral disc disorders with myelopathy, lumbar region: Secondary | ICD-10-CM

## 2018-12-09 ENCOUNTER — Other Ambulatory Visit: Payer: Self-pay | Admitting: Rehabilitation

## 2018-12-09 DIAGNOSIS — I824Y1 Acute embolism and thrombosis of unspecified deep veins of right proximal lower extremity: Secondary | ICD-10-CM

## 2018-12-10 ENCOUNTER — Ambulatory Visit
Admission: RE | Admit: 2018-12-10 | Discharge: 2018-12-10 | Disposition: A | Discharge status: Home / Self Care | Payer: Medicare HMO | Source: Ambulatory Visit | Attending: Rehabilitation | Admitting: Rehabilitation

## 2018-12-10 DIAGNOSIS — I824Y1 Acute embolism and thrombosis of unspecified deep veins of right proximal lower extremity: Secondary | ICD-10-CM

## 2019-07-19 ENCOUNTER — Emergency Department (HOSPITAL_COMMUNITY)
Admission: EM | Admit: 2019-07-19 | Discharge: 2019-07-19 | Disposition: A | Discharge status: Home / Self Care | Payer: Medicare HMO | Attending: Emergency Medicine | Admitting: Emergency Medicine

## 2019-07-19 ENCOUNTER — Emergency Department (HOSPITAL_COMMUNITY): Payer: Medicare HMO

## 2019-07-19 ENCOUNTER — Encounter (HOSPITAL_COMMUNITY): Payer: Self-pay | Admitting: *Deleted

## 2019-07-19 ENCOUNTER — Other Ambulatory Visit: Payer: Self-pay

## 2019-07-19 DIAGNOSIS — G43909 Migraine, unspecified, not intractable, without status migrainosus: Secondary | ICD-10-CM | POA: Insufficient documentation

## 2019-07-19 DIAGNOSIS — F17210 Nicotine dependence, cigarettes, uncomplicated: Secondary | ICD-10-CM | POA: Diagnosis not present

## 2019-07-19 DIAGNOSIS — Z79899 Other long term (current) drug therapy: Secondary | ICD-10-CM | POA: Diagnosis not present

## 2019-07-19 DIAGNOSIS — J449 Chronic obstructive pulmonary disease, unspecified: Secondary | ICD-10-CM | POA: Diagnosis not present

## 2019-07-19 DIAGNOSIS — M069 Rheumatoid arthritis, unspecified: Secondary | ICD-10-CM | POA: Diagnosis not present

## 2019-07-19 DIAGNOSIS — G43009 Migraine without aura, not intractable, without status migrainosus: Secondary | ICD-10-CM

## 2019-07-19 DIAGNOSIS — R51 Headache: Secondary | ICD-10-CM | POA: Diagnosis present

## 2019-07-19 MED ORDER — KETOROLAC TROMETHAMINE 30 MG/ML IJ SOLN
30.0000 mg | Freq: Once | INTRAMUSCULAR | Status: AC
  Administered 2019-07-19: 30 mg via INTRAVENOUS
  Filled 2019-07-19: qty 1

## 2019-07-19 MED ORDER — SODIUM CHLORIDE 0.9 % IV BOLUS
1000.0000 mL | Freq: Once | INTRAVENOUS | Status: AC
  Administered 2019-07-19: 1000 mL via INTRAVENOUS

## 2019-07-19 MED ORDER — PROCHLORPERAZINE EDISYLATE 10 MG/2ML IJ SOLN
10.0000 mg | Freq: Once | INTRAMUSCULAR | Status: AC
  Administered 2019-07-19: 10 mg via INTRAVENOUS
  Filled 2019-07-19: qty 2

## 2019-07-19 MED ORDER — DIPHENHYDRAMINE HCL 50 MG/ML IJ SOLN
12.5000 mg | Freq: Once | INTRAMUSCULAR | Status: AC
  Administered 2019-07-19: 12.5 mg via INTRAVENOUS
  Filled 2019-07-19: qty 1

## 2019-07-19 NOTE — ED Provider Notes (Signed)
[facility] EMERGENCY DEPARTMENT Provider Note   CSN: [id number] Arrival date & time: 07/19/19  0549    History   Chief Complaint Chief Complaint  Patient presents with   Headache    HPI Brittney Sanford is a 53 y.o. female with history of migraine and small vertebral artery aneurysm presenting to the ED complaining of headache for the past week.  Patient describes her pain as a pressure-like sensation that is diffuse.  She reports her pain acutely worsened last night which prompted her to present to the ED.  She reports her pain is intermittently sharp in the left occiput and radiates to the front of her head.  She rates her pain currently as a 10 out of 10.  She reports associated nausea, dizziness, photophobia, and phonophobia.  She reports her symptoms are typical of her prior migraines.  She reports it has been a while since her last migraine and states she is not on any prophylactic treatments.  She denies any recent illness, fever, chills, neck stiffness, numbness, tingling, weakness, vision changes, or any other complaints.      The history is provided by the patient.    Past Medical History:  Diagnosis Date   Anxiety    Asthma    Chronic back pain    COPD (chronic obstructive pulmonary disease) (HCC)    Depression    Fibromyalgia    Rheumatoid arthritis (HCC)     There are no active problems to display for this patient.   Past Surgical History:  Procedure Laterality Date   ABDOMINAL HYSTERECTOMY     APPENDECTOMY     BACK SURGERY     CHOLECYSTECTOMY     OVARIAN CYST REMOVAL       OB History   No obstetric history on file.      Home Medications    Prior to Admission medications   Medication Sig Start Date End Date Taking? Authorizing Provider  ALPRAZolam Prudy Feeler) 1 MG tablet Take 1 mg by mouth 3 (three) times daily.     [provider]  amoxicillin (AMOXIL) 500 MG capsule Take 2 capsules (1,000 mg total) by mouth 2  (two) times daily. 07/12/18   Melene Plan, DO  cephALEXin (KEFLEX) 500 MG capsule Take 1 capsule (500 mg total) by mouth 2 (two) times daily. 10/24/17   Pisciotta, Joni Reining, PA-C  chlorpheniramine-HYDROcodone (TUSSIONEX PENNKINETIC ER) 10-8 MG/5ML LQCR Take 5 mLs by mouth every 12 (twelve) hours. 10/21/13   Rolland Porter, MD  chlorpheniramine-HYDROcodone [facility] ER) 10-8 MG/5ML LQCR Take 5 mLs by mouth every 12 (twelve) hours as needed. 08/11/14   Elson Areas, PA-C  estradiol (ESTRACE) 1 MG tablet Take 2 mg by mouth daily.    [provider]  fluticasone (FLONASE) 50 MCG/ACT nasal spray Place 2 sprays into both nostrils daily. 03/09/15   Hess, Nada Boozer, PA-C  loperamide (IMODIUM) 2 MG capsule Take 1 capsule (2 mg total) by mouth 4 (four) times daily as needed for diarrhea or loose stools. 10/16/12   Quita Skye, MD  metoCLOPramide (REGLAN) 10 MG tablet Take 1 tablet (10 mg total) by mouth every 6 (six) hours. 06/14/11 06/24/11  Eber Hong, MD  metoCLOPramide (REGLAN) 10 MG tablet Take 1 tablet (10 mg total) by mouth 4 (four) times daily as needed (for nausea). 10/16/12   Quita Skye, MD  omeprazole (PRILOSEC) 20 MG capsule Take 20 mg by mouth daily.    [provider]  oxyCODONE-acetaminophen (PERCOCET) 10-325 MG  tablet Take 1 tablet by mouth every 4 (four) hours as needed for pain.    [provider]  predniSONE (DELTASONE) 10 MG tablet [id number] taper 08/11/14   Fransico Meadow, PA-C  promethazine (PHENERGAN) 25 MG tablet Take 1 tablet (25 mg total) by mouth every 6 (six) hours as needed for nausea. 07/16/12 07/23/12  Veryl Speak, MD    Family History No family history on file.  Social History Social History   Tobacco Use   Smoking status: Current Every Day Smoker    Packs/day: 1.00    Types: Cigarettes   Smokeless tobacco: Never Used  Substance Use Topics   Alcohol use: No   Drug use: No    Frequency: 2.0 times per week     Allergies     Aspirin and Lithium   Review of Systems Review of Systems  Constitutional: Negative for chills and fever.  HENT: Positive for ear pain. Negative for sore throat.   Eyes: Negative for pain and visual disturbance.  Respiratory: Negative for cough and shortness of breath.   Cardiovascular: Negative for chest pain and palpitations.  Gastrointestinal: Positive for nausea. Negative for abdominal pain and vomiting.  Genitourinary: Negative for dysuria and hematuria.  Musculoskeletal: Negative for arthralgias and back pain.  Skin: Negative for color change and rash.  Neurological: Positive for dizziness and headaches. Negative for tremors, seizures, syncope, facial asymmetry, speech difficulty, weakness, light-headedness and numbness.  All other systems reviewed and are negative.    Physical Exam Updated Vital Signs BP (!) 137/95 (BP Location: Right Wrist)    Pulse (!) 55    Temp 97.7 F (36.5 C)    Resp 16    SpO2 98%   Physical Exam Vitals signs and nursing note reviewed.  Constitutional:      General: She is not in acute distress.    Appearance: She is obese. She is not ill-appearing, toxic-appearing or diaphoretic.  HENT:     Head: Normocephalic and atraumatic.     Right Ear: Tympanic membrane and ear canal normal.     Left Ear: Tympanic membrane and ear canal normal.     Nose: Nose normal. No congestion or rhinorrhea.     Mouth/Throat:     Mouth: Mucous membranes are moist.     Pharynx: Oropharynx is clear. No oropharyngeal exudate or posterior oropharyngeal erythema.  Eyes:     Extraocular Movements: Extraocular movements intact.     Conjunctiva/sclera: Conjunctivae normal.     Pupils: Pupils are equal, round, and reactive to light.  Neck:     Musculoskeletal: Normal range of motion and neck supple. No neck rigidity or muscular tenderness.  Cardiovascular:     Rate and Rhythm: Normal rate and regular rhythm.     Pulses: Normal pulses.     Heart sounds: Normal heart  sounds. No murmur. No friction rub. No gallop.   Pulmonary:     Effort: Pulmonary effort is normal. No respiratory distress.     Breath sounds: Normal breath sounds. No stridor. No wheezing, rhonchi or rales.  Abdominal:     General: Abdomen is flat. There is no distension.     Palpations: Abdomen is soft.     Tenderness: There is no abdominal tenderness. There is no guarding or rebound.  Musculoskeletal: Normal range of motion.        General: No swelling, tenderness, deformity or signs of injury.  Skin:    General: Skin is warm and dry.  Neurological:  General: No focal deficit present.     Mental Status: She is alert and oriented to person, place, and time. Mental status is at baseline.     Cranial Nerves: No cranial nerve deficit.     Sensory: No sensory deficit.     Motor: No weakness.     Coordination: Coordination normal.  Psychiatric:        Mood and Affect: Mood normal.        Behavior: Behavior normal.      ED Treatments / Results  Labs (all labs ordered are listed, but only abnormal results are displayed) Labs Reviewed - No data to display  EKG None  Radiology Ct Head Wo Contrast  Result Date: 07/19/2019 CLINICAL DATA:  Acute headache with normal neuro exam EXAM: CT HEAD WITHOUT CONTRAST TECHNIQUE: Contiguous axial images were obtained from the base of the skull through the vertex without intravenous contrast. COMPARISON:  06/14/2011 FINDINGS: Brain: No evidence of acute infarction, hemorrhage, hydrocephalus, extra-axial collection or mass lesion/mass effect. Vascular: No hyperdense vessel or unexpected calcification. Skull: Normal. Negative for fracture or focal lesion. Sinuses/Orbits: No acute finding. IMPRESSION: Negative head CT. Electronically Signed   By: Marnee SpringJonathon  Watts M.D.   On: 07/19/2019 07:08    Procedures Procedures (including critical care time)  Medications Ordered in ED Medications  ketorolac (TORADOL) 30 MG/ML injection 30 mg (30 mg  Intravenous Given 07/19/19 0827)  prochlorperazine (COMPAZINE) injection 10 mg (10 mg Intravenous Given 07/19/19 0825)  diphenhydrAMINE (BENADRYL) injection 12.5 mg (12.5 mg Intravenous Given 07/19/19 0823)  sodium chloride 0.9 % bolus 1,000 mL (1,000 mLs Intravenous New Bag/Given 07/19/19 [id number])     Initial Impression / Assessment and Plan / ED Course  I have reviewed the triage vital signs and the nursing notes.  Pertinent labs & imaging results that were available during my care of the patient were reviewed by me and considered in my medical decision making (see chart for details).        Yates DecampRosalind Sanford is a 53 y.o. female with history of migraine and small vertebral artery aneurysm presenting to the ED complaining of headache for the past week.  Patient reports her symptoms are similar to prior migraines.  She has been having symptoms over the past week which acutely worsened last night.  She has no focal neurologic deficits on exam.  There is low concern for subarachnoid hemorrhage at this time as patient reports pain feels like prior headaches, there was no thunderclap component, and she has no focal neurologic deficits.  CT of the head obtained from triage shows no acute intracranial abnormalities.  Patient was given a migraine cocktail of IV Toradol, Compazine, and Benadryl along with a 1 L bolus of normal saline.  On reassessment, patient reports her pain is much improved and now rates it as a 3 out of 10.  She reports she is feeling much better and would like to go home.  Patient was advised to follow-up closely with her primary care physician.  Patient was discharged home in stable condition.   Final Clinical Impressions(s) / ED Diagnoses   Final diagnoses:  Migraine without aura and without status migrainosus, not intractable    ED Discharge Orders    None       Garry HeaterEames, Reagann Dolce, MD 07/19/19 [id number]    Gerhard MunchLockwood, Robert, MD 07/21/19 [id number]

## 2019-07-19 NOTE — ED Triage Notes (Signed)
Pt c/o headache for the past week, worse since last night. Reports "feels like an cement block sitting in my head and I was diagonsed with an aneurysm behind my L ear." Reports mild nausea.

## 2019-12-05 ENCOUNTER — Encounter: Payer: Medicare HMO | Attending: Surgery | Admitting: Dietician

## 2019-12-05 ENCOUNTER — Encounter: Payer: Self-pay | Admitting: Dietician

## 2019-12-05 ENCOUNTER — Other Ambulatory Visit: Payer: Self-pay

## 2019-12-05 DIAGNOSIS — Z713 Dietary counseling and surveillance: Secondary | ICD-10-CM | POA: Insufficient documentation

## 2019-12-05 DIAGNOSIS — E669 Obesity, unspecified: Secondary | ICD-10-CM

## 2019-12-05 NOTE — Progress Notes (Signed)
Nutrition Assessment for Bariatric Surgery Medical Nutrition Therapy  Appt Start Time: 2:30pm    End Time: 3:15pm  Patient was seen on 12/05/2019 for Pre-Operative Nutrition Assessment. Letter of approval faxed to [facility] Surgery bariatric surgery program coordinator on 12/05/2019.   Referral stated Supervised Weight Loss (SWL) visits needed: 6 (some already completed)  Planned surgery: Sleeve Pt expectation of surgery: to be healthier, relieve pain, and feel younger    NUTRITION ASSESSMENT   Anthropometrics  Start weight at [facility]: 281.7 lbs (date: 12/05/2019) Height: 64 in BMI: 48.4 kg/m2    Clinical  Medical hx: asthma, anxiety, COPD, GERD, hypercholesterolemia, obesity, sleep apnea, migraines, chronic back pain Medications: alprazolam, albuterol, dexilant, furosemide, gabapentin, meloxicam, tizanidine, Crestor, flexeril    Lifestyle & Dietary Hx Lives alone so does the food shopping/cooking. Currently not working. Former smoker, quit 1.5 months ago. Unable to be very physically active. Typical meal pattern is 1 primary meal per day plus snacks throughout the day. States she doesn't drink much fluids in a day, been trying to drink more water. May eat out 1/week. Been working on cutting out sugary drinks, including soda.   24-Hr Dietary Recall First Meal: - Snack: - Second Meal: meatloaf + mac & cheese + cabbage  Snack: leftovers from lunch  Third Meal: 2 ice cream sandwiches + 2 oranges  Snack: - Beverages: Kool-Aid, unsweet tea, soda   Estimated Energy Needs Calories: 1600 Carbohydrate: 180g Protein: 100g Fat: 53g   NUTRITION DIAGNOSIS  Overweight/obesity ([place]-3.3) related to past poor dietary habits and physical inactivity as evidenced by patient w/ planned Sleeve Gastrectomy surgery following dietary guidelines for continued weight loss.    NUTRITION INTERVENTION  Nutrition counseling (C-1) and education (E-2) to facilitate bariatric surgery goals.  Pre-Op  Goals Reviewed with the Patient . Track food and beverage intake (pen and paper, MyFitness Pal, Baritastic app, etc.) . Make healthy food choices while monitoring portion sizes . Consume 3 meals per day or try to eat every 3-5 hours . Avoid concentrated sugars and fried foods . Keep sugar & fat in the single digits per serving on food labels . Practice CHEWING your food (aim for applesauce consistency) . Practice not drinking 15 minutes before, during, and 30 minutes after each meal and snack . Avoid all carbonated beverages (ex: soda, sparkling beverages)  . Limit caffeinated beverages (ex: coffee, tea, energy drinks) . Avoid all sugar-sweetened beverages (ex: regular soda, sports drinks)  . Avoid alcohol  . Aim for 64-100 ounces of FLUID daily (with at least half of fluid intake being plain water)  . Aim for at least 60-80 grams of PROTEIN daily . Look for a liquid protein source that contains ?15 g protein and ?5 g carbohydrate (ex: shakes, drinks, shots) . Make a list of non-food related activities . Physical activity is an important part of a healthy lifestyle so keep it moving! The goal is to reach 150 minutes of exercise per week, including cardiovascular and weight baring activity.  Handouts Provided Include  . Bariatric Surgery handouts (Nutrition Visits, Pre-Op Goals, Protein Shakes, Vitamins & Minerals)  Learning Style & Readiness for Change Teaching method utilized: Visual & Auditory  Demonstrated degree of understanding via: Teach Back  Barriers to learning/adherence to lifestyle change: None Identified    MONITORING & EVALUATION Dietary intake, weekly physical activity, body weight, and pre-op goals reached at next nutrition visit.   Next Steps Patient is to return to [facility] in 1 month for 1st SWL Visit.  Patient is to follow up at [facility] for Pre-Op Class (>2 weeks before surgery) for further nutrition education.

## 2019-12-07 ENCOUNTER — Other Ambulatory Visit: Payer: Self-pay | Admitting: Surgery

## 2019-12-07 ENCOUNTER — Other Ambulatory Visit (HOSPITAL_COMMUNITY): Payer: Self-pay | Admitting: Surgery

## 2019-12-07 DIAGNOSIS — Z713 Dietary counseling and surveillance: Secondary | ICD-10-CM

## 2019-12-20 ENCOUNTER — Other Ambulatory Visit (HOSPITAL_COMMUNITY): Payer: Self-pay | Admitting: Surgery

## 2019-12-20 ENCOUNTER — Ambulatory Visit (HOSPITAL_COMMUNITY)
Admission: RE | Admit: 2019-12-20 | Discharge: 2019-12-20 | Disposition: A | Discharge status: Home / Self Care | Payer: Medicare HMO | Source: Ambulatory Visit | Attending: Surgery | Admitting: Surgery

## 2019-12-20 ENCOUNTER — Other Ambulatory Visit: Payer: Self-pay

## 2019-12-20 DIAGNOSIS — Z713 Dietary counseling and surveillance: Secondary | ICD-10-CM

## 2020-01-10 ENCOUNTER — Encounter: Payer: Self-pay | Admitting: Dietician

## 2020-01-10 ENCOUNTER — Encounter: Payer: Medicare HMO | Attending: Surgery | Admitting: Dietician

## 2020-01-10 ENCOUNTER — Other Ambulatory Visit: Payer: Self-pay

## 2020-01-10 DIAGNOSIS — Z713 Dietary counseling and surveillance: Secondary | ICD-10-CM | POA: Insufficient documentation

## 2020-01-10 DIAGNOSIS — E669 Obesity, unspecified: Secondary | ICD-10-CM | POA: Insufficient documentation

## 2020-01-10 NOTE — Progress Notes (Signed)
Supervised Weight Loss Visit Bariatric Nutrition Education Appt Start Time: 10:55am    End Time: 11:15am  Planned Surgery: Sleeve  Pt Expectation of Surgery/ Goals:  to be healthier, relieve pain, and feel younger  1st out of 6 SWL Appointments    NUTRITION ASSESSMENT  Anthropometrics  Start weight at [facility]: 281.7 lbs (date: 12/05/2019) Today's weight: 285 lbs BMI: 48.9 kg/m2    Lifestyle & Dietary Hx States it is difficult to be physically active. Has completely cut out Pepsi, may have a little Sprite occasionally. Has started looking at nutrition facts labels. Will have honey wheat bread because it is less calories than the white bread she typically chooses. States she plans to purchase protein shakes today. Will have fruit canned in juice instead of syrup now. Likes to snack on fruit.   Estimated Energy Needs Calories: 1600 Carbohydrate: 180g Protein: 100g Fat: 53g   NUTRITION DIAGNOSIS  Overweight/obesity ([place]-3.3) related to past poor dietary habits and physical inactivity as evidenced by patient w/ planned Sleeve Gastrectomy surgery following dietary guidelines for continued weight loss.   NUTRITION INTERVENTION  Nutrition counseling (C-1) and education (E-2) to facilitate bariatric surgery goals.  Pre-Op Goals Progress & New Goals . Looking at nutrition facts labels  . NEW: Find appropriate protein shakes  . NEW: Aim to eat at least 3 times/day   Handouts Provided Include   MyPlate Portions   Learning Style & Readiness for Change Teaching method utilized: Visual & Auditory  Demonstrated degree of understanding via: Teach Back  Barriers to learning/adherence to lifestyle change: None Identified    MONITORING & EVALUATION Dietary intake, weekly physical activity, body weight, and pre-op goals in 1 month.   Next Steps  Patient is to return to [facility] in 1 month for 2nd (out of 6) SWL.

## 2020-01-10 NOTE — Patient Instructions (Signed)
   Find some protein shake flavors that you like, these can make great breakfast/snack options!  Aim for at least 3 meals/snacks per day. Try to have your first "meal" within 1-2 hours of waking up.   Remember to look at nutrition facts labels and keep the fat and sugar at 9 grams or less each.

## 2020-02-06 ENCOUNTER — Encounter: Payer: Medicare HMO | Attending: Surgery | Admitting: Dietician

## 2020-02-06 ENCOUNTER — Other Ambulatory Visit: Payer: Self-pay

## 2020-02-06 ENCOUNTER — Encounter: Payer: Self-pay | Admitting: Dietician

## 2020-02-06 DIAGNOSIS — Z79899 Other long term (current) drug therapy: Secondary | ICD-10-CM | POA: Diagnosis not present

## 2020-02-06 DIAGNOSIS — E7800 Pure hypercholesterolemia, unspecified: Secondary | ICD-10-CM | POA: Insufficient documentation

## 2020-02-06 DIAGNOSIS — J45909 Unspecified asthma, uncomplicated: Secondary | ICD-10-CM | POA: Diagnosis not present

## 2020-02-06 DIAGNOSIS — Z87891 Personal history of nicotine dependence: Secondary | ICD-10-CM | POA: Insufficient documentation

## 2020-02-06 DIAGNOSIS — F419 Anxiety disorder, unspecified: Secondary | ICD-10-CM | POA: Insufficient documentation

## 2020-02-06 DIAGNOSIS — G4730 Sleep apnea, unspecified: Secondary | ICD-10-CM | POA: Insufficient documentation

## 2020-02-06 DIAGNOSIS — Z888 Allergy status to other drugs, medicaments and biological substances status: Secondary | ICD-10-CM | POA: Insufficient documentation

## 2020-02-06 DIAGNOSIS — J449 Chronic obstructive pulmonary disease, unspecified: Secondary | ICD-10-CM | POA: Insufficient documentation

## 2020-02-06 DIAGNOSIS — Z886 Allergy status to analgesic agent status: Secondary | ICD-10-CM | POA: Diagnosis not present

## 2020-02-06 DIAGNOSIS — K219 Gastro-esophageal reflux disease without esophagitis: Secondary | ICD-10-CM | POA: Diagnosis not present

## 2020-02-06 DIAGNOSIS — Z713 Dietary counseling and surveillance: Secondary | ICD-10-CM | POA: Insufficient documentation

## 2020-02-06 DIAGNOSIS — Z6841 Body Mass Index (BMI) 40.0 and over, adult: Secondary | ICD-10-CM | POA: Diagnosis not present

## 2020-02-06 DIAGNOSIS — E669 Obesity, unspecified: Secondary | ICD-10-CM

## 2020-02-06 NOTE — Progress Notes (Signed)
Supervised Weight Loss Visit Bariatric Nutrition Education Appt Start Time: 2:45pm    End Time: 3:05pm  Planned Surgery: Sleeve  Pt Expectation of Surgery/ Goals:  to be healthier, relieve pain, and feel younger  2nd out of 6 SWL Appointments    NUTRITION ASSESSMENT  Anthropometrics  Start weight at [facility]: 281.7 lbs (date: 12/05/2019) Today's weight: 286.4 lbs BMI: 49 kg/m2    Lifestyle & Dietary Hx Typical meal pattern is protein shake for breakfast, large meal around 1pm, and 2-3 snacks throughout the day. States she likes Premier Protein shakes and can use these for breakfast, was not having breakfast before. Working on cutting back on added sugar by choosing sugar-free options and using alternative sweeteners such as Stevia. Working on cutting back on portion sizes by measuring out foods, and plans to purchase 9-inch plates.   24-Hr Dietary Recall  First Meal: protein shake  Snack: -  Second Meal: steak + baked potato + salad w/ [employer] lite dressing  Snack: fruit cup  Third Meal: -  Snack: unsweetened applesauce  Beverages: water, sweet tea, Kool-aid made with Stevia   Estimated Energy Needs Calories: 1600 Carbohydrate: 180g Protein: 100g Fat: 53g   NUTRITION DIAGNOSIS  Overweight/obesity ([place]-3.3) related to past poor dietary habits and physical inactivity as evidenced by patient w/ planned Sleeve Gastrectomy surgery following dietary guidelines for continued weight loss.   NUTRITION INTERVENTION  Nutrition counseling (C-1) and education (E-2) to facilitate bariatric surgery goals.  Pre-Op Goals Progress & New Goals . Looking at nutrition facts labels  . Found appropriate protein shakes  . Working on eating at least 3 times/day  . NEW: Incorporate more protein with snacks   Handouts Provided Include   Bariatric Snack Ideas   Learning Style & Readiness for Change Teaching method utilized: Visual & Auditory  Demonstrated degree of understanding via: Teach  Back  Barriers to learning/adherence to lifestyle change: None Identified    MONITORING & EVALUATION Dietary intake, weekly physical activity, body weight, and pre-op goals in 1 month.   Next Steps  Patient is to return to [facility] in 1 month for 3rd (out of 6) SWL.

## 2020-02-06 NOTE — Patient Instructions (Signed)
Use the Snack Ideas sheet for new snack options which focus on protein. Great job on [employer] and incorporating breakfast!

## 2020-02-13 ENCOUNTER — Other Ambulatory Visit (HOSPITAL_COMMUNITY): Payer: Self-pay

## 2020-03-05 ENCOUNTER — Ambulatory Visit: Payer: Medicare HMO | Admitting: Psychology

## 2020-03-08 ENCOUNTER — Other Ambulatory Visit: Payer: Self-pay

## 2020-03-08 ENCOUNTER — Encounter: Payer: Self-pay | Admitting: Dietician

## 2020-03-08 ENCOUNTER — Encounter: Payer: Medicare HMO | Attending: Surgery | Admitting: Dietician

## 2020-03-08 DIAGNOSIS — F17210 Nicotine dependence, cigarettes, uncomplicated: Secondary | ICD-10-CM | POA: Insufficient documentation

## 2020-03-08 DIAGNOSIS — Z6841 Body Mass Index (BMI) 40.0 and over, adult: Secondary | ICD-10-CM | POA: Diagnosis not present

## 2020-03-08 DIAGNOSIS — G8929 Other chronic pain: Secondary | ICD-10-CM | POA: Diagnosis not present

## 2020-03-08 DIAGNOSIS — J449 Chronic obstructive pulmonary disease, unspecified: Secondary | ICD-10-CM | POA: Diagnosis not present

## 2020-03-08 DIAGNOSIS — Z886 Allergy status to analgesic agent status: Secondary | ICD-10-CM | POA: Insufficient documentation

## 2020-03-08 DIAGNOSIS — Z823 Family history of stroke: Secondary | ICD-10-CM | POA: Insufficient documentation

## 2020-03-08 DIAGNOSIS — Z888 Allergy status to other drugs, medicaments and biological substances status: Secondary | ICD-10-CM | POA: Diagnosis not present

## 2020-03-08 DIAGNOSIS — M797 Fibromyalgia: Secondary | ICD-10-CM | POA: Diagnosis not present

## 2020-03-08 DIAGNOSIS — E669 Obesity, unspecified: Secondary | ICD-10-CM

## 2020-03-08 DIAGNOSIS — Z713 Dietary counseling and surveillance: Secondary | ICD-10-CM | POA: Insufficient documentation

## 2020-03-08 DIAGNOSIS — Z8249 Family history of ischemic heart disease and other diseases of the circulatory system: Secondary | ICD-10-CM | POA: Diagnosis not present

## 2020-03-08 DIAGNOSIS — Z8261 Family history of arthritis: Secondary | ICD-10-CM | POA: Insufficient documentation

## 2020-03-08 DIAGNOSIS — Z79899 Other long term (current) drug therapy: Secondary | ICD-10-CM | POA: Diagnosis not present

## 2020-03-08 DIAGNOSIS — G4733 Obstructive sleep apnea (adult) (pediatric): Secondary | ICD-10-CM | POA: Diagnosis not present

## 2020-03-08 NOTE — Progress Notes (Signed)
Supervised Weight Loss Visit Bariatric Nutrition Education  Planned Surgery: Sleeve  Pt Expectation of Surgery/ Goals:  to be healthier, relieve pain, and feel younger  3rd out of 6 SWL Appointments    NUTRITION ASSESSMENT  Anthropometrics  Start weight at [facility]: 281.7 lbs (date: 12/05/2019) Today's weight: 287.5 lbs BMI: 49 kg/m2    Lifestyle & Dietary Hx States she has gone back to only eating ~2 meals per day and maybe 1 snack. Does not have much appetite, and when eats in the morning is forcing self to eat then as well. States she has been off her routine lately while babysitting grandkids. May have cereal with 2 % milk or protein shake for breakfast. Avoiding beef. Purchased an indoor grill and likes making chicken and various meals with this. Will use Stevia to sweeten if needed.   24-Hr Dietary Recall  First Meal: protein shake  Snack: -  Second Meal: -  Snack: chips  Third Meal: 4pm: chicken + cabbage  Snack: -  Beverages: Lipton peach tea (16 grams sugar), yogi tea, 2% milk    Estimated Energy Needs Calories: 1600 Carbohydrate: 180g Protein: 100g Fat: 53g   NUTRITION DIAGNOSIS  Overweight/obesity ([place]-3.3) related to past poor dietary habits and physical inactivity as evidenced by patient w/ planned Sleeve Gastrectomy surgery following dietary guidelines for continued weight loss.   NUTRITION INTERVENTION  Nutrition counseling (C-1) and education (E-2) to facilitate bariatric surgery goals.  Pre-Op Goals Progress & New Goals . Looking at nutrition facts labels  . Found appropriate protein shakes  . Working on eating at least 3 times/day  . Working on incorporating more protein with snacks  . NEW: Focus on fluid intake, especially increasing overall daily intake   Learning Style & Readiness for Change Teaching method utilized: Visual & Auditory  Demonstrated degree of understanding via: Teach Back  Barriers to learning/adherence to lifestyle change: None  Identified    MONITORING & EVALUATION Dietary intake, weekly physical activity, body weight, and pre-op goals in 1 month.   Next Steps  Patient is to return to [facility] in 1 month for 4th (out of 6) SWL.

## 2020-03-08 NOTE — Patient Instructions (Signed)
Focus on fluid intake this next month. Remember the ultimate goal is 64 ounces of fluid per day, ideally with at least half (32 ounces) being plain water.   Try sugar-free options that you like for something with flavor  Try adding fruit to water for a hint of flavor  Milk and yogi tea are always options!   Drink your water at the temperature you more prefer :)   Get started early in the day  Have something with you and available to drink at all times

## 2020-03-19 ENCOUNTER — Ambulatory Visit: Payer: Medicare HMO | Admitting: Psychology

## 2020-04-05 ENCOUNTER — Encounter: Payer: Medicare HMO | Attending: Surgery | Admitting: Dietician

## 2020-04-05 ENCOUNTER — Encounter: Payer: Self-pay | Admitting: Dietician

## 2020-04-05 ENCOUNTER — Other Ambulatory Visit: Payer: Self-pay

## 2020-04-05 DIAGNOSIS — E669 Obesity, unspecified: Secondary | ICD-10-CM

## 2020-04-05 DIAGNOSIS — J449 Chronic obstructive pulmonary disease, unspecified: Secondary | ICD-10-CM | POA: Diagnosis not present

## 2020-04-05 DIAGNOSIS — K219 Gastro-esophageal reflux disease without esophagitis: Secondary | ICD-10-CM | POA: Insufficient documentation

## 2020-04-05 DIAGNOSIS — Z888 Allergy status to other drugs, medicaments and biological substances status: Secondary | ICD-10-CM | POA: Diagnosis not present

## 2020-04-05 DIAGNOSIS — Z6841 Body Mass Index (BMI) 40.0 and over, adult: Secondary | ICD-10-CM | POA: Diagnosis not present

## 2020-04-05 DIAGNOSIS — F419 Anxiety disorder, unspecified: Secondary | ICD-10-CM | POA: Insufficient documentation

## 2020-04-05 DIAGNOSIS — Z79899 Other long term (current) drug therapy: Secondary | ICD-10-CM | POA: Diagnosis not present

## 2020-04-05 DIAGNOSIS — J45909 Unspecified asthma, uncomplicated: Secondary | ICD-10-CM | POA: Insufficient documentation

## 2020-04-05 DIAGNOSIS — E7800 Pure hypercholesterolemia, unspecified: Secondary | ICD-10-CM | POA: Insufficient documentation

## 2020-04-05 DIAGNOSIS — Z886 Allergy status to analgesic agent status: Secondary | ICD-10-CM | POA: Diagnosis not present

## 2020-04-05 DIAGNOSIS — G4730 Sleep apnea, unspecified: Secondary | ICD-10-CM | POA: Diagnosis not present

## 2020-04-05 DIAGNOSIS — Z87891 Personal history of nicotine dependence: Secondary | ICD-10-CM | POA: Diagnosis not present

## 2020-04-05 DIAGNOSIS — Z713 Dietary counseling and surveillance: Secondary | ICD-10-CM | POA: Diagnosis present

## 2020-04-05 NOTE — Patient Instructions (Signed)
Great job on drinking all your WATER!!   Aim to eat at least 3 times per day, even if it is a small snack.

## 2020-04-05 NOTE — Progress Notes (Signed)
Supervised Weight Loss Visit Bariatric Nutrition Education  Planned Surgery: Sleeve  Pt Expectation of Surgery/ Goals:  to be healthier, relieve pain, and feel younger  4th out of 6 SWL Appointments    NUTRITION ASSESSMENT  Anthropometrics  Start weight at [facility]: 281.7 lbs (date: 12/05/2019) Today's weight: 288.7 lbs BMI: 49 kg/m2    Lifestyle & Dietary Hx States she eats 1 meal per day (lunch) and maybe 1 snack in the evening. Does not have much appetite, and when eats in the morning is forcing self to eat then as well. States she can do cereal with lowfat milk in the morning for breakfast sometimes. Foods eaten include fish, seafood, lasagna, red meat, or fast food. May snack on a fruit cup or chocolate pudding. Purchased a gallon sized water bottle and drinks the whole gallon every day! States this has helped her cravings.   24-Hr Dietary Recall  First Meal: - Snack: -  Second Meal: [employer] lasagna + salad  Snack: -  Third Meal: fruit cup  Snack: -  Beverages: water    Estimated Energy Needs Calories: 1600 Carbohydrate: 180g Protein: 100g Fat: 53g   NUTRITION DIAGNOSIS  Overweight/obesity ([place]-3.3) related to past poor dietary habits and physical inactivity as evidenced by patient w/ planned Sleeve Gastrectomy surgery following dietary guidelines for continued weight loss.   NUTRITION INTERVENTION  Nutrition counseling (C-1) and education (E-2) to facilitate bariatric surgery goals.  Pre-Op Goals Progress & New Goals . Looking at nutrition facts labels  . Found appropriate protein shakes  . Working on eating at least 3 times/day  . Working on incorporating more protein with snacks  . Drinking a gallon of water every day!    Learning Style & Readiness for Change Teaching method utilized: Visual & Auditory  Demonstrated degree of understanding via: Teach Back  Barriers to learning/adherence to lifestyle change: None Identified    MONITORING & EVALUATION Dietary  intake, weekly physical activity, body weight, and pre-op goals in 1 month.   Next Steps  Patient is to return to [facility] in 1 month for 5th (out of 6) SWL.

## 2020-05-08 ENCOUNTER — Encounter: Payer: Medicare HMO | Attending: Surgery | Admitting: Dietician

## 2020-05-08 ENCOUNTER — Encounter: Payer: Self-pay | Admitting: Dietician

## 2020-05-08 ENCOUNTER — Other Ambulatory Visit: Payer: Self-pay

## 2020-05-08 DIAGNOSIS — Z713 Dietary counseling and surveillance: Secondary | ICD-10-CM | POA: Insufficient documentation

## 2020-05-08 DIAGNOSIS — Z6841 Body Mass Index (BMI) 40.0 and over, adult: Secondary | ICD-10-CM | POA: Insufficient documentation

## 2020-05-08 DIAGNOSIS — E669 Obesity, unspecified: Secondary | ICD-10-CM

## 2020-05-08 NOTE — Progress Notes (Signed)
Supervised Weight Loss Visit Bariatric Nutrition Education  Planned Surgery: Sleeve  Pt Expectation of Surgery/ Goals:  to be healthier, relieve pain, and feel younger  5th out of 6 SWL Appointments    NUTRITION ASSESSMENT  Anthropometrics  Start weight at [facility]: 281.7 lbs (date: 12/05/2019) Today's weight: 286.5 lbs BMI: 49 kg/m2    Lifestyle & Dietary Hx Patient has done great with adding in more meals throughout the day, was previously only eating once per day. Continues to do well with drinking lots of water and has eliminated soda! States she enjoys eating tuna and eggs for protein and is trying chicken in different ways (baked, grilled, seasoned, etc) to find ways she likes it. Eats lots of vegetables. Has started back at the gym swimming a couple days per week (states this is best for her knee.) Very excited for surgery and to maintain healthy eating habits/ lifestyle changes to be healthier.   24-Hr Dietary Recall  First Meal: 2 eggs Snack: -  Second Meal: Stouffer's lasagna + salad  Snack: -  Third Meal: fruit cup  Snack: -  Beverages: water, coffee, unsweet tea w/ Truvia   Estimated Energy Needs Calories: 1600 Carbohydrate: 180g Protein: 100g Fat: 53g   NUTRITION DIAGNOSIS  Overweight/obesity ([place]-3.3) related to past poor dietary habits and physical inactivity as evidenced by patient w/ planned Sleeve Gastrectomy surgery following dietary guidelines for continued weight loss.   NUTRITION INTERVENTION  Nutrition counseling (C-1) and education (E-2) to facilitate bariatric surgery goals.  Pre-Op Goals Progress & New Goals . Looking at nutrition facts labels  . Found appropriate protein shakes  . Eating at least 3 times/day including breakfast! . Working on incorporating more protein with snacks  . Drinking a gallon of water every day!   . Swimming throughout the week  Learning Style & Readiness for Change Teaching method utilized: Visual & Auditory   Demonstrated degree of understanding via: Teach Back  Barriers to learning/adherence to lifestyle change: None Identified    MONITORING & EVALUATION Dietary intake, weekly physical activity, body weight, and pre-op goals in 1 month.   Next Steps  Patient is to return to [facility] in 1 month for 6th (out of 6) SWL.

## 2020-05-24 ENCOUNTER — Other Ambulatory Visit: Payer: Self-pay

## 2020-05-24 ENCOUNTER — Encounter (HOSPITAL_COMMUNITY): Payer: Self-pay

## 2020-05-24 ENCOUNTER — Emergency Department (HOSPITAL_COMMUNITY)
Admission: EM | Admit: 2020-05-24 | Discharge: 2020-05-24 | Disposition: A | Discharge status: Home / Self Care | Payer: Medicare HMO | Attending: Emergency Medicine | Admitting: Emergency Medicine

## 2020-05-24 ENCOUNTER — Emergency Department (HOSPITAL_COMMUNITY): Payer: Medicare HMO

## 2020-05-24 DIAGNOSIS — Z5321 Procedure and treatment not carried out due to patient leaving prior to being seen by health care provider: Secondary | ICD-10-CM | POA: Diagnosis not present

## 2020-05-24 DIAGNOSIS — R519 Headache, unspecified: Secondary | ICD-10-CM | POA: Insufficient documentation

## 2020-05-24 LAB — DIFFERENTIAL
Abs Immature Granulocytes: 0.02 10*3/uL (ref 0.00–0.07)
Basophils Absolute: 0 10*3/uL (ref 0.0–0.1)
Basophils Relative: 1 %
Eosinophils Absolute: 0.1 10*3/uL (ref 0.0–0.5)
Eosinophils Relative: 2 %
Immature Granulocytes: 0 %
Lymphocytes Relative: 34 %
Lymphs Abs: 2.8 10*3/uL (ref 0.7–4.0)
Monocytes Absolute: 0.5 10*3/uL (ref 0.1–1.0)
Monocytes Relative: 7 %
Neutro Abs: 4.8 10*3/uL (ref 1.7–7.7)
Neutrophils Relative %: 56 %

## 2020-05-24 LAB — COMPREHENSIVE METABOLIC PANEL
ALT: 33 U/L (ref 0–44)
AST: 35 U/L (ref 15–41)
Albumin: 3.7 g/dL (ref 3.5–5.0)
Alkaline Phosphatase: 93 U/L (ref 38–126)
Anion gap: 10 (ref 5–15)
BUN: 16 mg/dL (ref 6–20)
CO2: 25 mmol/L (ref 22–32)
Calcium: 9.7 mg/dL (ref 8.9–10.3)
Chloride: 104 mmol/L (ref 98–111)
Creatinine, Ser: 1.04 mg/dL — ABNORMAL HIGH (ref 0.44–1.00)
GFR calc Af Amer: >60 mL/min (ref >60)
GFR calc non Af Amer: >60 mL/min (ref >60)
Glucose, Bld: 129 mg/dL — ABNORMAL HIGH (ref 70–99)
Potassium: 4.1 mmol/L (ref 3.5–5.1)
Sodium: 139 mmol/L (ref 135–145)
Total Bilirubin: 0.8 mg/dL (ref 0.3–1.2)
Total Protein: 6.6 g/dL (ref 6.5–8.1)

## 2020-05-24 LAB — CBC
HCT: 42.1 % (ref 36.0–46.0)
Hemoglobin: 13.8 g/dL (ref 12.0–15.0)
MCH: 29.8 pg (ref 26.0–34.0)
MCHC: 32.8 g/dL (ref 30.0–36.0)
MCV: 90.9 fL (ref 80.0–100.0)
Platelets: 203 10*3/uL (ref 150–400)
RBC: 4.63 MIL/uL (ref 3.87–5.11)
RDW: 14.9 % (ref 11.5–15.5)
WBC: 8.3 10*3/uL (ref 4.0–10.5)
nRBC: 0 % (ref 0.0–0.2)

## 2020-05-24 MED ORDER — SODIUM CHLORIDE 0.9% FLUSH: 3.0000 mL | Freq: Once | INTRAVENOUS | Status: DC

## 2020-05-24 NOTE — ED Notes (Signed)
Pt LWBS. Pt stated she couldn't wait any longer.  

## 2020-05-24 NOTE — ED Triage Notes (Signed)
Pt arrives to ED w/ c/o migraine x 8 days. Pt denies neuro symptoms. AOx4. Pt reports 8/10 pain and endorses intermittent nausea.

## 2020-06-07 ENCOUNTER — Other Ambulatory Visit: Payer: Self-pay

## 2020-06-07 ENCOUNTER — Encounter: Payer: Self-pay | Admitting: Dietician

## 2020-06-07 ENCOUNTER — Encounter: Payer: Medicare HMO | Attending: Surgery | Admitting: Dietician

## 2020-06-07 DIAGNOSIS — Z713 Dietary counseling and surveillance: Secondary | ICD-10-CM | POA: Insufficient documentation

## 2020-06-07 DIAGNOSIS — E669 Obesity, unspecified: Secondary | ICD-10-CM

## 2020-06-07 DIAGNOSIS — Z6841 Body Mass Index (BMI) 40.0 and over, adult: Secondary | ICD-10-CM | POA: Diagnosis not present

## 2020-06-07 NOTE — Progress Notes (Signed)
Supervised Weight Loss Visit Bariatric Nutrition Education  Planned Surgery: Sleeve  Pt Expectation of Surgery/ Goals:  to be healthier, relieve pain, and feel younger  6th out of 6 SWL Appointments    NUTRITION ASSESSMENT  Anthropometrics  Start weight at [facility]: 281.7 lbs (date: 12/05/2019) Today's weight: 289.5 lbs BMI: 49.7 kg/m2    Lifestyle & Dietary Hx Patient has done great with eating multiple meals/snacks per day which is an improvement from originally eating once per day. Monitors calories and food choices such as measuring out mayo and choosing canned fruits with no added sugar. States she tested out a liquid diet yesterday to see how her body would respond to this, knowing that she will be on a temporary liquid diet after surgery. Liquids included protein shakes, water, cream of chicken soup, and broth.     24-Hr Dietary Recall  First Meal: protein shake  Snack: canned peaches no sugar added Second Meal: tuna fish w/ mayo & pickles + low fat wheat crackers   Snack: -  Third Meal: fried pork chop + green beans   Snack: -  Beverages: water, coffee, decaf unsweet tea w/ Truvia   Estimated Energy Needs Calories: 1600 Carbohydrate: 180g Protein: 100g Fat: 53g   NUTRITION DIAGNOSIS  Overweight/obesity ([place]-3.3) related to past poor dietary habits and physical inactivity as evidenced by patient w/ planned Sleeve Gastrectomy surgery following dietary guidelines for continued weight loss.   NUTRITION INTERVENTION  Nutrition counseling (C-1) and education (E-2) to facilitate bariatric surgery goals.  Pre-Op Goals Progress & New Goals . Looking at nutrition facts labels  . Found appropriate protein shakes  . Eating at least 3 times/day including breakfast! . Working on incorporating more protein with snacks  . Drinking a gallon of water every day!   . Swimming throughout the week  Learning Style & Readiness for Change Teaching method utilized: Visual & Auditory   Demonstrated degree of understanding via: Teach Back  Barriers to learning/adherence to lifestyle change: None Identified    MONITORING & EVALUATION Dietary intake, weekly physical activity, body weight, and pre-op goals at next nutrition visit.   Next Steps  Patient is to return to [facility] for Pre-Op Class ~2 weeks prior to surgery.

## 2020-06-27 ENCOUNTER — Ambulatory Visit (INDEPENDENT_AMBULATORY_CARE_PROVIDER_SITE_OTHER): Payer: Medicare HMO | Admitting: Psychology

## 2020-06-27 DIAGNOSIS — F509 Eating disorder, unspecified: Secondary | ICD-10-CM | POA: Diagnosis not present

## 2020-07-04 ENCOUNTER — Ambulatory Visit (INDEPENDENT_AMBULATORY_CARE_PROVIDER_SITE_OTHER): Payer: Medicare HMO | Admitting: Psychology

## 2020-07-04 DIAGNOSIS — F509 Eating disorder, unspecified: Secondary | ICD-10-CM | POA: Diagnosis not present

## 2020-07-31 ENCOUNTER — Other Ambulatory Visit (HOSPITAL_COMMUNITY): Payer: Self-pay | Admitting: Surgery

## 2020-07-31 ENCOUNTER — Other Ambulatory Visit: Payer: Self-pay | Admitting: Surgery

## 2020-08-15 ENCOUNTER — Other Ambulatory Visit: Payer: Self-pay

## 2020-08-15 ENCOUNTER — Ambulatory Visit (HOSPITAL_COMMUNITY)
Admission: RE | Admit: 2020-08-15 | Discharge: 2020-08-15 | Disposition: A | Discharge status: Home / Self Care | Payer: Medicare HMO | Source: Ambulatory Visit | Attending: Surgery | Admitting: Surgery

## 2020-10-22 ENCOUNTER — Other Ambulatory Visit: Payer: Self-pay

## 2020-10-22 ENCOUNTER — Encounter: Payer: Medicare HMO | Attending: Surgery | Admitting: Skilled Nursing Facility1

## 2020-10-22 DIAGNOSIS — E669 Obesity, unspecified: Secondary | ICD-10-CM

## 2020-10-22 NOTE — Progress Notes (Signed)
Pre-Operative Nutrition Class:  Appt start time: 3794   End time:  1830.  Patient was seen on 10/22/2020 for Pre-Operative Bariatric Surgery Education at [facility].    Surgery date: 12/10/2020 Surgery type: Sleeve Start weight at [facility]: 281.7 Weight today: 267.6lbs  Samples given per MNT protocol. Patient educated on appropriate usage: ProCare Multivitamin Lot [id number] Exp:12/2021  Celebrate Calcium  Lot #[id number] Exp:Apr-14-2023   Protein20  Shake Lot #[id number] Exp: 12/13/2021  The following the learning objectives were met by the patient during this course:  Identify Pre-Op Dietary Goals and will begin 2 weeks pre-operatively  Identify appropriate sources of fluids and proteins   State protein recommendations and appropriate sources pre and post-operatively  Identify Post-Operative Dietary Goals and will follow for 2 weeks post-operatively  Identify appropriate multivitamin and calcium sources  Describe the need for physical activity post-operatively and will follow MD recommendations  State when to call healthcare provider regarding medication questions or post-operative complications  Handouts given during class include:  Pre-Op Bariatric Surgery Diet Handout  Protein Shake Handout  Post-Op Bariatric Surgery Nutrition Handout  BELT Program Information Flyer  Support Group Information Flyer  [facility] Bariatric Supplements Price List  Follow-Up Plan: Patient will follow-up at [facility] 2 weeks post operatively for diet advancement per MD.

## 2020-11-22 ENCOUNTER — Telehealth: Payer: Self-pay | Admitting: Skilled Nursing Facility1

## 2020-11-22 NOTE — Telephone Encounter (Signed)
Pt called with pre-op questions.   Dietitian answered pts questions to her satisfaction.  

## 2020-12-04 ENCOUNTER — Other Ambulatory Visit: Payer: Self-pay

## 2020-12-04 ENCOUNTER — Encounter (HOSPITAL_COMMUNITY)
Admission: RE | Admit: 2020-12-04 | Discharge: 2020-12-04 | Disposition: A | Discharge status: Home / Self Care | Payer: Medicare HMO | Source: Ambulatory Visit | Attending: Surgery | Admitting: Surgery

## 2020-12-04 ENCOUNTER — Encounter (HOSPITAL_COMMUNITY): Payer: Self-pay

## 2020-12-04 DIAGNOSIS — Z01812 Encounter for preprocedural laboratory examination: Secondary | ICD-10-CM | POA: Diagnosis not present

## 2020-12-04 HISTORY — DX: Anemia, unspecified: D64.9

## 2020-12-04 HISTORY — DX: Aneurysm of unspecified site: I72.9

## 2020-12-04 HISTORY — DX: Headache, unspecified: R51.9

## 2020-12-04 LAB — CBC
HCT: 42.8 % (ref 36.0–46.0)
Hemoglobin: 14.3 g/dL (ref 12.0–15.0)
MCH: 30.1 pg (ref 26.0–34.0)
MCHC: 33.4 g/dL (ref 30.0–36.0)
MCV: 90.1 fL (ref 80.0–100.0)
Platelets: 167 10*3/uL (ref 150–400)
RBC: 4.75 MIL/uL (ref 3.87–5.11)
RDW: 14.8 % (ref 11.5–15.5)
WBC: 5.8 10*3/uL (ref 4.0–10.5)
nRBC: 0 % (ref 0.0–0.2)

## 2020-12-04 LAB — BASIC METABOLIC PANEL
Anion gap: 11 (ref 5–15)
BUN: 8 mg/dL (ref 6–20)
CO2: 23 mmol/L (ref 22–32)
Calcium: 10 mg/dL (ref 8.9–10.3)
Chloride: 107 mmol/L (ref 98–111)
Creatinine, Ser: 0.81 mg/dL (ref 0.44–1.00)
GFR, Estimated: >60 mL/min (ref >60)
Glucose, Bld: 98 mg/dL (ref 70–99)
Potassium: 3.8 mmol/L (ref 3.5–5.1)
Sodium: 141 mmol/L (ref 135–145)

## 2020-12-04 NOTE — Psychosocial Assessment (Signed)
DUE TO COVID-19 ONLY ONE VISITOR IS ALLOWED TO COME WITH YOU AND STAY IN THE WAITING ROOM ONLY DURING PRE OP AND PROCEDURE DAY OF SURGERY. THE 1 VISITOR  MAY VISIT WITH YOU AFTER SURGERY IN YOUR PRIVATE ROOM DURING VISITING HOURS ONLY!  YOU NEED TO HAVE A COVID 19 TEST ON_1/05/2021 ______ @__1000_____ , THIS TEST MUST BE DONE BEFORE SURGERY,  COVID TESTING SITE [address] [place] , IT IS ON THE RIGHT GOING OUT [address] APPROXIMATELY  2 MINUTES PAST [employer] ON THE RIGHT. ONCE YOUR COVID TEST IS COMPLETED,  PLEASE BEGIN THE QUARANTINE INSTRUCTIONS AS OUTLINED IN YOUR HANDOUT.                Brittney Sanford  12/04/2020   Your procedure is scheduled on: 12/10/2020    Report to [facility] Main  Entrance   Report to admitting at 0530  AM     Call this number if you have problems the morning of surgery [phone]    Remember: Do not eat food , candy gum or mints :After Midnight. You may have clear liquids from midnight until 0430    CLEAR LIQUID DIET   Foods Allowed                                                                       Coffee and tea, regular and decaf                              Plain Jell-O any favor except red or purple                                            Fruit ices (not with fruit pulp)                                      Iced Popsicles                                     Carbonated beverages, regular and diet                                    Cranberry, grape and apple juices Sports drinks like Gatorade Lightly seasoned clear broth or consume(fat free) Sugar, honey syrup   _____________________________________________________________________    BRUSH YOUR TEETH MORNING OF SURGERY AND RINSE YOUR MOUTH OUT, NO CHEWING GUM CANDY OR MINTS.     Take these medicines the morning of surgery with A SIP OF WATER:   DO NOT TAKE ANY DIABETIC MEDICATIONS DAY OF YOUR SURGERY                               You  may not have any metal on your body including hair pins and  piercings  Do not wear jewelry, make-up, lotions, powders or perfumes, deodorant             Do not wear nail polish on your fingernails.  Do not shave  48 hours prior to surgery.              Men may shave face and neck.   Do not bring valuables to the hospital. [place].  Contacts, dentures or bridgework may not be worn into surgery.  Leave suitcase in the car. After surgery it may be brought to your room.     Patients discharged the day of surgery will not be allowed to drive home. IF YOU ARE HAVING SURGERY AND GOING HOME THE SAME DAY, YOU MUST HAVE AN ADULT TO DRIVE YOU HOME AND BE WITH YOU FOR 24 HOURS. YOU MAY GO HOME BY TAXI OR UBER OR ORTHERWISE, BUT AN ADULT MUST ACCOMPANY YOU HOME AND STAY WITH YOU FOR 24 HOURS.  Name and phone number of your driver:  Special Instructions: N/A              Please read over the following fact sheets you were given: _____________________________________________________________________  [facility] - Preparing for Surgery Before surgery, you can play an important role.  Because skin is not sterile, your skin needs to be as free of germs as possible.  You can reduce the number of germs on your skin by washing with CHG (chlorahexidine gluconate) soap before surgery.  CHG is an antiseptic cleaner which kills germs and bonds with the skin to continue killing germs even after washing. Please DO NOT use if you have an allergy to CHG or antibacterial soaps.  If your skin becomes reddened/irritated stop using the CHG and inform your nurse when you arrive at Short Stay. Do not shave (including legs and underarms) for at least 48 hours prior to the first CHG shower.  You may shave your face/neck. Please follow these instructions carefully:  1.  Shower with CHG Soap the night before surgery and the  morning of Surgery.  2.  If you choose to  wash your hair, wash your hair first as usual with your  normal  shampoo.  3.  After you shampoo, rinse your hair and body thoroughly to remove the  shampoo.                           4.  Use CHG as you would any other liquid soap.  You can apply chg directly  to the skin and wash                       Gently with a scrungie or clean washcloth.  5.  Apply the CHG Soap to your body ONLY FROM THE NECK DOWN.   Do not use on face/ open                           Wound or open sores. Avoid contact with eyes, ears mouth and genitals (private parts).                       Wash face,  Genitals (private parts) with your normal soap.             6.  Wash thoroughly, paying special attention to the area where your surgery  will be performed.  7.  Thoroughly rinse your body with warm water from the neck down.  8.  DO NOT shower/wash with your normal soap after using and rinsing off  the CHG Soap.                9.  Pat yourself dry with a clean towel.            10.  Wear clean pajamas.            11.  Place clean sheets on your bed the night of your first shower and do not  sleep with pets. Day of Surgery : Do not apply any lotions/deodorants the morning of surgery.  Please wear clean clothes to the hospital/surgery center.  FAILURE TO FOLLOW THESE INSTRUCTIONS MAY RESULT IN THE CANCELLATION OF YOUR SURGERY PATIENT SIGNATURE_________________________________  NURSE SIGNATURE__________________________________  ________________________________________________________________________

## 2020-12-04 NOTE — Progress Notes (Signed)
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

## 2020-12-05 ENCOUNTER — Encounter (HOSPITAL_COMMUNITY): Payer: Self-pay | Admitting: Physician Assistant

## 2020-12-06 ENCOUNTER — Other Ambulatory Visit (HOSPITAL_COMMUNITY)
Admission: RE | Admit: 2020-12-06 | Discharge: 2020-12-06 | Disposition: A | Discharge status: Home / Self Care | Payer: Medicare HMO | Source: Ambulatory Visit | Attending: Surgery | Admitting: Surgery

## 2020-12-06 DIAGNOSIS — Z01812 Encounter for preprocedural laboratory examination: Secondary | ICD-10-CM | POA: Insufficient documentation

## 2020-12-06 DIAGNOSIS — U071 COVID-19: Secondary | ICD-10-CM | POA: Insufficient documentation

## 2020-12-06 LAB — SARS CORONAVIRUS 2 (TAT 6-24 HRS): SARS Coronavirus 2: POSITIVE — AB

## 2020-12-10 ENCOUNTER — Encounter (HOSPITAL_COMMUNITY): Admission: RE | Payer: Self-pay | Source: Ambulatory Visit

## 2020-12-10 ENCOUNTER — Inpatient Hospital Stay (HOSPITAL_COMMUNITY): Admission: RE | Admit: 2020-12-10 | Payer: Medicare HMO | Source: Ambulatory Visit | Admitting: Surgery

## 2020-12-10 SURGERY — GASTRECTOMY, SLEEVE, LAPAROSCOPIC
Anesthesia: General

## 2020-12-24 NOTE — Patient Instructions (Addendum)
DUE TO COVID-19 ONLY ONE VISITOR IS ALLOWED TO COME WITH YOU AND STAY IN THE WAITING ROOM ONLY DURING PRE OP AND PROCEDURE.   IF YOU WILL BE ADMITTED INTO THE HOSPITAL YOU ARE ALLOWED ONE SUPPORT PERSON DURING VISITATION HOURS ONLY (10AM -8PM)   . The support person may change daily. . The support person must pass our screening, gel in and out, and wear a mask at all times, including in the patient's room. . Patients must also wear a mask when staff or their support person are in the room.          Your procedure is scheduled on:  Monday, 01-07-21   Report to [facility] Main  Entrance   Report to admitting at 8:00 AM   Call this number if you have problems the morning of surgery [phone]   Do not eat food :After Midnight.   May have liquids until 7:00 AM  day of surgery  CLEAR LIQUID DIET  Foods Allowed                                                                     Foods Excluded  Water, Black Coffee and tea, regular and decaf             liquids that you cannot  Plain Jell-O in any flavor  (No red)                                    see through such as: Fruit ices (not with fruit pulp)                                      milk, soups, orange juice              Iced Popsicles (No red)                                      All solid food                                   Apple juices Sports drinks like Gatorade (No red) Lightly seasoned clear broth or consume(fat free) Sugar, honey syrup      Oral Hygiene is also important to reduce your risk of infection.                                    Remember - BRUSH YOUR TEETH THE MORNING OF SURGERY WITH YOUR REGULAR TOOTHPASTE   Do NOT smoke after Midnight   Take these medicines the morning of surgery with A SIP OF WATER:  Dexilant, Gabapentin, Latuda, Linzess, Alprazolam if needed, okay to  use inahlers  and bring with you day of surgery  You may not have any metal on your body  including hair pins, jewelry, and body piercings             Do not wear make-up, lotions, powders, perfumes/cologne, or deodorant             Do not wear nail polish.  Do not shave  48 hours prior to surgery.            Do not bring valuables to the hospital. [facility] IS NOT RESPONSIBLE   FOR VALUABLES.   Contacts, dentures or bridgework may not be worn into surgery.   Bring small overnight bag day of surgery.                Please read over the following fact sheets you were given: IF YOU HAVE QUESTIONS ABOUT YOUR PRE OP INSTRUCTIONS PLEASE CALL  [phone] [facility] - Preparing for Surgery Before surgery, you can play an important role.  Because skin is not sterile, your skin needs to be as free of germs as possible.  You can reduce the number of germs on your skin by washing with CHG (chlorahexidine gluconate) soap before surgery.  CHG is an antiseptic cleaner which kills germs and bonds with the skin to continue killing germs even after washing. Please DO NOT use if you have an allergy to CHG or antibacterial soaps.  If your skin becomes reddened/irritated stop using the CHG and inform your nurse when you arrive at Short Stay. Do not shave (including legs and underarms) for at least 48 hours prior to the first CHG shower.  You may shave your face/neck.  Please follow these instructions carefully:  1.  Shower with CHG Soap the night before surgery and the  morning of surgery.  2.  If you choose to wash your hair, wash your hair first as usual with your normal  shampoo.  3.  After you shampoo, rinse your hair and body thoroughly to remove the shampoo.                             4.  Use CHG as you would any other liquid soap.  You can apply chg directly to the skin and wash.  Gently with a scrungie or clean washcloth.  5.  Apply the CHG Soap to your body ONLY FROM THE NECK DOWN.   Do   not use on face/ open                           Wound or open sores. Avoid contact with  eyes, ears mouth and   genitals (private parts).                       Wash face,  Genitals (private parts) with your normal soap.             6.  Wash thoroughly, paying special attention to the area where your    surgery  will be performed.  7.  Thoroughly rinse your body with warm water from the neck down.  8.  DO NOT shower/wash with your normal soap after using and rinsing off the CHG Soap.                9.  Pat yourself dry with a clean towel.  10.  Wear clean pajamas.            11.  Place clean sheets on your bed the night of your first shower and do not  sleep with pets. Day of Surgery : Do not apply any lotions/deodorants the morning of surgery.  Please wear clean clothes to the hospital/surgery center.  FAILURE TO FOLLOW THESE INSTRUCTIONS MAY RESULT IN THE CANCELLATION OF YOUR SURGERY  PATIENT SIGNATURE_________________________________  NURSE SIGNATURE__________________________________  ________________________________________________________________________

## 2020-12-24 NOTE — Progress Notes (Addendum)
COVID Vaccine Completed:  x2 Date COVID Vaccine completed:  06-28-20, 08-11-20 COVID vaccine manufacturer: Pfizer    Moderna   [employer]   PCP - Barney Drain, MD Cardiologist -   Chest x-ray - 08-15-20 in Epic EKG - 08-15-20 in Epic Stress Test -  ECHO -  Cardiac Cath -  Pacemaker/ICD device last checked:  Sleep Study - 2+ years ago, +sleep apnea CPAP - Yes  Fasting Blood Sugar - N/A Checks Blood Sugar _____ times a day  Blood Thinner Instructions: Aspirin Instructions: Last Dose:  Anesthesia review:  COPD, pseudoaneursym (behind left ear per pt), sleep apnea  Patient denies shortness of breath, fever, cough and chest pain at PAT appointment.  Pt able to climb a flight of stairs and do housework.   Patient verbalized understanding of instructions that were given to them at the PAT appointment. Patient was also instructed that they will need to review over the PAT instructions again at home before surgery.

## 2020-12-25 ENCOUNTER — Ambulatory Visit: Payer: Medicare HMO

## 2020-12-27 ENCOUNTER — Encounter (HOSPITAL_COMMUNITY)
Admission: RE | Admit: 2020-12-27 | Discharge: 2020-12-27 | Disposition: A | Discharge status: Home / Self Care | Payer: Medicare HMO | Source: Ambulatory Visit | Attending: Surgery | Admitting: Surgery

## 2020-12-27 ENCOUNTER — Other Ambulatory Visit: Payer: Self-pay

## 2020-12-27 ENCOUNTER — Encounter (HOSPITAL_COMMUNITY): Payer: Self-pay

## 2020-12-27 DIAGNOSIS — Z01812 Encounter for preprocedural laboratory examination: Secondary | ICD-10-CM | POA: Diagnosis not present

## 2020-12-27 LAB — BASIC METABOLIC PANEL
Anion gap: 11 (ref 5–15)
BUN: 14 mg/dL (ref 6–20)
CO2: 24 mmol/L (ref 22–32)
Calcium: 10 mg/dL (ref 8.9–10.3)
Chloride: 103 mmol/L (ref 98–111)
Creatinine, Ser: 0.81 mg/dL (ref 0.44–1.00)
GFR, Estimated: >60 mL/min (ref >60)
Glucose, Bld: 116 mg/dL — ABNORMAL HIGH (ref 70–99)
Potassium: 4.2 mmol/L (ref 3.5–5.1)
Sodium: 138 mmol/L (ref 135–145)

## 2020-12-27 LAB — CBC
HCT: 43.2 % (ref 36.0–46.0)
Hemoglobin: 14.7 g/dL (ref 12.0–15.0)
MCH: 30.5 pg (ref 26.0–34.0)
MCHC: 34 g/dL (ref 30.0–36.0)
MCV: 89.6 fL (ref 80.0–100.0)
Platelets: 194 10*3/uL (ref 150–400)
RBC: 4.82 MIL/uL (ref 3.87–5.11)
RDW: 14.9 % (ref 11.5–15.5)
WBC: 10.2 10*3/uL (ref 4.0–10.5)
nRBC: 0 % (ref 0.0–0.2)

## 2021-01-06 NOTE — H&P (Signed)
Brittney Sanford Location: [facility] Patient #: [id number] DOB: 21-Nov-1966 Single / Language: [place] / Race: Black or African American Female   History of Present Illness  The patient is a 55 year old female who presents for a bariatric surgery evaluation. Brittney Sanford is here for a preop for her lap sleeve gastrectomy. She understands the procedure well and has no questions for me. She has had multiple abdominal operations including a lap chole and multiple pelvic surgeries. She denies DVT.   UGI was read as normal although she has had GER in the past.    Allergies (Jacqueline Haggett, RMA;  Aspirin *ANALGESICS - NonNarcotic*  Lithium *ANTIPSYCHOTICS/ANTIMANIC AGENTS*  Allergies Reconciled   Medication History  Albuterol Sulfate ((2.5 MG/3ML)0.083% Nebulized Soln, Inhalation) Active. Albuterol Sulfate HFA (108 (90 Base)MCG/ACT Aerosol Soln, Inhalation) Active. Dexilant (60MG  Capsule DR, Oral) Active. Furosemide (20MG  Tablet, Oral) Active. Gabapentin (300MG  Capsule, Oral) Active. Meloxicam (7.5MG  Tablet, Oral) Active. Crestor (5MG  Tablet, Oral) Active. Flexeril (5MG  Tablet, Oral) Active. Medications Reconciled  Vitals  Weight: 274.4 lb Height: 63in Waist: 52in Neck: 16in  Body Surface Area: 2.21 m Body Mass Index: 48.61 kg/m  Temp.: 98.56F (Temporal)  Pulse: 111 (Regular)  P.OX: 97% (Room air) BP: 130/86(Sitting, Left Arm, Standard)   Physical Exam   Note: Obese AAF HEENT unremarkable Neck supple Chest clear  Heart SR Abdomen-nontender Ext FROM     Assessment & Plan  MORBID OBESITY (E66.01) Impression: Ready for sleeve gastrectomy  Matt B. , MD, FACS

## 2021-01-07 ENCOUNTER — Inpatient Hospital Stay (HOSPITAL_COMMUNITY): Payer: Medicare HMO | Admitting: Physician Assistant

## 2021-01-07 ENCOUNTER — Encounter (HOSPITAL_COMMUNITY)
Admission: RE | Disposition: A | Discharge status: Home / Self Care | Payer: Self-pay | Source: Home / Self Care | Attending: Surgery

## 2021-01-07 ENCOUNTER — Other Ambulatory Visit: Payer: Self-pay

## 2021-01-07 ENCOUNTER — Encounter (HOSPITAL_COMMUNITY): Payer: Self-pay | Admitting: Surgery

## 2021-01-07 ENCOUNTER — Inpatient Hospital Stay (HOSPITAL_COMMUNITY): Payer: Medicare HMO | Admitting: Registered Nurse

## 2021-01-07 ENCOUNTER — Inpatient Hospital Stay (HOSPITAL_COMMUNITY)
Admission: RE | Admit: 2021-01-07 | Discharge: 2021-01-10 | DRG: 621 | Disposition: A | Discharge status: Home / Self Care | Payer: Medicare HMO | Attending: Surgery | Admitting: Surgery

## 2021-01-07 DIAGNOSIS — F32A Depression, unspecified: Secondary | ICD-10-CM | POA: Diagnosis present

## 2021-01-07 DIAGNOSIS — Z90710 Acquired absence of both cervix and uterus: Secondary | ICD-10-CM

## 2021-01-07 DIAGNOSIS — M797 Fibromyalgia: Secondary | ICD-10-CM | POA: Diagnosis present

## 2021-01-07 DIAGNOSIS — Z6841 Body Mass Index (BMI) 40.0 and over, adult: Secondary | ICD-10-CM

## 2021-01-07 DIAGNOSIS — Z886 Allergy status to analgesic agent status: Secondary | ICD-10-CM | POA: Diagnosis not present

## 2021-01-07 DIAGNOSIS — K449 Diaphragmatic hernia without obstruction or gangrene: Secondary | ICD-10-CM | POA: Diagnosis present

## 2021-01-07 DIAGNOSIS — M545 Low back pain, unspecified: Secondary | ICD-10-CM | POA: Diagnosis present

## 2021-01-07 DIAGNOSIS — Z791 Long term (current) use of non-steroidal anti-inflammatories (NSAID): Secondary | ICD-10-CM

## 2021-01-07 DIAGNOSIS — Z9884 Bariatric surgery status: Secondary | ICD-10-CM

## 2021-01-07 DIAGNOSIS — G8929 Other chronic pain: Secondary | ICD-10-CM | POA: Diagnosis present

## 2021-01-07 DIAGNOSIS — R110 Nausea: Secondary | ICD-10-CM | POA: Diagnosis not present

## 2021-01-07 DIAGNOSIS — F17210 Nicotine dependence, cigarettes, uncomplicated: Secondary | ICD-10-CM | POA: Diagnosis present

## 2021-01-07 DIAGNOSIS — K219 Gastro-esophageal reflux disease without esophagitis: Secondary | ICD-10-CM | POA: Diagnosis present

## 2021-01-07 DIAGNOSIS — J449 Chronic obstructive pulmonary disease, unspecified: Secondary | ICD-10-CM | POA: Diagnosis present

## 2021-01-07 DIAGNOSIS — G4730 Sleep apnea, unspecified: Secondary | ICD-10-CM | POA: Diagnosis present

## 2021-01-07 DIAGNOSIS — M069 Rheumatoid arthritis, unspecified: Secondary | ICD-10-CM | POA: Diagnosis present

## 2021-01-07 DIAGNOSIS — F419 Anxiety disorder, unspecified: Secondary | ICD-10-CM | POA: Diagnosis present

## 2021-01-07 DIAGNOSIS — Z888 Allergy status to other drugs, medicaments and biological substances status: Secondary | ICD-10-CM

## 2021-01-07 DIAGNOSIS — Z79899 Other long term (current) drug therapy: Secondary | ICD-10-CM

## 2021-01-07 HISTORY — PX: LAPAROSCOPIC GASTRIC SLEEVE RESECTION: SHX5895

## 2021-01-07 HISTORY — PX: UPPER GI ENDOSCOPY: SHX6162

## 2021-01-07 LAB — CBC WITH DIFFERENTIAL/PLATELET
Abs Immature Granulocytes: 0.01 10*3/uL (ref 0.00–0.07)
Basophils Absolute: 0 10*3/uL (ref 0.0–0.1)
Basophils Relative: 0 %
Eosinophils Absolute: 0.1 10*3/uL (ref 0.0–0.5)
Eosinophils Relative: 1 %
HCT: 43 % (ref 36.0–46.0)
Hemoglobin: 14.2 g/dL (ref 12.0–15.0)
Immature Granulocytes: 0 %
Lymphocytes Relative: 28 %
Lymphs Abs: 2.5 10*3/uL (ref 0.7–4.0)
MCH: 29.8 pg (ref 26.0–34.0)
MCHC: 33 g/dL (ref 30.0–36.0)
MCV: 90.3 fL (ref 80.0–100.0)
Monocytes Absolute: 0.5 10*3/uL (ref 0.1–1.0)
Monocytes Relative: 6 %
Neutro Abs: 5.8 10*3/uL (ref 1.7–7.7)
Neutrophils Relative %: 65 %
Platelets: 207 10*3/uL (ref 150–400)
RBC: 4.76 MIL/uL (ref 3.87–5.11)
RDW: 15.1 % (ref 11.5–15.5)
WBC: 9 10*3/uL (ref 4.0–10.5)
nRBC: 0 % (ref 0.0–0.2)

## 2021-01-07 LAB — COMPREHENSIVE METABOLIC PANEL
ALT: 30 U/L (ref 0–44)
AST: 32 U/L (ref 15–41)
Albumin: 4.3 g/dL (ref 3.5–5.0)
Alkaline Phosphatase: 78 U/L (ref 38–126)
Anion gap: 12 (ref 5–15)
BUN: 11 mg/dL (ref 6–20)
CO2: 20 mmol/L — ABNORMAL LOW (ref 22–32)
Calcium: 9.8 mg/dL (ref 8.9–10.3)
Chloride: 104 mmol/L (ref 98–111)
Creatinine, Ser: 0.85 mg/dL (ref 0.44–1.00)
GFR, Estimated: >60 mL/min (ref >60)
Glucose, Bld: 96 mg/dL (ref 70–99)
Potassium: 4.2 mmol/L (ref 3.5–5.1)
Sodium: 136 mmol/L (ref 135–145)
Total Bilirubin: 0.9 mg/dL (ref 0.3–1.2)
Total Protein: 7.8 g/dL (ref 6.5–8.1)

## 2021-01-07 LAB — HEMOGLOBIN AND HEMATOCRIT, BLOOD
HCT: 37.6 % (ref 36.0–46.0)
Hemoglobin: 12.2 g/dL (ref 12.0–15.0)

## 2021-01-07 LAB — TYPE AND SCREEN
ABO/RH(D): O POS
Antibody Screen: NEGATIVE

## 2021-01-07 LAB — ABO/RH: ABO/RH(D): O POS

## 2021-01-07 SURGERY — GASTRECTOMY, SLEEVE, LAPAROSCOPIC
Anesthesia: General

## 2021-01-07 MED ORDER — EPHEDRINE SULFATE-NACL 50-0.9 MG/10ML-% IV SOSY
PREFILLED_SYRINGE | INTRAVENOUS | Status: DC | PRN
  Administered 2021-01-07 (×2): 10 mg via INTRAVENOUS

## 2021-01-07 MED ORDER — HEPARIN SODIUM (PORCINE) 5000 UNIT/ML IJ SOLN
5000.0000 [IU] | INTRAMUSCULAR | Status: AC
  Administered 2021-01-07: 5000 [IU] via SUBCUTANEOUS
  Filled 2021-01-07: qty 1

## 2021-01-07 MED ORDER — ONDANSETRON HCL 4 MG/2ML IJ SOLN
4.0000 mg | INTRAMUSCULAR | Status: DC | PRN
  Administered 2021-01-07 – 2021-01-10 (×4): 4 mg via INTRAVENOUS
  Filled 2021-01-07 (×4): qty 2

## 2021-01-07 MED ORDER — APREPITANT 40 MG PO CAPS
40.0000 mg | ORAL_CAPSULE | ORAL | Status: AC
  Administered 2021-01-07: 40 mg via ORAL
  Filled 2021-01-07: qty 1

## 2021-01-07 MED ORDER — PANTOPRAZOLE SODIUM 40 MG IV SOLR
40.0000 mg | Freq: Every day | INTRAVENOUS | Status: DC
  Administered 2021-01-07 – 2021-01-09 (×3): 40 mg via INTRAVENOUS
  Filled 2021-01-07 (×3): qty 40

## 2021-01-07 MED ORDER — LACTATED RINGERS IR SOLN
Status: DC | PRN
  Administered 2021-01-07: 3000 mL

## 2021-01-07 MED ORDER — ACETAMINOPHEN 500 MG PO TABS
1000.0000 mg | ORAL_TABLET | ORAL | Status: AC
  Administered 2021-01-07: 1000 mg via ORAL
  Filled 2021-01-07: qty 2

## 2021-01-07 MED ORDER — FENTANYL CITRATE (PF) 100 MCG/2ML IJ SOLN
INTRAMUSCULAR | Status: DC | PRN
  Administered 2021-01-07 (×2): 50 ug via INTRAVENOUS

## 2021-01-07 MED ORDER — LIDOCAINE HCL (PF) 2 % IJ SOLN
INTRAMUSCULAR | Status: AC
  Filled 2021-01-07: qty 5

## 2021-01-07 MED ORDER — MIDAZOLAM HCL 2 MG/2ML IJ SOLN
INTRAMUSCULAR | Status: AC
  Filled 2021-01-07: qty 2

## 2021-01-07 MED ORDER — CHLORHEXIDINE GLUCONATE 0.12 % MT SOLN
15.0000 mL | Freq: Once | OROMUCOSAL | Status: AC
  Administered 2021-01-07: 15 mL via OROMUCOSAL

## 2021-01-07 MED ORDER — OXYCODONE HCL 5 MG PO TABS: 5.0000 mg | ORAL_TABLET | Freq: Once | ORAL | Status: DC | PRN

## 2021-01-07 MED ORDER — HYDROMORPHONE HCL 1 MG/ML IJ SOLN
INTRAMUSCULAR | Status: AC
  Administered 2021-01-07: 0.25 mg via INTRAVENOUS
  Filled 2021-01-07: qty 1

## 2021-01-07 MED ORDER — MEPERIDINE HCL 50 MG/ML IJ SOLN: 6.2500 mg | INTRAMUSCULAR | Status: DC | PRN

## 2021-01-07 MED ORDER — PHENYLEPHRINE 40 MCG/ML (10ML) SYRINGE FOR IV PUSH (FOR BLOOD PRESSURE SUPPORT)
PREFILLED_SYRINGE | INTRAVENOUS | Status: DC | PRN
  Administered 2021-01-07 (×2): 120 ug via INTRAVENOUS

## 2021-01-07 MED ORDER — LACTATED RINGERS IV SOLN: INTRAVENOUS | Status: DC

## 2021-01-07 MED ORDER — SODIUM CHLORIDE 0.9 % IV SOLN
2.0000 g | INTRAVENOUS | Status: AC
  Administered 2021-01-07: 2 g via INTRAVENOUS
  Filled 2021-01-07: qty 2

## 2021-01-07 MED ORDER — PROMETHAZINE HCL 25 MG/ML IJ SOLN: 6.2500 mg | INTRAMUSCULAR | Status: DC | PRN

## 2021-01-07 MED ORDER — HEPARIN SODIUM (PORCINE) 5000 UNIT/ML IJ SOLN
5000.0000 [IU] | Freq: Three times a day (TID) | INTRAMUSCULAR | Status: DC
  Administered 2021-01-08 – 2021-01-09 (×3): 5000 [IU] via SUBCUTANEOUS
  Filled 2021-01-07 (×6): qty 1

## 2021-01-07 MED ORDER — MORPHINE SULFATE (PF) 2 MG/ML IV SOLN
1.0000 mg | INTRAVENOUS | Status: DC | PRN
Start: 2021-01-07 — End: 2021-01-10

## 2021-01-07 MED ORDER — KCL IN DEXTROSE-NACL 20-5-0.45 MEQ/L-%-% IV SOLN
INTRAVENOUS | Status: DC
  Filled 2021-01-07 (×5): qty 1000

## 2021-01-07 MED ORDER — BUPIVACAINE LIPOSOME 1.3 % IJ SUSP
20.0000 mL | Freq: Once | INTRAMUSCULAR | Status: DC
  Filled 2021-01-07: qty 20

## 2021-01-07 MED ORDER — SODIUM CHLORIDE 0.9 % IR SOLN
Status: DC | PRN
  Administered 2021-01-07: 1000 mL

## 2021-01-07 MED ORDER — ACETAMINOPHEN 160 MG/5ML PO SOLN
1000.0000 mg | Freq: Three times a day (TID) | ORAL | Status: DC
  Administered 2021-01-07: 1000 mg via ORAL
  Filled 2021-01-07 (×2): qty 40.6

## 2021-01-07 MED ORDER — OXYCODONE HCL 5 MG/5ML PO SOLN
5.0000 mg | Freq: Once | ORAL | Status: DC | PRN
Start: 2021-01-07 — End: 2021-01-07

## 2021-01-07 MED ORDER — SUGAMMADEX SODIUM 500 MG/5ML IV SOLN
INTRAVENOUS | Status: DC | PRN
  Administered 2021-01-07: 300 mg via INTRAVENOUS

## 2021-01-07 MED ORDER — KETAMINE HCL 10 MG/ML IJ SOLN
INTRAMUSCULAR | Status: DC | PRN
  Administered 2021-01-07 (×2): 15 mg via INTRAVENOUS

## 2021-01-07 MED ORDER — PROMETHAZINE HCL 25 MG/ML IJ SOLN
INTRAMUSCULAR | Status: AC
  Administered 2021-01-07: 6.25 mg via INTRAVENOUS
  Filled 2021-01-07: qty 1

## 2021-01-07 MED ORDER — ENSURE MAX PROTEIN PO LIQD
2.0000 [oz_av] | ORAL | Status: DC
  Administered 2021-01-09 – 2021-01-10 (×4): 2 [oz_av] via ORAL

## 2021-01-07 MED ORDER — GABAPENTIN 300 MG PO CAPS
300.0000 mg | ORAL_CAPSULE | ORAL | Status: AC
  Administered 2021-01-07: 300 mg via ORAL
  Filled 2021-01-07: qty 1

## 2021-01-07 MED ORDER — ACETAMINOPHEN 500 MG PO TABS
1000.0000 mg | ORAL_TABLET | Freq: Three times a day (TID) | ORAL | Status: DC
  Filled 2021-01-07: qty 2

## 2021-01-07 MED ORDER — ONDANSETRON HCL 4 MG/2ML IJ SOLN
INTRAMUSCULAR | Status: DC | PRN
  Administered 2021-01-07: 4 mg via INTRAVENOUS

## 2021-01-07 MED ORDER — HYDROMORPHONE HCL 1 MG/ML IJ SOLN
0.2500 mg | INTRAMUSCULAR | Status: DC | PRN
  Administered 2021-01-07: 0.25 mg via INTRAVENOUS
  Administered 2021-01-07: 0.5 mg via INTRAVENOUS

## 2021-01-07 MED ORDER — BUPIVACAINE LIPOSOME 1.3 % IJ SUSP
INTRAMUSCULAR | Status: DC | PRN
  Administered 2021-01-07: 20 mL

## 2021-01-07 MED ORDER — DEXAMETHASONE SODIUM PHOSPHATE 10 MG/ML IJ SOLN
INTRAMUSCULAR | Status: DC | PRN
  Administered 2021-01-07: 8 mg via INTRAVENOUS

## 2021-01-07 MED ORDER — DEXAMETHASONE SODIUM PHOSPHATE 10 MG/ML IJ SOLN
INTRAMUSCULAR | Status: AC
  Filled 2021-01-07: qty 1

## 2021-01-07 MED ORDER — OXYCODONE HCL 5 MG/5ML PO SOLN
5.0000 mg | Freq: Four times a day (QID) | ORAL | Status: DC | PRN
  Filled 2021-01-07 (×2): qty 5

## 2021-01-07 MED ORDER — ROCURONIUM BROMIDE 10 MG/ML (PF) SYRINGE
PREFILLED_SYRINGE | INTRAVENOUS | Status: DC | PRN
  Administered 2021-01-07: 50 mg via INTRAVENOUS
  Administered 2021-01-07: 20 mg via INTRAVENOUS

## 2021-01-07 MED ORDER — SODIUM CHLORIDE (PF) 0.9 % IJ SOLN
INTRAMUSCULAR | Status: AC
  Filled 2021-01-07: qty 10

## 2021-01-07 MED ORDER — LIDOCAINE HCL 2 % IJ SOLN
INTRAMUSCULAR | Status: AC
  Filled 2021-01-07: qty 20

## 2021-01-07 MED ORDER — PROPOFOL 10 MG/ML IV BOLUS
INTRAVENOUS | Status: DC | PRN
  Administered 2021-01-07: 200 mg via INTRAVENOUS

## 2021-01-07 MED ORDER — MIDAZOLAM HCL 5 MG/5ML IJ SOLN
INTRAMUSCULAR | Status: DC | PRN
  Administered 2021-01-07: 2 mg via INTRAVENOUS

## 2021-01-07 MED ORDER — EPHEDRINE 5 MG/ML INJ
INTRAVENOUS | Status: AC
  Filled 2021-01-07: qty 10

## 2021-01-07 MED ORDER — FENTANYL CITRATE (PF) 100 MCG/2ML IJ SOLN
INTRAMUSCULAR | Status: AC
  Filled 2021-01-07: qty 2

## 2021-01-07 MED ORDER — PROPOFOL 10 MG/ML IV BOLUS
INTRAVENOUS | Status: AC
  Filled 2021-01-07: qty 20

## 2021-01-07 MED ORDER — SODIUM CHLORIDE (PF) 0.9 % IJ SOLN
INTRAMUSCULAR | Status: DC | PRN
  Administered 2021-01-07: 10 mL

## 2021-01-07 MED ORDER — CHLORHEXIDINE GLUCONATE CLOTH 2 % EX PADS: 6.0000 | MEDICATED_PAD | Freq: Once | CUTANEOUS | Status: DC

## 2021-01-07 MED ORDER — ONDANSETRON HCL 4 MG/2ML IJ SOLN
INTRAMUSCULAR | Status: AC
  Filled 2021-01-07: qty 2

## 2021-01-07 MED ORDER — LIDOCAINE 2% (20 MG/ML) 5 ML SYRINGE
INTRAMUSCULAR | Status: DC | PRN
  Administered 2021-01-07: 60 mg via INTRAVENOUS
  Administered 2021-01-07: 1.5 mg/kg/h via INTRAVENOUS

## 2021-01-07 MED ORDER — SUGAMMADEX SODIUM 500 MG/5ML IV SOLN
INTRAVENOUS | Status: AC
  Filled 2021-01-07: qty 5

## 2021-01-07 MED ORDER — METOPROLOL TARTRATE 5 MG/5ML IV SOLN: 5.0000 mg | Freq: Four times a day (QID) | INTRAVENOUS | Status: DC | PRN

## 2021-01-07 MED ORDER — ORAL CARE MOUTH RINSE: 15.0000 mL | Freq: Once | OROMUCOSAL | Status: AC

## 2021-01-07 MED ORDER — ROCURONIUM BROMIDE 10 MG/ML (PF) SYRINGE
PREFILLED_SYRINGE | INTRAVENOUS | Status: AC
  Filled 2021-01-07: qty 10

## 2021-01-07 MED ORDER — SCOPOLAMINE 1 MG/3DAYS TD PT72
1.0000 | MEDICATED_PATCH | TRANSDERMAL | Status: DC
  Administered 2021-01-07: 1.5 mg via TRANSDERMAL
  Filled 2021-01-07: qty 1

## 2021-01-07 SURGICAL SUPPLY — 60 items
APPLICATOR COTTON TIP 6 STRL (MISCELLANEOUS) IMPLANT
APPLICATOR COTTON TIP 6IN STRL (MISCELLANEOUS)
APPLIER CLIP 5 13 M/L LIGAMAX5 (MISCELLANEOUS)
APPLIER CLIP ROT 10 11.4 M/L (STAPLE)
APPLIER CLIP ROT 13.4 12 LRG (CLIP)
BLADE SURG 15 STRL LF DISP TIS (BLADE) ×1 IMPLANT
BLADE SURG 15 STRL SS (BLADE) ×1
CABLE HIGH FREQUENCY MONO STRZ (ELECTRODE) IMPLANT
CLIP APPLIE 5 13 M/L LIGAMAX5 (MISCELLANEOUS) IMPLANT
CLIP APPLIE ROT 10 11.4 M/L (STAPLE) IMPLANT
CLIP APPLIE ROT 13.4 12 LRG (CLIP) IMPLANT
COVER WAND RF STERILE (DRAPES) IMPLANT
DECANTER SPIKE VIAL GLASS SM (MISCELLANEOUS) ×2 IMPLANT
DERMABOND ADVANCED (GAUZE/BANDAGES/DRESSINGS) ×1
DERMABOND ADVANCED .7 DNX12 (GAUZE/BANDAGES/DRESSINGS) ×1 IMPLANT
DEVICE SUT QUICK LOAD TK 5 (STAPLE) ×4 IMPLANT
DEVICE SUT TI-KNOT TK 5X26 (MISCELLANEOUS) ×2 IMPLANT
DEVICE SUTURE ENDOST 10MM (ENDOMECHANICALS) ×2 IMPLANT
DISSECTOR BLUNT TIP ENDO 5MM (MISCELLANEOUS) ×2 IMPLANT
ELECT REM PT RETURN 15FT ADLT (MISCELLANEOUS) ×2 IMPLANT
GAUZE SPONGE 4X4 12PLY STRL (GAUZE/BANDAGES/DRESSINGS) IMPLANT
GLOVE BIOGEL M 8.0 STRL (GLOVE) ×2 IMPLANT
GOWN STRL REUS W/TWL XL LVL3 (GOWN DISPOSABLE) ×8 IMPLANT
GRASPER SUT TROCAR 14GX15 (MISCELLANEOUS) ×2 IMPLANT
HANDLE STAPLE EGIA 4 XL (STAPLE) ×2 IMPLANT
KIT BASIN OR (CUSTOM PROCEDURE TRAY) ×2 IMPLANT
KIT TURNOVER KIT A (KITS) IMPLANT
MARKER SKIN DUAL TIP RULER LAB (MISCELLANEOUS) ×2 IMPLANT
MAT PREVALON FULL STRYKER (MISCELLANEOUS) ×2 IMPLANT
NEEDLE SPNL 22GX3.5 QUINCKE BK (NEEDLE) ×2 IMPLANT
PACK CARDIOVASCULAR III (CUSTOM PROCEDURE TRAY) ×2 IMPLANT
RELOAD TRI 45 ART MED THCK BLK (STAPLE) ×2 IMPLANT
RELOAD TRI 45 ART MED THCK PUR (STAPLE) ×2 IMPLANT
RELOAD TRI 60 ART MED THCK BLK (STAPLE) ×4 IMPLANT
RELOAD TRI 60 ART MED THCK PUR (STAPLE) ×2 IMPLANT
SCISSORS LAP 5X45 EPIX DISP (ENDOMECHANICALS) ×2 IMPLANT
SET IRRIG TUBING LAPAROSCOPIC (IRRIGATION / IRRIGATOR) ×2 IMPLANT
SET TUBE SMOKE EVAC HIGH FLOW (TUBING) ×2 IMPLANT
SHEARS HARMONIC ACE PLUS 45CM (MISCELLANEOUS) ×2 IMPLANT
SLEEVE ADV FIXATION 5X100MM (TROCAR) ×4 IMPLANT
SLEEVE GASTRECTOMY 36FR VISIGI (MISCELLANEOUS) ×2 IMPLANT
SOL ANTI FOG 6CC (MISCELLANEOUS) ×1 IMPLANT
SOLUTION ANTI FOG 6CC (MISCELLANEOUS) ×1
SPONGE LAP 18X18 RF (DISPOSABLE) ×2 IMPLANT
STAPLER VISISTAT 35W (STAPLE) IMPLANT
SUT MNCRL AB 4-0 PS2 18 (SUTURE) ×6 IMPLANT
SUT SURGIDAC NAB ES-9 0 48 120 (SUTURE) ×4 IMPLANT
SUT VICRYL 0 TIES 12 18 (SUTURE) ×2 IMPLANT
SYR 10ML ECCENTRIC (SYRINGE) ×2 IMPLANT
SYR 20ML LL LF (SYRINGE) ×2 IMPLANT
SYR 50ML LL SCALE MARK (SYRINGE) ×2 IMPLANT
TOWEL OR 17X26 10 PK STRL BLUE (TOWEL DISPOSABLE) ×2 IMPLANT
TOWEL OR NON WOVEN STRL DISP B (DISPOSABLE) ×2 IMPLANT
TRAY FOLEY MTR SLVR 16FR STAT (SET/KITS/TRAYS/PACK) IMPLANT
TROCAR ADV FIXATION 5X100MM (TROCAR) ×2 IMPLANT
TROCAR BLADELESS 15MM (ENDOMECHANICALS) ×2 IMPLANT
TROCAR BLADELESS OPT 5 100 (ENDOMECHANICALS) ×2 IMPLANT
TUBE CALIBRATION LAPBAND (TUBING) ×2 IMPLANT
TUBING CONNECTING 10 (TUBING) ×4 IMPLANT
TUBING ENDO SMARTCAP (MISCELLANEOUS) ×2 IMPLANT

## 2021-01-07 NOTE — Transfer of Care (Signed)
Immediate Anesthesia Transfer of Care Note  Patient: Brittney Sanford  Procedure(s) Performed: LAPAROSCOPIC GASTRIC SLEEVE RESECTION AND HIATAL HERNIA REPAIR (N/A ) UPPER GI ENDOSCOPY (N/A )  Patient Location: PACU  Anesthesia Type:General  Level of Consciousness: awake, alert , oriented and patient cooperative  Airway & Oxygen Therapy: Patient Spontanous Breathing and Patient connected to face mask oxygen  Post-op Assessment: Report given to RN, Post -op Vital signs reviewed and stable and Patient moving all extremities  Post vital signs: Reviewed and stable  Last Vitals:  Vitals Value Taken Time  BP 132/74 01/07/21 1144  Temp    Pulse 77 01/07/21 1145  Resp 21 01/07/21 1145  SpO2 98 % 01/07/21 1145  Vitals shown include unvalidated device data.  Last Pain:  Vitals:   01/07/21 0802  TempSrc: Oral  PainSc:       Patients Stated Pain Goal: 3 (01/07/21 0801)  Complications: No complications documented.

## 2021-01-07 NOTE — Op Note (Signed)
   Patient: Brittney Sanford ([birth year], [id number])  Date of Surgery: 01/07/2021   Preoperative Diagnosis: MORBID OBESITY   Postoperative Diagnosis: MORBID OBESITY   Surgical Procedure: Upper Endoscopy   Surgeon: Ivar Drape, MD  Anesthesiologist: Lewie Loron, MD CRNA: Elisabeth Cara, CRNA; Ezekiel Ina, CRNA   Anesthesia: General   Fluids:  Total I/O In: 100 [IV Piggyback:100] Out: -   Complications: None  Drains:  None  Specimen: None   Indications for Procedure: [name] is a 55 y.o. female undergoing sleeve gastrectomy and an EGD was requested to evaluate foregut anatomy intraoperatively.  Description of Procedure: During the procedure, I scrubbed out and obtained the Olympus endoscope. I gently placed endoscope in the patient's oropharynx and gently glided it down the esophagus without any difficulty under direct visualization.  The scope was advanced as far as the pylorus and then slowly withdrawn to inspect the foregut anatomy.  Dr. Daphine Deutscher had placed saline in the upper abdomen and all staple lines were submerged to ensure no air leak. There was no evidence of bubbles. There was no evidence of intraluminal bleeding and the mucosa appeared healthy.  The lumen was widely patent without evidence of stricture.  The GE junction was located at 39 cm.  The intraluminal insufflation was decompressed. The scope was withdrawn. The patient tolerated this portion of the procedure well. Please see Dr Ermalene Searing operative note for details regarding the remainder of the procedure.    Ivar Drape, MD General, Bariatric, & Minimally Invasive Surgery [facility], [place]

## 2021-01-07 NOTE — Op Note (Addendum)
01/07/2021  Surgeon: Wenda Low, MD, FACS  Asst:  Ivar Drape, MD  Anes:  General endotracheal  Procedure: Laparoscopic repair of hiatal hernia (1 suture) and  sleeve gastrectomy and upper endoscopy  Diagnosis: Morbid obesity  Complications: none  EBL:   20 cc  Description of Procedure:  The patient was take to OR 1 and given general anesthesia.  The abdomen was prepped with Technicare and draped sterilely.  A timeout was performed.  Access to the abdomen was achieved with a 5 mm Optiview through the left upper quadrant.  Adhesions to the anterior abdominal wall were taken down.  .  Following insufflation, the state of the abdomen was found to be with the adhesions described above.  A visible dimple was noted and the balloon test was positive.  A posterior dissection was performed and a single suture was placed and the balloon test with 10 cc of air was repeated and was negative.   The ViSiGi 36Fr tube was inserted to deflate the stomach and was pulled back into the esophagus.    The pylorus was identified and we measured 5  cm back and marked the antrum.  At that point we began dissection to take down the greater curvature of the stomach using the Harmonic scalpel.  This dissection was taken all the way up to the left crus.  Posterior attachments of the stomach were also taken down.  The spleen was closely adherent to the stomach*.    The ViSiGi tube was then passed into the antrum and suction applied so that it was snug along the lessor curvature.  The "crow's foot" or incisura was identified.  The sleeve gastrectomy was begun using the [employer] stapler beginning with a 4.5 cm black load with TRS followed by a 6 cm black load with TRS and then 6 cm purple loads until the very end when a 4.5 cm was needed to complete.  When the sleeve was complete the tube was taken off suction and insufflated briefly.  The tube was withdrawn.  Upper endoscopy was then performed by Dr.  Dossie Der. .     The specimen was extracted through the 15 trocar site.  Local was provided by infiltrating with TAP block with Exparel and closed 4-0 Monocryl and Dermabond.    Matt B. Daphine Deutscher, MD, [facility], [place] [phone]

## 2021-01-07 NOTE — Progress Notes (Signed)
PHARMACY CONSULT FOR:  Risk Assessment for Post-Discharge VTE Following Bariatric Surgery  Post-Discharge VTE Risk Assessment: This patient's probability of 30-day post-discharge VTE is increased due to the factors marked:   Female    Age >/=60 years    BMI >/=50 kg/m2    CHF    Dyspnea at Rest    Paraplegia   X Non-gastric-band surgery    Operation Time >/=3 hr    Return to OR     Length of Stay >/= 3 d   Hx of VTE   Hypercoagulable condition   Significant venous stasis       Predicted probability of 30-day post-discharge VTE: 0.16%  Other patient-specific factors to consider: N/A   Recommendation for Discharge: No pharmacologic prophylaxis post-discharge       Brittney Sanford is a 55 y.o. female who underwent laparoscopic repair of hiatal hernia, sleeve gastrectomy, and upper endoscopy on 01/07/2021.    Case start: 1005 Case end: 1130   Allergies  Allergen Reactions  . Lithium Shortness Of Breath    Difficulty breathing  . Aspirin Rash    Patient Measurements: Height: 5' 4" (162.6 cm) Weight: 116.8 kg (257 lb 6.4 oz) IBW/kg (Calculated) : 54.7 Body mass index is 44.18 kg/m.  Recent Labs    01/07/21 0830  WBC 9.0  HGB 14.2  HCT 43.0  PLT 207  CREATININE 0.85  ALBUMIN 4.3  PROT 7.8  AST 32  ALT 30  ALKPHOS 78  BILITOT 0.9   Estimated Creatinine Clearance: 95 mL/min (by C-G formula based on SCr of 0.85 mg/dL).    Past Medical History:  Diagnosis Date  . Anemia   . Anxiety   . Asthma   . Chronic back pain   . COPD (chronic obstructive pulmonary disease) ([place])   . Depression   . Fibromyalgia   . GERD (gastroesophageal reflux disease)   . Headache    MIGRAINES  . Hyperlipidemia   . 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 Rheumatoid arthritis ([place])   . Sleep apnea    CPAP      Medications Prior to Admission  Medication Sig Dispense Refill Last Dose  . ALPRAZolam (XANAX) 0.5 MG tablet Take 0.5 mg by mouth 2 (two) times  daily as needed for anxiety.   01/07/2021 at 0400  . Calcium Citrate-Vitamin D (CELEBRATE CALCIUM PLUS 500) 500-333 MG-UNIT CHEW Chew 1 tablet by mouth in the morning, at noon, in the evening, and at bedtime.   01/06/2021 at Unknown time  . cyclobenzaprine (FLEXERIL) 10 MG tablet Take 10 mg by mouth 3 (three) times daily.   Past Month at Unknown time  . DEXILANT 60 MG capsule Take 60 mg by mouth daily.   01/07/2021 at 0400  . diclofenac Sodium (VOLTAREN) 1 % GEL Apply 1 application topically 4 (four) times daily as needed for pain.   Past Month at Unknown time  . gabapentin (NEURONTIN) 300 MG capsule Take 300 mg by mouth 3 (three) times daily.   Past Month at Unknown time  . LATUDA 40 MG TABS tablet Take 40 mg by mouth daily.   Past Month at Unknown time  . LINZESS 290 MCG CAPS capsule Take 290 mcg by mouth daily.   01/07/2021 at 0400  . meloxicam (MOBIC) 7.5 MG tablet Take 7.5 mg by mouth 2 (two) times daily.   Past Week at Unknown time  . Multiple Vitamins-Minerals (BARIATRIC MULTIVITAMINS/IRON PO) Take 1 tablet by mouth  daily. W/45 mg IRON   01/06/2021 at Unknown time  . NARCAN 4 MG/0.1ML LIQD nasal spray kit Place 1 spray into the nose as needed (accidnetal overdose).     Marland Kitchen oxyCODONE-acetaminophen (PERCOCET) 10-325 MG tablet Take 1 tablet by mouth in the morning, at noon, and at bedtime.   01/06/2021 at Unknown time  . SUMAtriptan (IMITREX) 100 MG tablet Take 100 mg by mouth every 2 (two) hours as needed for migraine.   Past Month at Unknown time  . albuterol (PROVENTIL) (2.5 MG/3ML) 0.083% nebulizer solution Take 2.5 mg by nebulization 4 (four) times daily as needed for wheezing or shortness of breath.   More than a month at Unknown time  . albuterol (VENTOLIN HFA) 108 (90 Base) MCG/ACT inhaler Inhale 2 puffs into the lungs every 6 (six) hours as needed for wheezing or shortness of breath.   More than a month at Unknown time  . Fluticasone-Salmeterol (ADVAIR) 500-50 MCG/DOSE AEPB Inhale 1 puff into the lungs  2 (two) times daily.   More than a month at Unknown time       Luiz Ochoa 01/07/2021,12:57 PM

## 2021-01-07 NOTE — Anesthesia Postprocedure Evaluation (Signed)
Anesthesia Post Note  Patient: Brittney Sanford  Procedure(s) Performed: LAPAROSCOPIC GASTRIC SLEEVE RESECTION AND HIATAL HERNIA REPAIR (N/A ) UPPER GI ENDOSCOPY (N/A )     Patient location during evaluation: PACU Anesthesia Type: General Level of consciousness: sedated and patient cooperative Pain management: pain level controlled Vital Signs Assessment: post-procedure vital signs reviewed and stable Respiratory status: spontaneous breathing Cardiovascular status: stable Anesthetic complications: no   No complications documented.  Last Vitals:  Vitals:   01/07/21 1315 01/07/21 1420  BP: 126/81 103/71  Pulse: 78 63  Resp: 18 15  Temp: 36.6 C 36.7 C  SpO2: 96% 90%    Last Pain:  Vitals:   01/07/21 1315  TempSrc: Oral  PainSc:                  Lewie Loron

## 2021-01-07 NOTE — Anesthesia Procedure Notes (Signed)
Procedure Name: Intubation Date/Time: 01/07/2021 9:42 AM Performed by: Victoriano Lain, CRNA Pre-anesthesia Checklist: Patient identified, Emergency Drugs available, Suction available, Patient being monitored and Timeout performed Patient Re-evaluated:Patient Re-evaluated prior to induction Oxygen Delivery Method: Circle system utilized Preoxygenation: Pre-oxygenation with 100% oxygen Induction Type: IV induction Ventilation: Mask ventilation without difficulty Laryngoscope Size: Mac and 4 Grade View: Grade I Tube type: Oral Tube size: 7.5 mm Number of attempts: 1 Airway Equipment and Method: Stylet Placement Confirmation: ETT inserted through vocal cords under direct vision,  positive ETCO2 and breath sounds checked- equal and bilateral Secured at: 21 cm Tube secured with: Tape Dental Injury: Teeth and Oropharynx as per pre-operative assessment

## 2021-01-07 NOTE — Progress Notes (Signed)
Discussed post op day goals with patient including ambulation, IS, diet progression, pain, and nausea control.  BSTOP education provided including BSTOP information guide, "Guide for Pain Management after your Bariatric Procedure".  Questions answered. 

## 2021-01-07 NOTE — Interval H&P Note (Signed)
History and Physical Interval Note:  01/07/2021 9:00 AM  Brittney Sanford  has presented today for surgery, with the diagnosis of MORBID OBESITY.  The various methods of treatment have been discussed with the patient and family. After consideration of risks, benefits and other options for treatment, the patient has consented to  Procedure(s): LAPAROSCOPIC GASTRIC SLEEVE RESECTION (N/A) UPPER GI ENDOSCOPY (N/A) as a surgical intervention.  The patient's history has been reviewed, patient examined, no change in status, stable for surgery.  I have reviewed the patient's chart and labs.  Questions were answered to the patient's satisfaction.     Valarie Merino

## 2021-01-07 NOTE — Anesthesia Preprocedure Evaluation (Addendum)
Anesthesia Evaluation  Patient identified by MRN, date of birth, ID band Patient awake    Reviewed: Allergy & Precautions, NPO status , Patient's Chart, lab work & pertinent test results  Airway Mallampati: I  TM Distance: >3 FB Neck ROM: Full    Dental  (+) Teeth Intact, Dental Advisory Given   Pulmonary asthma , sleep apnea and Continuous Positive Airway Pressure Ventilation , COPD, Current Smoker and Patient abstained from smoking.,  Still smoking 1/2ppd Was prescribed advair and duonebs but does not use any inhalers Has not used CPAP in about a month. Per pt, nasal mask gets in the way of her thumb sucking at night.    Pulmonary exam normal breath sounds clear to auscultation       Cardiovascular negative cardio ROS Normal cardiovascular exam Rhythm:Regular Rate:Normal     Neuro/Psych  Headaches, PSYCHIATRIC DISORDERS Anxiety Depression  Neuromuscular disease    GI/Hepatic Neg liver ROS, GERD  Controlled and Medicated,  Endo/Other  Morbid obesityBMI 45  Renal/GU negative Renal ROS  negative genitourinary   Musculoskeletal  (+) Arthritis , Rheumatoid disorders,  Fibromyalgia -, narcotic dependentChronic LBP Chronic perocet 10/325mg  TID for many years Primary pain sites: back, wrists and knees   Abdominal (+) + obese,   Peds  Hematology  (+) Blood dyscrasia, anemia , hct 43.2   Anesthesia Other Findings   Reproductive/Obstetrics negative OB ROS S/p hysterectomy                            Anesthesia Physical Anesthesia Plan  ASA: III  Anesthesia Plan: General   Post-op Pain Management:    Induction: Intravenous  PONV Risk Score and Plan: 4 or greater and Ondansetron, Dexamethasone, Midazolam, Treatment may vary due to age or medical condition and Scopolamine patch - Pre-op  Airway Management Planned: Oral ETT  Additional Equipment: None  Intra-op Plan:   Post-operative Plan:  Extubation in OR  Informed Consent: I have reviewed the patients History and Physical, chart, labs and discussed the procedure including the risks, benefits and alternatives for the proposed anesthesia with the patient or authorized representative who has indicated his/her understanding and acceptance.     Dental advisory given  Plan Discussed with: CRNA  Anesthesia Plan Comments:        Anesthesia Quick Evaluation

## 2021-01-08 ENCOUNTER — Encounter (HOSPITAL_COMMUNITY): Payer: Self-pay | Admitting: Surgery

## 2021-01-08 LAB — CBC WITH DIFFERENTIAL/PLATELET
Abs Immature Granulocytes: 0.03 10*3/uL (ref 0.00–0.07)
Basophils Absolute: 0 10*3/uL (ref 0.0–0.1)
Basophils Relative: 0 %
Eosinophils Absolute: 0 10*3/uL (ref 0.0–0.5)
Eosinophils Relative: 0 %
HCT: 42 % (ref 36.0–46.0)
Hemoglobin: 13.8 g/dL (ref 12.0–15.0)
Immature Granulocytes: 0 %
Lymphocytes Relative: 9 %
Lymphs Abs: 1.2 10*3/uL (ref 0.7–4.0)
MCH: 29.7 pg (ref 26.0–34.0)
MCHC: 32.9 g/dL (ref 30.0–36.0)
MCV: 90.3 fL (ref 80.0–100.0)
Monocytes Absolute: 0.7 10*3/uL (ref 0.1–1.0)
Monocytes Relative: 5 %
Neutro Abs: 12.1 10*3/uL — ABNORMAL HIGH (ref 1.7–7.7)
Neutrophils Relative %: 86 %
Platelets: 210 10*3/uL (ref 150–400)
RBC: 4.65 MIL/uL (ref 3.87–5.11)
RDW: 15.1 % (ref 11.5–15.5)
WBC: 14 10*3/uL — ABNORMAL HIGH (ref 4.0–10.5)
nRBC: 0 % (ref 0.0–0.2)

## 2021-01-08 LAB — SURGICAL PATHOLOGY

## 2021-01-08 MED ORDER — PROCHLORPERAZINE EDISYLATE 10 MG/2ML IJ SOLN
10.0000 mg | Freq: Four times a day (QID) | INTRAMUSCULAR | Status: DC | PRN
  Administered 2021-01-08: 10 mg via INTRAVENOUS
  Filled 2021-01-08: qty 2

## 2021-01-08 MED ORDER — PROCHLORPERAZINE EDISYLATE 10 MG/2ML IJ SOLN
10.0000 mg | Freq: Once | INTRAMUSCULAR | Status: AC
  Administered 2021-01-08: 10 mg via INTRAVENOUS
  Filled 2021-01-08: qty 2

## 2021-01-08 MED ORDER — PROMETHAZINE HCL 25 MG/ML IJ SOLN: 12.5000 mg | Freq: Four times a day (QID) | INTRAMUSCULAR | Status: DC | PRN

## 2021-01-08 NOTE — Progress Notes (Signed)
Patient ID: Brittney Sanford, female   DOB: Apr 12, 1966, 55 y.o.   MRN: [id number] [facility] Progress Note:   1 Day Post-Op  Subjective: Mental status is clear.  Complaints nausea . Objective: Vital signs in last 24 hours: Temp:  [97.4 F (36.3 C)-99.2 F (37.3 C)] 99.1 F (37.3 C) (02/08 1319) Pulse Rate:  [60-74] 74 (02/08 1319) Resp:  [16-18] 17 (02/08 1319) BP: (109-170)/(73-100) 154/93 (02/08 1319) SpO2:  [91 %-97 %] 94 % (02/08 1319)  Intake/Output from previous day: 02/07 0701 - 02/08 0700 In: 1999.8 [P.O.:30; I.V.:1869.8; IV Piggyback:100] Out: 2320 [Urine:2300; Blood:20] Intake/Output this shift: Total I/O In: 240 [P.O.:40; I.V.:200] Out: 1025 [Urine:1025]  Physical Exam: Work of breathing is normal;  Nausea with dry heaves at times when up.  Incisions oK  Lab Results:  Results for orders placed or performed during the hospital encounter of 01/07/21 (from the past 48 hour(s))  CBC WITH DIFFERENTIAL     Status: None   Collection Time: 01/07/21  8:30 AM  Result Value Ref Range   WBC 9.0 4.0 - 10.5 K/uL   RBC 4.76 3.87 - 5.11 MIL/uL   Hemoglobin 14.2 12.0 - 15.0 g/dL   HCT 23.5 36.1 - 44.3 %   MCV 90.3 80.0 - 100.0 fL   MCH 29.8 26.0 - 34.0 pg   MCHC 33.0 30.0 - 36.0 g/dL   RDW 15.4 00.8 - 67.6 %   Platelets 207 150 - 400 K/uL   nRBC 0.0 0.0 - 0.2 %   Neutrophils Relative % 65 %   Neutro Abs 5.8 1.7 - 7.7 K/uL   Lymphocytes Relative 28 %   Lymphs Abs 2.5 0.7 - 4.0 K/uL   Monocytes Relative 6 %   Monocytes Absolute 0.5 0.1 - 1.0 K/uL   Eosinophils Relative 1 %   Eosinophils Absolute 0.1 0.0 - 0.5 K/uL   Basophils Relative 0 %   Basophils Absolute 0.0 0.0 - 0.1 K/uL   Immature Granulocytes 0 %   Abs Immature Granulocytes 0.01 0.00 - 0.07 K/uL    Comment: Performed at [facility], [address] [place], [place] [address]  Type and screen [facility]     Status: None   Collection Time: 01/07/21  8:30 AM   Result Value Ref Range   ABO/RH(D) O POS    Antibody Screen NEG    Sample Expiration      01/10/2021,2359 Performed at [facility], [address], [place], [place] [address]   Comprehensive metabolic panel     Status: Abnormal   Collection Time: 01/07/21  8:30 AM  Result Value Ref Range   Sodium 136 135 - 145 mmol/L   Potassium 4.2 3.5 - 5.1 mmol/L   Chloride 104 98 - 111 mmol/L   CO2 20 (L) 22 - 32 mmol/L   Glucose, Bld 96 70 - 99 mg/dL    Comment: Glucose reference range applies only to samples taken after fasting for at least 8 hours.   BUN 11 6 - 20 mg/dL   Creatinine, Ser 2.45 0.44 - 1.00 mg/dL   Calcium 9.8 8.9 - 80.9 mg/dL   Total Protein 7.8 6.5 - 8.1 g/dL   Albumin 4.3 3.5 - 5.0 g/dL   AST 32 15 - 41 U/L   ALT 30 0 - 44 U/L   Alkaline Phosphatase 78 38 - 126 U/L   Total Bilirubin 0.9 0.3 - 1.2 mg/dL   GFR, Estimated >98 >33 mL/min  Comment: (NOTE) Calculated using the CKD-EPI Creatinine Equation (2021)    Anion gap 12 5 - 15    Comment: Performed at [facility], [address], [place], [place] [address]  ABO/Rh     Status: None   Collection Time: 01/07/21  9:17 AM  Result Value Ref Range   ABO/RH(D)      O POS Performed at [facility], [address] [place], [place] [address]   Surgical pathology     Status: None   Collection Time: 01/07/21 10:13 AM  Result Value Ref Range   SURGICAL PATHOLOGY      SURGICAL PATHOLOGY CASE: [id number] PATIENT: Brittney Sanford Surgical Pathology Report     Clinical History: Morbid obesity (crm)     FINAL MICROSCOPIC DIAGNOSIS:  A. STOMACH, SLEEVE RESECTION: -Gross diagnosis only: Portion of unremarkable stomach.  GROSS DESCRIPTION:  Received fresh is an 18.6 x 3.8 x 2.7 cm partial gastrectomy.  The margin is stapled.  The serosa is smooth and tan.  The mucosa is glistening, tan with preservation of the rugal folds.  There are no discrete  masses or areas of ulceration.  No sections are submitted. [facility] 01/07/2021)   Final Diagnosis performed by Valinda Hoar, MD.   Electronically signed 01/08/2021 Technical and / or Professional components performed at [facility], [address], [place], [place] [address].  Immunohistochemistry Technical component (if applicable) was performed at [facility]. [address], [place], [place] [address].   IMMUNOHISTOCHEMISTRY DISCLAIMER (i f applicable): Some of these immunohistochemical stains may have been developed and the performance characteristics determine by [facility]. Some may not have been cleared or approved by the U.S. Food and Drug Administration. The FDA has determined that such clearance or approval is not necessary. This test is used for clinical purposes. It should not be regarded as investigational or for research. This laboratory is certified under the Clinical Laboratory Improvement Amendments of 1988 (CLIA-88) as qualified to perform high complexity clinical laboratory testing.  The controls stained appropriately.   Hemoglobin and hematocrit, blood     Status: None   Collection Time: 01/07/21  3:52 PM  Result Value Ref Range   Hemoglobin 12.2 12.0 - 15.0 g/dL   HCT 29.5 62.1 - 30.8 %    Comment: Performed at [facility], [address] [place], [place] [address]  CBC WITH DIFFERENTIAL     Status: Abnormal   Collection Time: 01/08/21  4:50 AM  Result Value Ref Range   WBC 14.0 (H) 4.0 - 10.5 K/uL   RBC 4.65 3.87 - 5.11 MIL/uL   Hemoglobin 13.8 12.0 - 15.0 g/dL   HCT 69.6 29.5 - 28.4 %   MCV 90.3 80.0 - 100.0 fL   MCH 29.7 26.0 - 34.0 pg   MCHC 32.9 30.0 - 36.0 g/dL   RDW 13.2 44.0 - 10.2 %   Platelets 210 150 - 400 K/uL   nRBC 0.0 0.0 - 0.2 %   Neutrophils Relative % 86 %   Neutro Abs 12.1 (H) 1.7 - 7.7 K/uL   Lymphocytes Relative 9 %   Lymphs Abs 1.2 0.7 - 4.0 K/uL   Monocytes  Relative 5 %   Monocytes Absolute 0.7 0.1 - 1.0 K/uL   Eosinophils Relative 0 %   Eosinophils Absolute 0.0 0.0 - 0.5 K/uL   Basophils Relative 0 %   Basophils Absolute 0.0 0.0 - 0.1 K/uL   Immature Granulocytes  0 %   Abs Immature Granulocytes 0.03 0.00 - 0.07 K/uL    Comment: Performed at [facility], [address] [place], [place] [address]    Radiology/Results: No results found.  Anti-infectives: Anti-infectives (From admission, onward)   Start     Dose/Rate Route Frequency Ordered Stop   01/07/21 0800  cefoTEtan (CEFOTAN) 2 g in sodium chloride 0.9 % 100 mL IVPB        2 g 200 mL/hr over 30 Minutes Intravenous On call to O.R. 01/07/21 0748 01/07/21 1013      Assessment/Plan: Problem List: Patient Active Problem List   Diagnosis Date Noted  . 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 Obesity 01/10/2020    Not drinking enough  Continue observation.   1 Day Post-Op    LOS: 1 day   Matt B. Daphine Deutscher, MD, [facility] [phone] to reach the surgeon on call.    01/08/2021 2:40 PM

## 2021-01-08 NOTE — Progress Notes (Signed)
Patient alert and oriented, Post op day 1.  Provided support and encouragement.  Encouraged pulmonary toilet, ambulation and small sips of liquids.  Patient not tolerating PO intake well, has not started protein.  MD aware.  All questions answered.  Will continue to monitor.

## 2021-01-08 NOTE — Progress Notes (Signed)
Pt not tolerating PO fluids. Zofran not controlling nausea and vomiting. Gerkin, MD notified. Compazine ordered.

## 2021-01-09 LAB — CBC WITH DIFFERENTIAL/PLATELET
Abs Immature Granulocytes: 0.05 10*3/uL (ref 0.00–0.07)
Basophils Absolute: 0 10*3/uL (ref 0.0–0.1)
Basophils Relative: 0 %
Eosinophils Absolute: 0.1 10*3/uL (ref 0.0–0.5)
Eosinophils Relative: 1 %
HCT: 42.1 % (ref 36.0–46.0)
Hemoglobin: 14 g/dL (ref 12.0–15.0)
Immature Granulocytes: 0 %
Lymphocytes Relative: 17 %
Lymphs Abs: 2.4 10*3/uL (ref 0.7–4.0)
MCH: 29.7 pg (ref 26.0–34.0)
MCHC: 33.3 g/dL (ref 30.0–36.0)
MCV: 89.4 fL (ref 80.0–100.0)
Monocytes Absolute: 0.9 10*3/uL (ref 0.1–1.0)
Monocytes Relative: 7 %
Neutro Abs: 10.8 10*3/uL — ABNORMAL HIGH (ref 1.7–7.7)
Neutrophils Relative %: 75 %
Platelets: 209 10*3/uL (ref 150–400)
RBC: 4.71 MIL/uL (ref 3.87–5.11)
RDW: 15.4 % (ref 11.5–15.5)
WBC: 14.3 10*3/uL — ABNORMAL HIGH (ref 4.0–10.5)
nRBC: 0 % (ref 0.0–0.2)

## 2021-01-09 NOTE — Progress Notes (Signed)
Tried Unjury soup for patient's protein intake, she was not able to tolerate without emesis. Advised patient to try regular broth to see if she can tolerate better. Will continue to monitor.

## 2021-01-09 NOTE — Progress Notes (Signed)
Patient alert and oriented, Post op day 2.  Provided support and encouragement.  Encouraged pulmonary toilet, ambulation and small sips of liquids.  Pt continues to try PO intake.  Pt still has not started protein.  MD aware.  All questions answered.  Will continue to monitor.

## 2021-01-09 NOTE — Progress Notes (Signed)
Patient ID: Brittney Sanford, female   DOB: 04-May-1966, 55 y.o.   MRN: [id number] [facility] Surgery Progress Note:   2 Days Post-Op  Subjective: Mental status is clear.  Complaints nausea is better. Objective: Vital signs in last 24 hours: Temp:  [97.8 F (36.6 C)-99.9 F (37.7 C)] 97.8 F (36.6 C) (02/09 1328) Pulse Rate:  [63-91] 77 (02/09 1328) Resp:  [16-18] 18 (02/09 1328) BP: (142-171)/(67-124) 160/67 (02/09 1400) SpO2:  [93 %-98 %] 98 % (02/09 1328)  Intake/Output from previous day: 02/08 0701 - 02/09 0700 In: 2089.4 [P.O.:40; I.V.:2049.4] Out: 3350 [Urine:3350] Intake/Output this shift: Total I/O In: 915 [P.O.:240; I.V.:675] Out: 500 [Urine:500]  Physical Exam: Work of breathing is normal.  nontender  Lab Results:  Results for orders placed or performed during the hospital encounter of 01/07/21 (from the past 48 hour(s))  CBC WITH DIFFERENTIAL     Status: Abnormal   Collection Time: 01/08/21  4:50 AM  Result Value Ref Range   WBC 14.0 (H) 4.0 - 10.5 K/uL   RBC 4.65 3.87 - 5.11 MIL/uL   Hemoglobin 13.8 12.0 - 15.0 g/dL   HCT 99.2 42.6 - 83.4 %   MCV 90.3 80.0 - 100.0 fL   MCH 29.7 26.0 - 34.0 pg   MCHC 32.9 30.0 - 36.0 g/dL   RDW 19.6 22.2 - 97.9 %   Platelets 210 150 - 400 K/uL   nRBC 0.0 0.0 - 0.2 %   Neutrophils Relative % 86 %   Neutro Abs 12.1 (H) 1.7 - 7.7 K/uL   Lymphocytes Relative 9 %   Lymphs Abs 1.2 0.7 - 4.0 K/uL   Monocytes Relative 5 %   Monocytes Absolute 0.7 0.1 - 1.0 K/uL   Eosinophils Relative 0 %   Eosinophils Absolute 0.0 0.0 - 0.5 K/uL   Basophils Relative 0 %   Basophils Absolute 0.0 0.0 - 0.1 K/uL   Immature Granulocytes 0 %   Abs Immature Granulocytes 0.03 0.00 - 0.07 K/uL    Comment: Performed at [facility], [address], [place], [place] [address]  CBC with Differential     Status: Abnormal   Collection Time: 01/09/21  5:08 AM  Result Value Ref Range   WBC 14.3 (H) 4.0 - 10.5 K/uL   RBC 4.71 3.87 -  5.11 MIL/uL   Hemoglobin 14.0 12.0 - 15.0 g/dL   HCT 94.1 74.0 - 81.4 %   MCV 89.4 80.0 - 100.0 fL   MCH 29.7 26.0 - 34.0 pg   MCHC 33.3 30.0 - 36.0 g/dL   RDW 48.1 85.6 - 31.4 %   Platelets 209 150 - 400 K/uL   nRBC 0.0 0.0 - 0.2 %   Neutrophils Relative % 75 %   Neutro Abs 10.8 (H) 1.7 - 7.7 K/uL   Lymphocytes Relative 17 %   Lymphs Abs 2.4 0.7 - 4.0 K/uL   Monocytes Relative 7 %   Monocytes Absolute 0.9 0.1 - 1.0 K/uL   Eosinophils Relative 1 %   Eosinophils Absolute 0.1 0.0 - 0.5 K/uL   Basophils Relative 0 %   Basophils Absolute 0.0 0.0 - 0.1 K/uL   Immature Granulocytes 0 %   Abs Immature Granulocytes 0.05 0.00 - 0.07 K/uL    Comment: Performed at [facility], [address], [place], [place] [address]    Radiology/Results: No results found.  Anti-infectives: Anti-infectives (From admission, onward)   Start     Dose/Rate Route Frequency Ordered Stop  01/07/21 0800  cefoTEtan (CEFOTAN) 2 g in sodium chloride 0.9 % 100 mL IVPB        2 g 200 mL/hr over 30 Minutes Intravenous On call to O.R. 01/07/21 0748 01/07/21 1013      Assessment/Plan: Problem List: Patient Active Problem List   Diagnosis Date Noted  . 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 Obesity 01/10/2020    Today she is tolerating warm soup but hasn't taken protein shake yet.  Will try to expand PO intake and reassess for discharge tomorrow.   2 Days Post-Op    LOS: 2 days   Matt B. Daphine Deutscher, MD, [facility] [phone] to reach the surgeon on call.    01/09/2021 5:00 PM

## 2021-01-09 NOTE — Progress Notes (Signed)
Patient alert and oriented, pain is controlled. Patient is still not able to tolerate fluids without nausea, but is improving. RN will attempt to advance to protein shake later tonight.  Reviewed Gastric sleeve discharge instructions with patient and patient is able to articulate understanding.  Provided information on BELT program, Support Group. All questions answered.  RN will provide AVS and update any medication changes with patient tomorrow during D/C.  Will follow up with patient after D/C.

## 2021-01-09 NOTE — Progress Notes (Signed)
Nutrition Education Note  Received consult for diet education for patient s/p bariatric surgery.  Discussed 2 week post op diet with pt. Emphasized that liquids must be non carbonated, non caffeinated, and sugar free. Fluid goals discussed. Pt to follow up with outpatient bariatric RD for further diet progression after 2 weeks. Multivitamins and minerals also reviewed. Teach back method used, pt expressed understanding, expect good compliance.  If nutrition issues arise, please consult RD.  Lars Masson, RD, LDN Clinical Nutrition After Hours/Weekend Pager # in Amion

## 2021-01-10 ENCOUNTER — Other Ambulatory Visit (HOSPITAL_COMMUNITY): Payer: Self-pay | Admitting: Surgery

## 2021-01-10 LAB — CBC WITH DIFFERENTIAL/PLATELET
Abs Immature Granulocytes: 0.04 10*3/uL (ref 0.00–0.07)
Basophils Absolute: 0 10*3/uL (ref 0.0–0.1)
Basophils Relative: 0 %
Eosinophils Absolute: 0.4 10*3/uL (ref 0.0–0.5)
Eosinophils Relative: 3 %
HCT: 41.2 % (ref 36.0–46.0)
Hemoglobin: 13.8 g/dL (ref 12.0–15.0)
Immature Granulocytes: 0 %
Lymphocytes Relative: 23 %
Lymphs Abs: 2.6 10*3/uL (ref 0.7–4.0)
MCH: 30 pg (ref 26.0–34.0)
MCHC: 33.5 g/dL (ref 30.0–36.0)
MCV: 89.6 fL (ref 80.0–100.0)
Monocytes Absolute: 0.9 10*3/uL (ref 0.1–1.0)
Monocytes Relative: 8 %
Neutro Abs: 7.2 10*3/uL (ref 1.7–7.7)
Neutrophils Relative %: 66 %
Platelets: 185 10*3/uL (ref 150–400)
RBC: 4.6 MIL/uL (ref 3.87–5.11)
RDW: 15.4 % (ref 11.5–15.5)
WBC: 11.2 10*3/uL — ABNORMAL HIGH (ref 4.0–10.5)
nRBC: 0 % (ref 0.0–0.2)

## 2021-01-10 LAB — BASIC METABOLIC PANEL
Anion gap: 7 (ref 5–15)
BUN: 13 mg/dL (ref 6–20)
CO2: 29 mmol/L (ref 22–32)
Calcium: 10.5 mg/dL — ABNORMAL HIGH (ref 8.9–10.3)
Chloride: 103 mmol/L (ref 98–111)
Creatinine, Ser: 1.08 mg/dL — ABNORMAL HIGH (ref 0.44–1.00)
GFR, Estimated: >60 mL/min (ref >60)
Glucose, Bld: 107 mg/dL — ABNORMAL HIGH (ref 70–99)
Potassium: 4 mmol/L (ref 3.5–5.1)
Sodium: 139 mmol/L (ref 135–145)

## 2021-01-10 MED ORDER — OXYCODONE HCL 5 MG PO TABS: 5.0000 mg | ORAL_TABLET | Freq: Four times a day (QID) | ORAL | 0 refills | Status: DC | PRN

## 2021-01-10 MED ORDER — ONDANSETRON 4 MG PO TBDP: 4.0000 mg | ORAL_TABLET | Freq: Four times a day (QID) | ORAL | 0 refills | Status: DC | PRN

## 2021-01-10 MED FILL — ONDANSETRON ODT 4 MG TABLET: 4 | 7 days supply | Qty: 20 | Fill #0

## 2021-01-10 MED FILL — oxyCODONE HCL 5 MG TABS: 5 | 5 days supply | Qty: 20 | Fill #0

## 2021-01-10 NOTE — Discharge Summary (Signed)
Physician Discharge Summary  Patient ID: Brittney Sanford MRN: [id number] DOB/AGE: 1966/01/25 55 y.o.  PCP: Guadlupe Spanish, MD  Admit date: 01/07/2021 Discharge date: 01/10/2021  Admission Diagnoses:  morbid obesity  Discharge Diagnoses:  same with hatal hernia  Active Problems:   S/P laparoscopic sleeve gastrectomy   Surgery:  lap repair of hiatal hernia and sleeve gastrectomy and endoscopy  Discharged Condition: improved  Hospital Course:   had surgery on Monday and had nausea and dry heaves that delayed her discharge until Thursday.    Consults: none  Significant Diagnostic Studies: none    Discharge Exam: Blood pressure (!) 146/98, pulse 69, temperature 98.1 F (36.7 C), temperature source Oral, resp. rate 18, height 5' 4"  (1.626 m), weight 116.8 kg, SpO2 92 %. Incisions ok  Disposition:      Allergies as of 01/10/2021       Reactions   Lithium Shortness Of Breath   Difficulty breathing   Aspirin Rash        Medication List     STOP taking these medications    meloxicam 7.5 MG tablet Commonly known as: MOBIC   Narcan 4 MG/0.1ML Liqd nasal spray kit Generic drug: naloxone   oxyCODONE-acetaminophen 10-325 MG tablet Commonly known as: PERCOCET       TAKE these medications    albuterol 108 (90 Base) MCG/ACT inhaler Commonly known as: VENTOLIN HFA Inhale 2 puffs into the lungs every 6 (six) hours as needed for wheezing or shortness of breath.   albuterol (2.5 MG/3ML) 0.083% nebulizer solution Commonly known as: PROVENTIL Take 2.5 mg by nebulization 4 (four) times daily as needed for wheezing or shortness of breath.   ALPRAZolam 0.5 MG tablet Commonly known as: XANAX Take 0.5 mg by mouth 2 (two) times daily as needed for anxiety.   BARIATRIC MULTIVITAMINS/IRON PO Take 1 tablet by mouth daily. W/45 mg IRON   Celebrate Calcium Plus 500 500-333 MG-UNIT Chew Generic drug: Calcium Citrate-Vitamin D Chew 1 tablet by mouth in the morning, at noon,  in the evening, and at bedtime.   cyclobenzaprine 10 MG tablet Commonly known as: FLEXERIL Take 10 mg by mouth 3 (three) times daily.   Dexilant 60 MG capsule Generic drug: dexlansoprazole Take 60 mg by mouth daily.   diclofenac Sodium 1 % Gel Commonly known as: VOLTAREN Apply 1 application topically 4 (four) times daily as needed for pain.   Fluticasone-Salmeterol 500-50 MCG/DOSE Aepb Commonly known as: ADVAIR Inhale 1 puff into the lungs 2 (two) times daily.   gabapentin 300 MG capsule Commonly known as: NEURONTIN Take 300 mg by mouth 3 (three) times daily.   Latuda 40 MG Tabs tablet Generic drug: lurasidone Take 40 mg by mouth daily.   Linzess 290 MCG Caps capsule Generic drug: linaclotide Take 290 mcg by mouth daily.   ondansetron 4 MG disintegrating tablet Commonly known as: ZOFRAN-ODT Take 1 tablet (4 mg total) by mouth every 6 (six) hours as needed for nausea or vomiting.   oxyCODONE 5 MG immediate release tablet Commonly known as: Oxy IR/ROXICODONE Take 1 tablet (5 mg total) by mouth every 6 (six) hours as needed for severe pain.   SUMAtriptan 100 MG tablet Commonly known as: IMITREX Take 100 mg by mouth every 2 (two) hours as needed for migraine.          Signed: Pedro Earls 01/10/2021, 7:36 AM

## 2021-01-10 NOTE — Progress Notes (Signed)
Pt tolerated 1 container of protein. At 0445, pt had an episode of nausea and was given zofran. Small amount of emesis.

## 2021-01-10 NOTE — Discharge Instructions (Signed)
Eating Plan After Bariatric Surgery, Stage 1 A diet after weight loss surgery (bariatric surgery) should provide plenty of fluids and nutrients while promoting weight loss and healing. Each stage of recovery has a different set of food and drink recommendations. Your health care provider may recommend that you work with a diet and nutrition specialist (dietitian) to make a staged eating plan that is right for you. Stage 1 begins right after surgery and lasts until about 2 weeks after surgery, or as long as directed by your health care provider. During this time, you will eat a liquid-only diet. You will be on clear liquids immediately after surgery. After your health care provider approves, you will move on to full liquids. What are tips for following this plan? Meal planning  Eat at set times. Allow 30-45 minutes for each meal.  Have protein with every meal. Eat protein foods first. Protein is a very important nutrient after surgery.  You will need at least 60-80 g of protein daily, or as much as determined by your dietitian. Work with your dietitian to choose a liquid protein supplement that has: ? At least 15 g of protein per 8 oz serving. ? Less than 20 g total carbohydrate per 8 oz serving. ? Less than 5 g fat per 8 oz serving.  To get more protein, you may add 1 Tbsp non-fat dry milk powder to each  cup of skim milk.  Do not eat or drink: ? Carbonated beverages. ? Sweets with more than 25 g of sugar per serving. ? High-fat foods. This includes foods with more than 5 g of fat per serving. ? Alcohol. ? Any foods you do not tolerate well, such as dairy or high-fiber foods.  Move on to a soft-food diet when your health care provider approves. For most people, this happens after 2 weeks on a liquid-only diet. General instructions  Sip liquids. Do not use a straw until several weeks after surgery.  Do not drink extra liquids with meals for 30 minutes before or after meals.  Slowly sip  8-10 oz of clear liquid, preferably water, between each meal. Try to get at least 48-60 oz of fluid each day.  Take a liquid or chewable multivitamin with iron each day. Discuss additional supplements with your health care provider or dietitian.  Stop eating when you feel full.  Your surgeon or dietitian may have specific eating guidelines for you. This may depend on: ? The type of weight loss surgery you had. ? Your overall health.   Clear liquids It is important to drink clear liquids to make sure you stay hydrated. Try to drink at least 24-30 oz (about 3 cups) of clear liquids each day. Clear liquids have:  No carbonation.  No sugar or calories.  No caffeine or alcohol. Clear liquids include:  Water and flavored water.  Decaffeinated coffee or tea.  "Light" powdered juice mixes, like lemonade.  Clear broth.  "Light" flavored gelatin.  Sugar-free popsicles. Full liquids Full liquids are important for making sure you have enough calories, protein, and other nutrients. Try to get at least 24-30 oz (about 3 cups) of full liquids each day. Full liquids include:  Low-fat or fat-free milk.  Soy milk.  Yogurt.  Protein shakes or protein powder.  "Light" meal replacement shakes.  Sugar-free pudding. Summary  Stage 1 begins right after surgery and lasts until about 2 weeks after surgery, or as directed by your health care provider.  Stage 1 diet is a   liquid-only diet. You will be on clear liquids immediately after surgery. After your health care provider approves, you will move to full liquids.  Eating enough protein and drinking plenty of fluids are important in promoting weight loss and healing after surgery. This information is not intended to replace advice given to you by your health care provider. Make sure you discuss any questions you have with your health care provider. Document Revised: 03/22/2020 Document Reviewed: 03/22/2020 Elsevier Patient Education  2021  [employer]  

## 2021-01-10 NOTE — Progress Notes (Signed)
Pt reported lower right arm swelling. Upon assessment, slight swelling was noted. The left arm has slight swelling d/t IV infiltration on 2/9. The right arm has not had an IV. Pt reported that she has a past history of clot in her arm d/t an IV infiltration during a different hospital stay. Pt has refused all heparin injection during night shift. Pt educated on the importance of heparin administration. Pt refused again.

## 2021-01-10 NOTE — Progress Notes (Signed)
PHARMACY CONSULT FOR:  Risk Assessment for Post-Discharge VTE Following Bariatric Surgery  Post-Discharge VTE Risk Assessment: This patient's probability of 30-day post-discharge VTE is increased due to the factors marked:   Female    Age >/=60 years    BMI >/=50 kg/m2    CHF    Dyspnea at Rest    Paraplegia   X Non-gastric-band surgery    Operation Time >/=3 hr    Return to OR    X Length of Stay >/= 3 d   Hx of VTE   Hypercoagulable condition   Significant venous stasis       Predicted probability of 30-day post-discharge VTE: 0.25%  Other patient-specific factors to consider: N/A   Recommendation for Discharge: No pharmacologic prophylaxis post-discharge  Brittney Sanford is a 55 y.o. female who underwent laparoscopic repair of hiatal hernia, sleeve gastrectomy, and upper endoscopy on 01/07/2021.    Case start: 1005 Case end: 1130   Allergies  Allergen Reactions  . Lithium Shortness Of Breath    Difficulty breathing  . Aspirin Rash    Patient Measurements: Height: 5' 4" (162.6 cm) Weight: 116.8 kg (257 lb 6.4 oz) IBW/kg (Calculated) : 54.7 Body mass index is 44.18 kg/m.  Recent Labs    01/08/21 0450 01/09/21 0508 01/10/21 0518  WBC 14.0* 14.3* 11.2*  HGB 13.8 14.0 13.8  HCT 42.0 42.1 41.2  PLT 210 209 185  CREATININE  --   --  1.08*   Estimated Creatinine Clearance: 74.7 mL/min (A) (by C-G formula based on SCr of 1.08 mg/dL (H)).    Past Medical History:  Diagnosis Date  . Anemia   . Anxiety   . Asthma   . Chronic back pain   . COPD (chronic obstructive pulmonary disease) ([place])   . Depression   . Fibromyalgia   . GERD (gastroesophageal reflux disease)   . Headache    MIGRAINES  . Hyperlipidemia   . 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 Rheumatoid arthritis ([place])   . Sleep apnea    CPAP      Medications Prior to Admission  Medication Sig Dispense Refill Last Dose  . ALPRAZolam (XANAX) 0.5 MG tablet Take 0.5 mg by mouth  2 (two) times daily as needed for anxiety.   01/07/2021 at 0400  . Calcium Citrate-Vitamin D (CELEBRATE CALCIUM PLUS 500) 500-333 MG-UNIT CHEW Chew 1 tablet by mouth in the morning, at noon, in the evening, and at bedtime.   01/06/2021 at Unknown time  . cyclobenzaprine (FLEXERIL) 10 MG tablet Take 10 mg by mouth 3 (three) times daily.   Past Month at Unknown time  . DEXILANT 60 MG capsule Take 60 mg by mouth daily.   01/07/2021 at 0400  . diclofenac Sodium (VOLTAREN) 1 % GEL Apply 1 application topically 4 (four) times daily as needed for pain.   Past Month at Unknown time  . gabapentin (NEURONTIN) 300 MG capsule Take 300 mg by mouth 3 (three) times daily.   Past Month at Unknown time  . LATUDA 40 MG TABS tablet Take 40 mg by mouth daily.   Past Month at Unknown time  . LINZESS 290 MCG CAPS capsule Take 290 mcg by mouth daily.   01/07/2021 at 0400  . meloxicam (MOBIC) 7.5 MG tablet Take 7.5 mg by mouth 2 (two) times daily.   Past Week at Unknown time  . Multiple Vitamins-Minerals (BARIATRIC MULTIVITAMINS/IRON PO) Take 1 tablet by mouth daily. W/45 mg  IRON   01/06/2021 at Unknown time  . NARCAN 4 MG/0.1ML LIQD nasal spray kit Place 1 spray into the nose as needed (accidnetal overdose).     Marland Kitchen oxyCODONE-acetaminophen (PERCOCET) 10-325 MG tablet Take 1 tablet by mouth in the morning, at noon, and at bedtime.   01/06/2021 at Unknown time  . SUMAtriptan (IMITREX) 100 MG tablet Take 100 mg by mouth every 2 (two) hours as needed for migraine.   Past Month at Unknown time  . albuterol (PROVENTIL) (2.5 MG/3ML) 0.083% nebulizer solution Take 2.5 mg by nebulization 4 (four) times daily as needed for wheezing or shortness of breath.   More than a month at Unknown time  . albuterol (VENTOLIN HFA) 108 (90 Base) MCG/ACT inhaler Inhale 2 puffs into the lungs every 6 (six) hours as needed for wheezing or shortness of breath.   More than a month at Unknown time  . Fluticasone-Salmeterol (ADVAIR) 500-50 MCG/DOSE AEPB Inhale 1 puff  into the lungs 2 (two) times daily.   More than a month at Unknown time     Eudelia Bunch, Pharm.D 01/10/2021 8:41 AM

## 2021-01-10 NOTE — Plan of Care (Signed)

## 2021-01-11 ENCOUNTER — Telehealth (HOSPITAL_COMMUNITY): Payer: Self-pay | Admitting: *Deleted

## 2021-01-11 LAB — PARATHYROID HORMONE, INTACT (NO CA): PTH: 62 pg/mL (ref 15–65)

## 2021-01-11 NOTE — Telephone Encounter (Signed)
Called patient to follow up since d/c yesterday.  Pt states that she is "feeling much better". She states that she has only had one episode of nausea this morning.  Pt states that she has been able to consume 8oz of fluid (some coffee, protein shake, and water) this morning with 42g of protein added. Pt counseled about goal of 60g of protein and 64oz of water daily.  Discussed s/sx of dehydration.  Patient able to verbalize s/sx and when to call [facility], [facility], and [facility]. Will continue to monitor and follow up with patient again next week.

## 2021-01-18 ENCOUNTER — Telehealth (HOSPITAL_COMMUNITY): Payer: Self-pay | Admitting: *Deleted

## 2021-01-18 NOTE — Telephone Encounter (Signed)
Patient called to discuss post bariatric surgery follow up questions.  See below:  1.  Tell me about your pain and pain management? Pt denies any pain.   2.  Let's talk about fluid intake.  How much total fluid are you taking in? Pt states that she is getting in at least 64oz of fluid including protein shakes, bottled water, and coffee.   3.  How much protein have you taken in the last 2 days? Pt states she is meeting her goal of 60g of protein with the protein shakes.   4.  Have you had nausea?  Tell me about when have experienced nausea and what you did to help? Pt denies nausea.   5.  Has the frequency or color changed with your urine? Pt states that she is urinating ok with no changes in frequency or urgency.     6.  Tell me what your incisions look like? "The glue looks fine".  Pt denies a fever, chills.  Pt states that it is not swollen, open, or draining.  Pt encouraged to call [facility] if incisions change.   7.  Have you been passing gas? BM? Pt states that she is having BMs. Last BM 01/18/21.     8.  If a problem or question were to arise who would you call?  Do you know contact numbers for [facility], [facility], and [facility]? Pt can describe s/sx of dehydration.  Pt knows to call [facility] for surgical, [facility] for nutrition, and [facility] for non-urgent questions or concerns.   9.  How has the walking going? Pt states she is walking 30 minutes on the treadmill at a slow pace and able to be active without difficulty.    10.  How are your vitamins and calcium going?  How are you taking them? Pt states that she is taking her bariatric vitamin first thing in the morning, but is having some trouble getting in the third calcium at the end of the day.  Patient counseled about setting a timer and trying to take calcium either before or after drinking evening protein shake.

## 2021-01-22 ENCOUNTER — Encounter: Payer: Medicare HMO | Attending: Surgery | Admitting: Skilled Nursing Facility1

## 2021-01-22 ENCOUNTER — Other Ambulatory Visit: Payer: Self-pay

## 2021-01-22 DIAGNOSIS — E669 Obesity, unspecified: Secondary | ICD-10-CM

## 2021-01-22 NOTE — Progress Notes (Signed)
2 Week Post-Operative Nutrition Class   Patient was seen on 01/25/19 for Post-Operative Nutrition education at the [facility].   Pt state she has been constipated been taking linzess everyday so feeling nausea and vomiting stating she is starting to have bowel movements now.  Pt states she does have vertigo.  Pt states she's has tried [employer] chili already having purred it. Pt states she uses [employer] app for recipes.    Surgery date: 01/07/2021 Surgery type: Sleeve Start weight at [facility]: 281.7 Weight today: 243.3  Body Composition Scale 01/22/2021  Current Body Weight 243.3  Total Body Fat % 46.6  Visceral Fat 17  Fat-Free Mass % 53.3   Total Body Water % 41.1  Muscle-Mass lbs 29.3  BMI 41.7  Body Fat Displacement          Torso  lbs 70.4         Left Leg  lbs 14         Right Leg  lbs 14         Left Arm  lbs 7         Right Arm   lbs 7      The following the learning objectives were met by the patient during this course:  Identifies Phase 3 (Soft, High Proteins) Dietary Goals and will begin from 2 weeks post-operatively to 2 months post-operatively  Identifies appropriate sources of fluids and proteins   Identifies appropriate fat sources and healthy verses unhealthy fat types    States protein recommendations and appropriate sources post-operatively  Identifies the need for appropriate texture modifications, mastication, and bite sizes when consuming solids  Identifies appropriate multivitamin and calcium sources post-operatively  Describes the need for physical activity post-operatively and will follow MD recommendations  States when to call healthcare provider regarding medication questions or post-operative complications   Handouts given during class include:  Phase 3A: Soft, High Protein Diet Handout  Phase 3 High Protein Meals  Healthy Fats   Follow-Up Plan: Patient will follow-up at [facility] in 6 weeks for 2 month post-op  nutrition visit for diet advancement per MD.

## 2021-01-28 ENCOUNTER — Telehealth: Payer: Self-pay | Admitting: Skilled Nursing Facility1

## 2021-01-28 NOTE — Telephone Encounter (Signed)
RD called pt to verify fluid intake once starting soft, solid proteins 2 week post-bariatric surgery.   Daily Fluid intake:64 oz Daily Protein intake: 60 g  Concerns/issues:   None stated. 

## 2021-02-05 ENCOUNTER — Emergency Department (HOSPITAL_COMMUNITY)
Admission: EM | Admit: 2021-02-05 | Discharge: 2021-02-05 | Disposition: A | Discharge status: Home / Self Care | Payer: Medicare HMO | Attending: Emergency Medicine | Admitting: Emergency Medicine

## 2021-02-05 ENCOUNTER — Telehealth: Payer: Self-pay | Admitting: Skilled Nursing Facility1

## 2021-02-05 ENCOUNTER — Encounter (HOSPITAL_COMMUNITY): Payer: Self-pay

## 2021-02-05 ENCOUNTER — Other Ambulatory Visit: Payer: Self-pay

## 2021-02-05 ENCOUNTER — Emergency Department (HOSPITAL_COMMUNITY): Payer: Medicare HMO

## 2021-02-05 DIAGNOSIS — J449 Chronic obstructive pulmonary disease, unspecified: Secondary | ICD-10-CM | POA: Diagnosis not present

## 2021-02-05 DIAGNOSIS — J45909 Unspecified asthma, uncomplicated: Secondary | ICD-10-CM | POA: Insufficient documentation

## 2021-02-05 DIAGNOSIS — Z87891 Personal history of nicotine dependence: Secondary | ICD-10-CM | POA: Diagnosis not present

## 2021-02-05 DIAGNOSIS — R1110 Vomiting, unspecified: Secondary | ICD-10-CM | POA: Diagnosis present

## 2021-02-05 DIAGNOSIS — E860 Dehydration: Secondary | ICD-10-CM | POA: Diagnosis not present

## 2021-02-05 LAB — CBC
HCT: 44.1 % (ref 36.0–46.0)
Hemoglobin: 14.9 g/dL (ref 12.0–15.0)
MCH: 29.7 pg (ref 26.0–34.0)
MCHC: 33.8 g/dL (ref 30.0–36.0)
MCV: 87.8 fL (ref 80.0–100.0)
Platelets: 186 10*3/uL (ref 150–400)
RBC: 5.02 MIL/uL (ref 3.87–5.11)
RDW: 16.3 % — ABNORMAL HIGH (ref 11.5–15.5)
WBC: 8.8 10*3/uL (ref 4.0–10.5)
nRBC: 0 % (ref 0.0–0.2)

## 2021-02-05 LAB — COMPREHENSIVE METABOLIC PANEL
ALT: 33 U/L (ref 0–44)
AST: 31 U/L (ref 15–41)
Albumin: 4.5 g/dL (ref 3.5–5.0)
Alkaline Phosphatase: 111 U/L (ref 38–126)
Anion gap: 12 (ref 5–15)
BUN: 7 mg/dL (ref 6–20)
CO2: 23 mmol/L (ref 22–32)
Calcium: 10.6 mg/dL — ABNORMAL HIGH (ref 8.9–10.3)
Chloride: 107 mmol/L (ref 98–111)
Creatinine, Ser: 0.89 mg/dL (ref 0.44–1.00)
GFR, Estimated: >60 mL/min (ref >60)
Glucose, Bld: 94 mg/dL (ref 70–99)
Potassium: 3.4 mmol/L — ABNORMAL LOW (ref 3.5–5.1)
Sodium: 142 mmol/L (ref 135–145)
Total Bilirubin: 0.8 mg/dL (ref 0.3–1.2)
Total Protein: 8.2 g/dL — ABNORMAL HIGH (ref 6.5–8.1)

## 2021-02-05 LAB — URINALYSIS, ROUTINE W REFLEX MICROSCOPIC
Bacteria, UA: NONE SEEN
Bilirubin Urine: NEGATIVE
Glucose, UA: NEGATIVE mg/dL
Ketones, ur: 80 mg/dL — AB
Leukocytes,Ua: NEGATIVE
Nitrite: NEGATIVE
Protein, ur: 30 mg/dL — AB
Specific Gravity, Urine: 1.03 (ref 1.005–1.030)
pH: 5 (ref 5.0–8.0)

## 2021-02-05 LAB — LIPASE, BLOOD: Lipase: 34 U/L (ref 11–51)

## 2021-02-05 MED ORDER — SODIUM CHLORIDE 0.9 % IV BOLUS
1000.0000 mL | Freq: Once | INTRAVENOUS | Status: AC
  Administered 2021-02-05: 1000 mL via INTRAVENOUS

## 2021-02-05 MED ORDER — ONDANSETRON HCL 4 MG/2ML IJ SOLN
4.0000 mg | Freq: Once | INTRAMUSCULAR | Status: AC
  Administered 2021-02-05: 4 mg via INTRAVENOUS
  Filled 2021-02-05: qty 2

## 2021-02-05 MED ORDER — IOHEXOL 300 MG/ML  SOLN
100.0000 mL | Freq: Once | INTRAMUSCULAR | Status: AC | PRN
  Administered 2021-02-05: 100 mL via INTRAVENOUS

## 2021-02-05 NOTE — ED Notes (Signed)
Pt made aware of urine sample needed. 

## 2021-02-05 NOTE — Telephone Encounter (Signed)
Returned pts missed call.  Everything she eats is coming back up including protein shakes and water so will be seeing the surgeon today.  Dietitian advised pt to follow what her surgeon advises and to call back if nothing comes up in the diagnostics. Just try to sip on fluids and not attempt anymore solid foods until you get to your surgeon

## 2021-02-05 NOTE — ED Provider Notes (Signed)
[facility]-EMERGENCY DEPT Provider Note   CSN: [id number] Arrival date & time: 02/05/21  1651     History Chief Complaint  Patient presents with  . Emesis    Girlie Veltri is a 55 y.o. female.  Patient had a gastric sleeve placed February 7.  She was seen in the surgeon's office today for vomiting.  They sent her over here for IV fluids and evaluation.  Patient not having abdominal pain  The history is provided by the patient and medical records. No language interpreter was used.  Emesis Severity:  Moderate Timing:  Intermittent Quality:  Unable to specify Able to tolerate:  Liquids Associated symptoms: no abdominal pain, no cough, no diarrhea and no headaches        Past Medical History:  Diagnosis Date  . Anemia   . Anxiety   . Asthma   . Chronic back pain   . COPD (chronic obstructive pulmonary disease) (HCC)   . Depression   . Fibromyalgia   . GERD (gastroesophageal reflux disease)   . Headache    MIGRAINES  . Hyperlipidemia   . Pseudoaneurysm (HCC)    behind left ear per pt   . Rheumatoid arthritis (HCC)   . Sleep apnea    CPAP     Patient Active Problem List   Diagnosis Date Noted  . 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 Obesity 01/10/2020    Past Surgical History:  Procedure Laterality Date  . ABDOMINAL HYSTERECTOMY    . APPENDECTOMY    . BACK SURGERY    . CHOLECYSTECTOMY    . LAPAROSCOPIC GASTRIC SLEEVE RESECTION N/A 01/07/2021   Procedure: LAPAROSCOPIC GASTRIC SLEEVE RESECTION AND HIATAL HERNIA REPAIR;  Surgeon: Luretha Murphy, MD;  Location: [facility];  Service: General;  Laterality: N/A;  . OVARIAN CYST REMOVAL    . UPPER GI ENDOSCOPY N/A 01/07/2021   Procedure: UPPER GI ENDOSCOPY;  Surgeon: Luretha Murphy, MD;  Location: [facility];  Service: General;  Laterality: N/A;     OB History   No obstetric history on file.     Family History  Problem Relation Age of Onset  . COPD Mother   . Emphysema Mother   .  Hypertension Father     Social History   Tobacco Use  . Smoking status: Former Smoker    Packs/day: 1.00    Types: Cigarettes    Quit date: 10/16/2019    Years since quitting: 1.3  . Smokeless tobacco: Never Used  Vaping Use  . Vaping Use: Never used  Substance Use Topics  . Alcohol use: No  . Drug use: No    Frequency: 2.0 times per week    Home Medications Prior to Admission medications   Medication Sig Start Date End Date Taking? Authorizing Provider  albuterol (PROVENTIL) (2.5 MG/3ML) 0.083% nebulizer solution Take 2.5 mg by nebulization 4 (four) times daily as needed for wheezing or shortness of breath.   Yes [provider]  albuterol (VENTOLIN HFA) 108 (90 Base) MCG/ACT inhaler Inhale 2 puffs into the lungs every 6 (six) hours as needed for wheezing or shortness of breath.   Yes [provider]  ALPRAZolam Prudy Feeler) 0.5 MG tablet Take 0.5 mg by mouth 2 (two) times daily as needed for anxiety. 07/09/20  Yes [provider]  Calcium Citrate-Vitamin D (CELEBRATE CALCIUM PLUS 500) 500-333 MG-UNIT CHEW Chew 1 tablet by mouth in the morning, at noon, in the evening, and at bedtime.   Yes [provider]  cyclobenzaprine (FLEXERIL) 10 MG tablet Take 10 mg by mouth 3 (three) times daily as needed for muscle spasms.   Yes [provider]  DEXILANT 60 MG capsule Take 60 mg by mouth daily. 11/04/20  Yes [provider]  gabapentin (NEURONTIN) 300 MG capsule Take 300 mg by mouth 3 (three) times daily as needed (nerve pain).   Yes [provider]  LATUDA 40 MG TABS tablet Take 40 mg by mouth daily. 11/14/20  Yes [provider]  LINZESS 290 MCG CAPS capsule Take 290 mcg by mouth daily. 09/18/20  Yes [provider]  Multiple Vitamins-Minerals (BARIATRIC MULTIVITAMINS/IRON PO) Take 1 tablet by mouth daily. W/45 mg IRON   Yes [provider]  oxyCODONE-acetaminophen (PERCOCET) 10-325 MG tablet Take 1 tablet  by mouth every 4 (four) hours as needed for pain.   Yes [provider]  ondansetron (ZOFRAN-ODT) 4 MG disintegrating tablet Take 1 tablet (4 mg total) by mouth every 6 (six) hours as needed for nausea or vomiting. Patient not taking: No sig reported 01/10/21   Luretha Murphy, MD  oxyCODONE (OXY IR/ROXICODONE) 5 MG immediate release tablet Take 1 tablet (5 mg total) by mouth every 6 (six) hours as needed for severe pain. Patient not taking: No sig reported 01/10/21   Luretha Murphy, MD  SUMAtriptan (IMITREX) 100 MG tablet Take 100 mg by mouth every 2 (two) hours as needed for migraine. 10/08/20   [provider]    Allergies    Lithium and Aspirin  Review of Systems   Review of Systems  Constitutional: Negative for appetite change and fatigue.  HENT: Negative for congestion, ear discharge and sinus pressure.   Eyes: Negative for discharge.  Respiratory: Negative for cough.   Cardiovascular: Negative for chest pain.  Gastrointestinal: Positive for vomiting. Negative for abdominal pain and diarrhea.  Genitourinary: Negative for frequency and hematuria.  Musculoskeletal: Negative for back pain.  Skin: Negative for rash.  Neurological: Negative for seizures and headaches.  Psychiatric/Behavioral: Negative for hallucinations.    Physical Exam Updated Vital Signs BP 119/73 (BP Location: Right Arm)   Pulse (!) 57   Temp 97.8 F (36.6 C) (Oral)   Resp 19   Ht  (1.626 m)   Wt 109 kg   SpO2 99%   BMI 41.26 kg/m   Physical Exam Vitals and nursing note reviewed.  Constitutional:      Appearance: She is well-developed.  HENT:     Head: Normocephalic.     Nose: Nose normal.  Eyes:     General: No scleral icterus.    Extraocular Movements: EOM normal.     Conjunctiva/sclera: Conjunctivae normal.  Neck:     Thyroid: No thyromegaly.  Cardiovascular:     Rate and Rhythm: Normal rate and regular rhythm.     Heart sounds: No murmur heard. No friction rub. No  gallop.   Pulmonary:     Breath sounds: No stridor. No wheezing or rales.  Chest:     Chest wall: No tenderness.  Abdominal:     General: There is no distension.     Tenderness: There is no abdominal tenderness. There is no rebound.  Musculoskeletal:        General: No edema. Normal range of motion.     Cervical back: Neck supple.  Lymphadenopathy:     Cervical: No cervical adenopathy.  Skin:    Findings: No erythema or rash.  Neurological:     Mental Status: She  is oriented to person, place, and time.     Motor: No abnormal muscle tone.     Coordination: Coordination normal.  Psychiatric:        Mood and Affect: Mood and affect normal.        Behavior: Behavior normal.     ED Results / Procedures / Treatments   Labs (all labs ordered are listed, but only abnormal results are displayed) Labs Reviewed  COMPREHENSIVE METABOLIC PANEL - Abnormal; Notable for the following components:      Result Value   Potassium 3.4 (*)    Calcium 10.6 (*)    Total Protein 8.2 (*)    All other components within normal limits  CBC - Abnormal; Notable for the following components:   RDW 16.3 (*)    All other components within normal limits  URINALYSIS, ROUTINE W REFLEX MICROSCOPIC - Abnormal; Notable for the following components:   Hgb urine dipstick SMALL (*)    Ketones, ur 80 (*)    Protein, ur 30 (*)    All other components within normal limits  LIPASE, BLOOD    EKG None  Radiology CT ABDOMEN PELVIS W CONTRAST  Result Date: 02/05/2021 CLINICAL DATA:  Abdominal abscess/infection suspected Gastric sleeve 01/07/2021.  Abdominal pain and vomiting. EXAM: CT ABDOMEN AND PELVIS WITH CONTRAST TECHNIQUE: Multidetector CT imaging of the abdomen and pelvis was performed using the standard protocol following bolus administration of intravenous contrast. CONTRAST:  OMNIPAQUE IOHEXOL 300 MG/ML  SOLN COMPARISON:  None. FINDINGS: Lower chest: Hypoventilatory atelectasis in the lung bases. Trace  right pleural effusion. 5 mm subpleural nodule in the right lower lobe, series 6, image 2. Hepatobiliary: Decreased density adjacent to the falciform ligament and gallbladder fossa consistent with focal fatty infiltration. Minimal background hepatic steatosis. Cholecystectomy without biliary dilatation. Pancreas: Unremarkable. No pancreatic ductal dilatation or surrounding inflammatory changes. Spleen: Normal in size without focal abnormality. Adrenals/Urinary Tract: Normal adrenal glands. No hydronephrosis or perinephric edema. Homogeneous renal enhancement with symmetric excretion on delayed phase imaging. Multiple low-density lesions in the left kidney, largest measuring 4.2 cm and consistent with simple cyst. Many of the smaller lesions are too small to accurately characterize. Urinary bladder is partially distended without wall thickening. Stomach/Bowel: Gastric suture line consistent with gastric sleeve. Tiny hiatal hernia. There is no gastric wall thickening. Normal positioning and appearance of the duodenum. Decompressed small bowel without obstruction or inflammation. Appendectomy with surgical staple line at the bases cecum. Occasional colonic diverticula in the descending and sigmoid colon. No diverticulitis. No colonic wall thickening or pericolonic edema. Vascular/Lymphatic: Mild aortic atherosclerosis. No aortic aneurysm. Patent portal vein. No mesenteric gas. Small retroperitoneal lymph nodes are not enlarged by size criteria. No pelvic adenopathy. Reproductive: Hysterectomy. 3.6 cm multi circumscribed low attenuating cystic lesion in the right pelvis abutting the vaginal cuff is unchanged from prior exam may represent a peritoneal inclusion cyst. No suspicious adnexal mass. Other: No intra-abdominal abscess. No free air or ascites. There is a small fat containing supraumbilical ventral abdominal wall hernia with hernia neck measuring 2 cm transverse. No inflammation or bowel involvement.  Musculoskeletal: Posterior L4-S1 fusion. Mild degenerative change of the pubic symphysis. There are no acute or suspicious osseous abnormalities. IMPRESSION: 1. No acute abnormality or explanation for abdominal pain. 2. Post gastric sleeve without complication.  No abscess. 3. Minimal colonic diverticulosis without diverticulitis. 4. Small fat containing upper abdominal ventral wall hernia. 5. Trace right pleural effusion. 6. Right lower lobe 5 mm pulmonary nodule is not  significantly changed from 2019 CT and considered benign. Aortic Atherosclerosis (ICD10-I70.0). Electronically Signed   By: Narda Rutherford M.D.   On: 02/05/2021 20:07    Procedures Procedures   Medications Ordered in ED Medications  sodium chloride 0.9 % bolus 1,000 mL (1,000 mLs Intravenous Bolus 02/05/21 1739)  ondansetron (ZOFRAN) injection 4 mg (4 mg Intravenous Given 02/05/21 1738)  iohexol (OMNIPAQUE) 300 MG/ML solution 100 mL (100 mLs Intravenous Contrast Given 02/05/21 1947)    ED Course  I have reviewed the triage vital signs and the nursing notes.  Pertinent labs & imaging results that were available during my care of the patient were reviewed by me and considered in my medical decision making (see chart for details).    MDM Rules/Calculators/A&P                          CT scan unremarkable.  Patient was hydrated with normal saline.  Her nausea improved with Zofran.  She is going to take Zofran at home and stick to liquids and then contact her surgeon's office tomorrow Final Clinical Impression(s) / ED Diagnoses Final diagnoses:  Dehydration    Rx / DC Orders ED Discharge Orders    None       Bethann Berkshire, MD 02/05/21 2024

## 2021-02-05 NOTE — Telephone Encounter (Signed)
Returned pt's call. LVM.

## 2021-02-05 NOTE — ED Notes (Signed)
Patient transported to CT 

## 2021-02-05 NOTE — Discharge Instructions (Addendum)
Take liquids only until you are surgeon said he can try other foods.  Use of Zofran for nausea.  Call them tomorrow and let them know how you are doing

## 2021-02-05 NOTE — ED Triage Notes (Signed)
Patient reports that she had a gastric sleeve done on 01/07/21. Patient was instructed by [facility] to come to the ED for IV fluids.  Patient c/o upper mid abdominal pain and emesis x 3 days

## 2021-02-06 ENCOUNTER — Other Ambulatory Visit (HOSPITAL_COMMUNITY): Payer: Self-pay | Admitting: Surgery

## 2021-02-06 ENCOUNTER — Other Ambulatory Visit: Payer: Self-pay | Admitting: Surgery

## 2021-02-06 DIAGNOSIS — R112 Nausea with vomiting, unspecified: Secondary | ICD-10-CM

## 2021-02-11 ENCOUNTER — Ambulatory Visit (HOSPITAL_COMMUNITY)
Admission: RE | Admit: 2021-02-11 | Discharge: 2021-02-11 | Disposition: A | Discharge status: Home / Self Care | Payer: Medicare HMO | Source: Ambulatory Visit | Attending: Surgery | Admitting: Surgery

## 2021-02-11 ENCOUNTER — Other Ambulatory Visit: Payer: Self-pay

## 2021-02-11 ENCOUNTER — Other Ambulatory Visit (HOSPITAL_COMMUNITY): Payer: Self-pay | Admitting: Surgery

## 2021-02-11 DIAGNOSIS — R112 Nausea with vomiting, unspecified: Secondary | ICD-10-CM

## 2021-02-22 ENCOUNTER — Ambulatory Visit (HOSPITAL_COMMUNITY): Payer: Medicare HMO

## 2021-02-27 ENCOUNTER — Emergency Department (HOSPITAL_COMMUNITY)
Admission: EM | Admit: 2021-02-27 | Discharge: 2021-02-27 | Disposition: A | Discharge status: Home / Self Care | Payer: Medicare HMO | Attending: Emergency Medicine | Admitting: Emergency Medicine

## 2021-02-27 ENCOUNTER — Emergency Department (HOSPITAL_COMMUNITY): Payer: Medicare HMO

## 2021-02-27 ENCOUNTER — Other Ambulatory Visit: Payer: Self-pay

## 2021-02-27 ENCOUNTER — Encounter (HOSPITAL_COMMUNITY): Payer: Self-pay

## 2021-02-27 DIAGNOSIS — R109 Unspecified abdominal pain: Secondary | ICD-10-CM | POA: Diagnosis not present

## 2021-02-27 DIAGNOSIS — J449 Chronic obstructive pulmonary disease, unspecified: Secondary | ICD-10-CM | POA: Insufficient documentation

## 2021-02-27 DIAGNOSIS — J45909 Unspecified asthma, uncomplicated: Secondary | ICD-10-CM | POA: Insufficient documentation

## 2021-02-27 DIAGNOSIS — R1110 Vomiting, unspecified: Secondary | ICD-10-CM

## 2021-02-27 DIAGNOSIS — E876 Hypokalemia: Secondary | ICD-10-CM | POA: Diagnosis not present

## 2021-02-27 DIAGNOSIS — F17210 Nicotine dependence, cigarettes, uncomplicated: Secondary | ICD-10-CM | POA: Diagnosis not present

## 2021-02-27 DIAGNOSIS — R112 Nausea with vomiting, unspecified: Secondary | ICD-10-CM | POA: Diagnosis present

## 2021-02-27 LAB — COMPREHENSIVE METABOLIC PANEL
ALT: 31 U/L (ref 0–44)
AST: 25 U/L (ref 15–41)
Albumin: 4.1 g/dL (ref 3.5–5.0)
Alkaline Phosphatase: 85 U/L (ref 38–126)
Anion gap: 10 (ref 5–15)
BUN: 7 mg/dL (ref 6–20)
CO2: 26 mmol/L (ref 22–32)
Calcium: 10.1 mg/dL (ref 8.9–10.3)
Chloride: 107 mmol/L (ref 98–111)
Creatinine, Ser: 0.77 mg/dL (ref 0.44–1.00)
GFR, Estimated: >60 mL/min (ref >60)
Glucose, Bld: 96 mg/dL (ref 70–99)
Potassium: 3.3 mmol/L — ABNORMAL LOW (ref 3.5–5.1)
Sodium: 143 mmol/L (ref 135–145)
Total Bilirubin: 0.7 mg/dL (ref 0.3–1.2)
Total Protein: 7.6 g/dL (ref 6.5–8.1)

## 2021-02-27 LAB — URINALYSIS, ROUTINE W REFLEX MICROSCOPIC
Bilirubin Urine: NEGATIVE
Glucose, UA: NEGATIVE mg/dL
Ketones, ur: 80 mg/dL — AB
Leukocytes,Ua: NEGATIVE
Nitrite: NEGATIVE
Protein, ur: 30 mg/dL — AB
Specific Gravity, Urine: 1.031 — ABNORMAL HIGH (ref 1.005–1.030)
pH: 5 (ref 5.0–8.0)

## 2021-02-27 LAB — CBC
HCT: 43.4 % (ref 36.0–46.0)
Hemoglobin: 14.6 g/dL (ref 12.0–15.0)
MCH: 30.3 pg (ref 26.0–34.0)
MCHC: 33.6 g/dL (ref 30.0–36.0)
MCV: 90 fL (ref 80.0–100.0)
Platelets: 171 10*3/uL (ref 150–400)
RBC: 4.82 MIL/uL (ref 3.87–5.11)
RDW: 16.6 % — ABNORMAL HIGH (ref 11.5–15.5)
WBC: 7.7 10*3/uL (ref 4.0–10.5)
nRBC: 0 % (ref 0.0–0.2)

## 2021-02-27 LAB — LIPASE, BLOOD: Lipase: 27 U/L (ref 11–51)

## 2021-02-27 MED ORDER — METOCLOPRAMIDE HCL 5 MG/ML IJ SOLN
10.0000 mg | Freq: Once | INTRAMUSCULAR | Status: AC
  Administered 2021-02-27: 10 mg via INTRAVENOUS
  Filled 2021-02-27: qty 2

## 2021-02-27 MED ORDER — POTASSIUM CHLORIDE CRYS ER 20 MEQ PO TBCR
40.0000 meq | EXTENDED_RELEASE_TABLET | Freq: Once | ORAL | Status: AC
  Administered 2021-02-27: 20 meq via ORAL
  Filled 2021-02-27: qty 2

## 2021-02-27 MED ORDER — SODIUM CHLORIDE 0.9 % IV BOLUS
1000.0000 mL | Freq: Once | INTRAVENOUS | Status: AC
  Administered 2021-02-27: 1000 mL via INTRAVENOUS

## 2021-02-27 NOTE — ED Triage Notes (Signed)
Pt states since Sunday, pt has experienced N/V. Pt states for the past 24 hours she is only experiencing dry heaves and mucous. Pt states that on 2/7 she had weightloss surgery by Dr. Daphine Deutscher. Was told to come to ED due to prescribed Zofran and Phenergan not giving any relief with nausea. Pt is ambulatory, A&Ox4.

## 2021-02-27 NOTE — ED Notes (Addendum)
Patient denies pain and is resting comfortably. She currently denies nausea. Call bell within reach.

## 2021-02-27 NOTE — Discharge Instructions (Addendum)
If you develop worsening, continued, or recurrent abdominal pain, uncontrolled vomiting, fever, chest or back pain, or any other new/concerning symptoms then return to the ER for evaluation.  

## 2021-02-27 NOTE — ED Notes (Signed)
Pt verbalized understanding of d/c and follow up care. Ambulatory with steady gait.  

## 2021-02-27 NOTE — ED Provider Notes (Signed)
[facility]-EMERGENCY DEPT Provider Note   CSN: [id number] Arrival date & time: 02/27/21  1636     History Chief Complaint  Patient presents with  . Nausea  . Emesis    Brittney Sanford is a 55 y.o. female.  HPI 55 year old female presents with vomiting. Started on 3/27. She has been vomiting foam. Has chronic constipation but has been having fairly regular bowel movement. No blood in emesis. Recent gastric sleeve surgery. This feels similar to 3/8 when she came in for vomiting. Her abdomen feels sore, but no significant pain otherwise. No chest pain, fevers. Is on zofran/phenergan and still vomiting.   Past Medical History:  Diagnosis Date  . Anemia   . Anxiety   . Asthma   . Chronic back pain   . COPD (chronic obstructive pulmonary disease) (HCC)   . Depression   . Fibromyalgia   . GERD (gastroesophageal reflux disease)   . Headache    MIGRAINES  . Hyperlipidemia   . Pseudoaneurysm (HCC)    behind left ear per pt   . Rheumatoid arthritis (HCC)   . Sleep apnea    CPAP     Patient Active Problem List   Diagnosis Date Noted  . 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 Obesity 01/10/2020    Past Surgical History:  Procedure Laterality Date  . ABDOMINAL HYSTERECTOMY    . APPENDECTOMY    . BACK SURGERY    . CHOLECYSTECTOMY    . LAPAROSCOPIC GASTRIC SLEEVE RESECTION N/A 01/07/2021   Procedure: LAPAROSCOPIC GASTRIC SLEEVE RESECTION AND HIATAL HERNIA REPAIR;  Surgeon: Luretha Murphy, MD;  Location: [facility];  Service: General;  Laterality: N/A;  . OVARIAN CYST REMOVAL    . UPPER GI ENDOSCOPY N/A 01/07/2021   Procedure: UPPER GI ENDOSCOPY;  Surgeon: Luretha Murphy, MD;  Location: [facility];  Service: General;  Laterality: N/A;     OB History   No obstetric history on file.     Family History  Problem Relation Age of Onset  . COPD Mother   . Emphysema Mother   . Hypertension Father     Social History   Tobacco Use  . Smoking status:  Light Tobacco Smoker    Packs/day: 1.00    Types: Cigarettes    Last attempt to quit: 10/16/2019    Years since quitting: 1.3  . Smokeless tobacco: Never Used  Vaping Use  . Vaping Use: Never used  Substance Use Topics  . Alcohol use: No  . Drug use: No    Frequency: 2.0 times per week    Home Medications Prior to Admission medications   Medication Sig Start Date End Date Taking? Authorizing Provider  albuterol (PROVENTIL) (2.5 MG/3ML) 0.083% nebulizer solution Take 2.5 mg by nebulization 4 (four) times daily as needed for wheezing or shortness of breath.    [provider]  albuterol (VENTOLIN HFA) 108 (90 Base) MCG/ACT inhaler Inhale 2 puffs into the lungs every 6 (six) hours as needed for wheezing or shortness of breath.    [provider]  ALPRAZolam Prudy Feeler) 0.5 MG tablet Take 0.5 mg by mouth 2 (two) times daily as needed for anxiety. 07/09/20   [provider]  Calcium Citrate-Vitamin D (CELEBRATE CALCIUM PLUS 500) 500-333 MG-UNIT CHEW Chew 1 tablet by mouth in the morning, at noon, in the evening, and at bedtime.    [provider]  cyclobenzaprine (FLEXERIL) 10 MG tablet Take 10 mg by mouth 3 (three) times daily  as needed for muscle spasms.    [provider]  DEXILANT 60 MG capsule Take 60 mg by mouth daily. 11/04/20   [provider]  gabapentin (NEURONTIN) 300 MG capsule Take 300 mg by mouth 3 (three) times daily as needed (nerve pain).    [provider]  LATUDA 40 MG TABS tablet Take 40 mg by mouth daily. 11/14/20   [provider]  LINZESS 290 MCG CAPS capsule Take 290 mcg by mouth daily. 09/18/20   [provider]  Multiple Vitamins-Minerals (BARIATRIC MULTIVITAMINS/IRON PO) Take 1 tablet by mouth daily. W/45 mg IRON    [provider]  ondansetron (ZOFRAN-ODT) 4 MG disintegrating tablet Take 1 tablet (4 mg total) by mouth every 6 (six) hours as needed for nausea or vomiting. Patient not  taking: No sig reported 01/10/21   Luretha Murphy, MD  oxyCODONE (OXY IR/ROXICODONE) 5 MG immediate release tablet Take 1 tablet (5 mg total) by mouth every 6 (six) hours as needed for severe pain. Patient not taking: No sig reported 01/10/21   Luretha Murphy, MD  oxyCODONE-acetaminophen (PERCOCET) 10-325 MG tablet Take 1 tablet by mouth every 4 (four) hours as needed for pain.    [provider]  SUMAtriptan (IMITREX) 100 MG tablet Take 100 mg by mouth every 2 (two) hours as needed for migraine. 10/08/20   [provider]    Allergies    Lithium and Aspirin  Review of Systems   Review of Systems  Constitutional: Negative for fever.  Cardiovascular: Negative for chest pain.  Gastrointestinal: Positive for vomiting. Negative for abdominal pain, constipation and diarrhea.  All other systems reviewed and are negative.   Physical Exam Updated Vital Signs BP (!) 147/100   Pulse (!) 51   Temp 98 F (36.7 C) (Oral)   Resp 18   Ht  (1.626 m)   Wt 107 kg   SpO2 100%   BMI 40.51 kg/m   Physical Exam Vitals and nursing note reviewed.  Constitutional:      General: She is not in acute distress.    Appearance: She is well-developed. She is not ill-appearing or diaphoretic.  HENT:     Head: Normocephalic and atraumatic.     Right Ear: External ear normal.     Left Ear: External ear normal.     Nose: Nose normal.  Eyes:     General:        Right eye: No discharge.        Left eye: No discharge.  Cardiovascular:     Rate and Rhythm: Normal rate and regular rhythm.     Heart sounds: Normal heart sounds.  Pulmonary:     Effort: Pulmonary effort is normal.     Breath sounds: Normal breath sounds.  Abdominal:     General: There is no distension.     Palpations: Abdomen is soft.     Tenderness: There is no abdominal tenderness.  Skin:    General: Skin is warm and dry.  Neurological:     Mental Status: She is alert.  Psychiatric:        Mood and Affect:  Mood is not anxious.     ED Results / Procedures / Treatments   Labs (all labs ordered are listed, but only abnormal results are displayed) Labs Reviewed  COMPREHENSIVE METABOLIC PANEL - Abnormal; Notable for the following components:      Result Value   Potassium 3.3 (*)    All other components  within normal limits  CBC - Abnormal; Notable for the following components:   RDW 16.6 (*)    All other components within normal limits  URINALYSIS, ROUTINE W REFLEX MICROSCOPIC - Abnormal; Notable for the following components:   Color, Urine AMBER (*)    APPearance CLOUDY (*)    Specific Gravity, Urine 1.031 (*)    Hgb urine dipstick MODERATE (*)    Ketones, ur 80 (*)    Protein, ur 30 (*)    Bacteria, UA RARE (*)    All other components within normal limits  LIPASE, BLOOD    EKG EKG Interpretation  Date/Time:  Wednesday February 27 2021 18:50:21 EDT Ventricular Rate:  48 PR Interval:  156 QRS Duration: 85 QT Interval:  463 QTC Calculation: 414 R Axis:   31 Text Interpretation: Sinus bradycardia Atrial premature complex similar to sept 2021 Confirmed by Pricilla Loveless [phone]) on 02/27/2021 7:01:50 PM   Radiology DG ABD ACUTE 2+V W 1V CHEST  Result Date: 02/27/2021 CLINICAL DATA:  Vomiting, recent bariatric surgery EXAM: DG ABDOMEN ACUTE WITH 1 VIEW CHEST COMPARISON:  02/11/2021 FINDINGS: Supine and upright frontal views of the abdomen and pelvis as well as an upright frontal view of the chest are obtained. The cardiac silhouette is unremarkable. No airspace disease, effusion, or pneumothorax. Bowel gas pattern is unremarkable. No obstruction or ileus. Postsurgical changes are seen from bariatric surgery. Stable lower lumbar fusion. No free gas in the greater peritoneal sac. No masses or abnormal calcifications. IMPRESSION: 1. Unremarkable bowel gas pattern. 2. No acute intrathoracic process. Electronically Signed   By: Sharlet Salina M.D.   On: 02/27/2021 19:52     Procedures Procedures   Medications Ordered in ED Medications  sodium chloride 0.9 % bolus 1,000 mL (0 mLs Intravenous Stopped 02/27/21 2053)  metoCLOPramide (REGLAN) injection 10 mg (10 mg Intravenous Given 02/27/21 1849)  potassium chloride SA (KLOR-CON) CR tablet 40 mEq (20 mEq Oral Given 02/27/21 2023)    ED Course  I have reviewed the triage vital signs and the nursing notes.  Pertinent labs & imaging results that were available during my care of the patient were reviewed by me and considered in my medical decision making (see chart for details).    MDM Rules/Calculators/A&P                          Patient's abdominal exam is benign. She is feeling better with fluids and reglan. Had a negative CT earlier this month for similar presentation. I don't think CT is emergently needed. Tolerated potassium. Feels well enough for discharge. Will have her call her surgeon tomorrow for follow up.  Final Clinical Impression(s) / ED Diagnoses Final diagnoses:  Vomiting in adult  Hypokalemia    Rx / DC Orders ED Discharge Orders    None       Pricilla Loveless, MD 02/27/21 2219

## 2021-02-27 NOTE — ED Notes (Signed)
Pt could only tolerate 1 potassium tablet PO. Criss Alvine, MD notified via secure chat.

## 2021-03-04 ENCOUNTER — Other Ambulatory Visit: Payer: Self-pay

## 2021-03-04 ENCOUNTER — Encounter: Payer: Medicare HMO | Attending: Surgery | Admitting: Skilled Nursing Facility1

## 2021-03-04 DIAGNOSIS — K219 Gastro-esophageal reflux disease without esophagitis: Secondary | ICD-10-CM | POA: Diagnosis not present

## 2021-03-04 DIAGNOSIS — Z9884 Bariatric surgery status: Secondary | ICD-10-CM | POA: Insufficient documentation

## 2021-03-04 DIAGNOSIS — Z713 Dietary counseling and surveillance: Secondary | ICD-10-CM | POA: Insufficient documentation

## 2021-03-04 DIAGNOSIS — K581 Irritable bowel syndrome with constipation: Secondary | ICD-10-CM | POA: Insufficient documentation

## 2021-03-04 DIAGNOSIS — E669 Obesity, unspecified: Secondary | ICD-10-CM

## 2021-03-04 NOTE — Progress Notes (Signed)
Bariatric Nutrition Follow-Up Visit Medical Nutrition Therapy   2 Months Post-Operative sleeve Surgery  NUTRITION ASSESSMENT    Anthropometrics  Surgery date: 01/07/2021 Surgery type: Sleeve Start weight at [facility]: 281.7 Weight today: 197.3  Body Composition Scale 01/22/2021 03/04/2021  Current Body Weight 243.3 197.3  Total Body Fat % 46.6 40.1  Visceral Fat 17 12  Fat-Free Mass % 53.3 59.8   Total Body Water % 41.1 44.4  Muscle-Mass lbs 29.3 30.1  BMI 41.7 33.7  Body Fat Displacement           Torso  lbs 70.4 49         Left Leg  lbs 14 9.8         Right Leg  lbs 14 9.8         Left Arm  lbs 7 4.9         Right Arm   lbs 7 4.9   Clinical  Medical hx: GERD, IBS-C Medications: linzess Labs: K 3.3   Lifestyle & Dietary Hx  Pt state she thinks her reflux and therefore hospital stays were due to her anti reflux medications. Pt states she has heartburn every day and takes tums for it sometimes needing 2-3.  Pt reports she has been smoking: Dietitian educated pt on the health implications if she does not stop smoking.  Pt states she does not like chicken or tuna. Pt sates she wants to try plant based protein options.    Estimated daily fluid intake: unknown oz Estimated daily protein intake: 60 g Supplements: multi and calcium Current average weekly physical activity: ADl's  24-Hr Dietary Recall First Meal: canadian bacon + 1/2 cup decaf coffee Snack: pickles  Second Meal: ham and cheese Snack: sugar free jello Third Meal: [place] Snack:  Beverages: water  Post-Op Goals/ Signs/ Symptoms Using straws: no Drinking while eating: no Chewing/swallowing difficulties: no Changes in vision: no Changes to mood/headaches: no Hair loss/changes to skin/nails: no Difficulty focusing/concentrating: no Sweating: no Dizziness/lightheadedness: no Palpitations: no Carbonated/caffeinated beverages: no N/V/D/C/Gas: yes to all expect diarrhea  Abdominal pain: no Dumping  syndrome: no    NUTRITION DIAGNOSIS  Overweight/obesity (Antonito-3.3) related to past poor dietary habits and physical inactivity as evidenced by completed bariatric surgery and following dietary guidelines for continued weight loss and healthy nutrition status.     NUTRITION INTERVENTION Nutrition counseling (C-1) and education (E-2) to facilitate bariatric surgery goals, including: . Diet advancement to the next phase (phase 4) now including non starchy  . The importance of consuming adequate calories as well as certain nutrients daily due to the body's need for essential vitamins, minerals, and fats . The importance of daily physical activity and to reach a goal of at least 150 minutes of moderate to vigorous physical activity weekly (or as directed by their physician) due to benefits such as increased musculature and improved lab values . The importance of intuitive eating specifically learning hunger-satiety cues and understanding the importance of learning a new body: The importance of mindful eating to avoid grazing behaviors   Goals: -Continue to aim for a minimum of 64 fluid ounces 7 days a week with at least 30 ounces being plain water -Eat non-starchy vegetables 2 times a day 7 days a week -Start out with soft cooked vegetables today and tomorrow; if tolerated begin to eat raw vegetables or cooked including salads -Eat your 3 ounces of protein first then start in on your non-starchy vegetables; once you understand how much of your meal  leads to satisfaction and not full while still eating 3 ounces of protein and non-starchy vegetables you can eat them in any order  -Continue to aim for 30 minutes of activity at least 5 times a week -Do NOT cook with/add to your food: alfredo sauce, cheese sauce, barbeque sauce, ketchup, fat back, butter, bacon grease, grease, Crisco, OR SUGAR -choose plant based proteins listed on handout  Handouts Provided Include   Phase 4  Learning Style &  Readiness for Change Teaching method utilized: Visual & Auditory  Demonstrated degree of understanding via: Teach Back  Readiness Level: contemplative Barriers to learning/adherence to lifestyle change: smoking  RD's Notes for Next Visit . Assess adherence to pt chosen goals   MONITORING & EVALUATION Dietary intake, weekly physical activity, body weight  Next Steps Patient is to follow-up

## 2021-06-11 ENCOUNTER — Ambulatory Visit: Payer: Medicare HMO

## 2021-06-11 ENCOUNTER — Other Ambulatory Visit: Payer: Self-pay

## 2021-06-11 ENCOUNTER — Encounter: Payer: Medicare HMO | Attending: Surgery | Admitting: Skilled Nursing Facility1

## 2021-06-11 DIAGNOSIS — Z6832 Body mass index (BMI) 32.0-32.9, adult: Secondary | ICD-10-CM | POA: Diagnosis not present

## 2021-06-11 DIAGNOSIS — Z713 Dietary counseling and surveillance: Secondary | ICD-10-CM | POA: Diagnosis not present

## 2021-06-11 DIAGNOSIS — Z5189 Encounter for other specified aftercare: Secondary | ICD-10-CM | POA: Diagnosis not present

## 2021-06-11 DIAGNOSIS — E669 Obesity, unspecified: Secondary | ICD-10-CM

## 2021-06-11 DIAGNOSIS — Z9884 Bariatric surgery status: Secondary | ICD-10-CM | POA: Insufficient documentation

## 2021-06-12 NOTE — Progress Notes (Signed)
Follow-up visit:  Post-Operative sleeve Surgery  Medical Nutrition Therapy:  Appt start time: 6:00pm end time:  7:00pm  Primary concerns today: Post-operative Bariatric Surgery Nutrition Management 6 Month Post-Op Class   Anthropometrics  Surgery date: 01/07/2021 Surgery type: Sleeve Start weight at [facility]: 281.7 Weight today: 196.3  Body Composition Scale 01/22/2021 03/04/2021 06/11/2021  Current Body Weight 243.3 197.3 196.3  Total Body Fat % 46.6 40.1 39.1  Visceral Fat 17 12 11   Fat-Free Mass % 53.3 59.8 60.8   Total Body Water % 41.1 44.4 44.9  Muscle-Mass lbs 29.3 30.1 30.9  BMI 41.7 33.7 32.3  Body Fat Displacement            Torso  lbs 70.4 49 47.5         Left Leg  lbs 14 9.8 9.5         Right Leg  lbs 14 9.8 9.5         Left Arm  lbs 7 4.9 4.7         Right Arm   lbs 7 4.9 4.7      Information Reviewed/ Discussed During Appointment: -Review of composition scale numbers -Fluid requirements (64-100 ounces) -Protein requirements (60-80g) -Strategies for tolerating diet -Advancement of diet to include Starchy vegetables -Barriers to inclusion of new foods -Inclusion of appropriate multivitamin and calcium supplements  -Exercise recommendations   Fluid intake: adequate   Medications: See List Supplementation: appropriate   Using straws: no Drinking while eating: no Having you been chewing well: yes Chewing/swallowing difficulties: no Changes in vision: no Changes to mood/headaches: no Hair loss/Cahnges to skin/Changes to nails: no Any difficulty focusing or concentrating: no Sweating: no Dizziness/Lightheaded: no Palpitations: no  Carbonated beverages: no N/V/D/C/GAS: no Abdominal Pain: no Dumping syndrome: no  Recent physical activity:  ADL's  Progress Towards Goal(s):  In Progress Teaching method utilized: Visual & Auditory  Demonstrated degree of understanding via: Teach Back  Readiness Level: Action Barriers to learning/adherence to lifestyle  change: none identified  Handouts given during visit include: Phase V diet Progression  Goals Sheet The Benefits of Exercise are endless..... Support Group Topics   Teaching Method Utilized:  Visual Auditory Hands on  Demonstrated degree of understanding via:  Teach Back   Monitoring/Evaluation:  Dietary intake, exercise, and body weight. Follow up in 3 months for 9 month post-op visit.

## 2021-06-16 ENCOUNTER — Emergency Department (HOSPITAL_COMMUNITY)
Admission: EM | Admit: 2021-06-16 | Discharge: 2021-06-16 | Disposition: A | Discharge status: Home / Self Care | Payer: Medicare HMO | Attending: Emergency Medicine | Admitting: Emergency Medicine

## 2021-06-16 ENCOUNTER — Other Ambulatory Visit: Payer: Self-pay

## 2021-06-16 ENCOUNTER — Encounter (HOSPITAL_COMMUNITY): Payer: Self-pay

## 2021-06-16 DIAGNOSIS — J45909 Unspecified asthma, uncomplicated: Secondary | ICD-10-CM | POA: Insufficient documentation

## 2021-06-16 DIAGNOSIS — F17210 Nicotine dependence, cigarettes, uncomplicated: Secondary | ICD-10-CM | POA: Diagnosis not present

## 2021-06-16 DIAGNOSIS — J449 Chronic obstructive pulmonary disease, unspecified: Secondary | ICD-10-CM | POA: Insufficient documentation

## 2021-06-16 DIAGNOSIS — R42 Dizziness and giddiness: Secondary | ICD-10-CM | POA: Diagnosis present

## 2021-06-16 LAB — CBC
HCT: 39.4 % (ref 36.0–46.0)
Hemoglobin: 13.4 g/dL (ref 12.0–15.0)
MCH: 30.8 pg (ref 26.0–34.0)
MCHC: 34 g/dL (ref 30.0–36.0)
MCV: 90.6 fL (ref 80.0–100.0)
Platelets: 154 10*3/uL (ref 150–400)
RBC: 4.35 MIL/uL (ref 3.87–5.11)
RDW: 15.4 % (ref 11.5–15.5)
WBC: 7.5 10*3/uL (ref 4.0–10.5)
nRBC: 0 % (ref 0.0–0.2)

## 2021-06-16 LAB — BASIC METABOLIC PANEL
Anion gap: 7 (ref 5–15)
BUN: 12 mg/dL (ref 6–20)
CO2: 27 mmol/L (ref 22–32)
Calcium: 10.3 mg/dL (ref 8.9–10.3)
Chloride: 107 mmol/L (ref 98–111)
Creatinine, Ser: 0.73 mg/dL (ref 0.44–1.00)
GFR, Estimated: >60 mL/min (ref >60)
Glucose, Bld: 89 mg/dL (ref 70–99)
Potassium: 3.9 mmol/L (ref 3.5–5.1)
Sodium: 141 mmol/L (ref 135–145)

## 2021-06-16 MED ORDER — CLOTRIMAZOLE-BETAMETHASONE 1-0.05 % EX CREA
TOPICAL_CREAM | CUTANEOUS | 0 refills | Status: DC
Start: 2021-06-16 — End: 2023-05-01

## 2021-06-16 MED ORDER — SODIUM CHLORIDE 0.9 % IV BOLUS: 500.0000 mL | Freq: Once | INTRAVENOUS | Status: DC

## 2021-06-16 NOTE — ED Provider Notes (Signed)
[facility]-EMERGENCY DEPT Provider Note   CSN: [id number] Arrival date & time: 06/16/21  1804     History Chief Complaint  Patient presents with   Dizziness    Brittney Sanford is a 55 y.o. female.  HPI 55 yo female history of vertigo presents today complaining of vertigo.  She states she has had increased vertigo for the past 4 weeks.  She was seen by her pcp and given medications which she reports did not work.  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     Family History  Problem Relation Age of Onset   COPD Mother    Emphysema Mother    Hypertension Father     Social History   Tobacco Use   Smoking status: Light Smoker    Packs/day: 1.00    Types: Cigarettes    Last attempt to quit: 10/16/2019    Years  since quitting: 1.6   Smokeless tobacco: Never  Vaping Use   Vaping Use: Never used  Substance Use Topics   Alcohol use: No   Drug use: No    Frequency: 2.0 times per week    Home Medications Prior to Admission medications   Medication Sig Start Date End Date Taking? Authorizing Provider  albuterol (PROVENTIL) (2.5 MG/3ML) 0.083% nebulizer solution Take 2.5 mg by nebulization 4 (four) times daily as needed for wheezing or shortness of breath.    [provider]  albuterol (VENTOLIN HFA) 108 (90 Base) MCG/ACT inhaler Inhale 2 puffs into the lungs every 6 (six) hours as needed for wheezing or shortness of breath.    [provider]  ALPRAZolam Prudy Feeler) 0.5 MG tablet Take 0.5 mg by mouth 2 (two) times daily as needed for anxiety. 07/09/20   [provider]  Calcium Citrate-Vitamin D (CELEBRATE CALCIUM PLUS 500) 500-333 MG-UNIT CHEW Chew 1 tablet by mouth in the morning, at noon, in the evening, and at bedtime.    [provider]  cyclobenzaprine (FLEXERIL) 10 MG tablet Take 10 mg by mouth 3 (three) times daily as needed for muscle spasms.    [provider]  DEXILANT 60 MG capsule Take 60 mg by mouth daily. 11/04/20   [provider]  gabapentin (NEURONTIN) 300 MG capsule Take 300 mg by mouth 3 (three) times daily as needed (nerve pain).    [provider]  LATUDA 40 MG TABS tablet Take 40 mg by mouth daily. 11/14/20   [provider]  LINZESS 290 MCG CAPS capsule Take 290 mcg by mouth daily. 09/18/20   [provider]  Multiple Vitamins-Minerals (BARIATRIC MULTIVITAMINS/IRON PO) Take 1 tablet by mouth daily. W/45 mg IRON    [provider]  ondansetron (ZOFRAN-ODT) 4 MG disintegrating tablet DISSOLVE 1 TABLET BY MOUTH EVERY 6 HOURS AS NEEDED FOR NAUSEA OR VOMITING. Patient not taking: No sig reported 01/10/21 01/10/22  Luretha Murphy, MD  oxyCODONE (OXY IR/ROXICODONE) 5 MG immediate release tablet TAKE 1  TABLET BY MOUTH EVERY 6 HOURS AS NEEDED FOR SEVERE PAIN Patient not taking: No sig reported 01/10/21 07/09/21  Luretha Murphy, MD  oxyCODONE-acetaminophen (PERCOCET) 10-325 MG tablet Take 1 tablet by mouth every 4 (four) hours as needed for pain.    [provider]  SUMAtriptan (IMITREX) 100 MG tablet Take 100 mg by mouth every 2 (two) hours as needed for migraine. 10/08/20   [provider]    Allergies    Lithium and Aspirin  Review of Systems   Review of Systems  All other systems reviewed and are negative.  Physical Exam Updated Vital Signs BP 130/90 (BP Location: Left Arm)   Pulse 66   Temp 98.4 F (36.9 C) (Oral)   Resp 16   SpO2 99%   Physical Exam Vitals and nursing note reviewed.  Constitutional:      General: She is not in acute distress.    Appearance: She is well-developed.  HENT:     Head: Normocephalic and atraumatic.     Right Ear: External ear normal.     Left Ear: External ear normal.     Nose: Nose normal.  Eyes:     Conjunctiva/sclera: Conjunctivae normal.     Pupils: Pupils are equal, round, and reactive to light.  Cardiovascular:     Rate and Rhythm: Normal rate and regular rhythm.  Pulmonary:     Effort: Pulmonary effort is normal.  Abdominal:     General: Abdomen is flat.     Palpations: Abdomen is soft.  Musculoskeletal:        General: Normal range of motion.     Cervical back: Normal range of motion and neck supple.  Skin:    General: Skin is warm and dry.     Capillary Refill: Capillary refill takes less than 2 seconds.  Neurological:     General: No focal deficit present.     Mental Status: She is alert and oriented to person, place, and time. Mental status is at baseline.     Cranial Nerves: No cranial nerve deficit.     Sensory: No sensory deficit.     Motor: No weakness or abnormal muscle tone.     Coordination: Coordination normal.  Psychiatric:        Behavior: Behavior normal.        Thought Content: Thought  content normal.    ED Results / Procedures / Treatments   Labs (all labs ordered are listed, but only abnormal results are displayed) Labs Reviewed - No data to display  EKG None  Radiology No results found.  Procedures Procedures   Medications Ordered in ED Medications -  No data to display  ED Course  I have reviewed the triage vital signs and the nursing notes.  Pertinent labs & imaging results that were available during my care of the patient were reviewed by me and considered in my medical decision making (see chart for details).    MDM Rules/Calculators/A&P                          Patient with known history of vertigo, no focal deficits on exam.  Here she has had labs checked and CBC and chemistries within normal limits.  She appears stable for discharge.  Discussed return precautions and need for follow-up and she voiced understanding. Final Clinical Impression(s) / ED Diagnoses Final diagnoses:  Vertigo    Rx / DC Orders ED Discharge Orders     None        Margarita Grizzle, MD 06/16/21 Corky [employer]s

## 2021-06-16 NOTE — ED Triage Notes (Signed)
Pt reports dizziness x1 month. Pt denies headache, nausea, and blurred vision. Pt denies taking any new medications. Pt reports hx of vertigo.

## 2021-06-16 NOTE — ED Notes (Signed)
Patient stated to tech that if the blood work if fine, she does not want to be re-stuck for an IV for fluids, she will go home and drink water. MD aware.

## 2021-07-01 ENCOUNTER — Ambulatory Visit: Payer: Medicare HMO | Admitting: Skilled Nursing Facility1

## 2021-07-27 ENCOUNTER — Emergency Department (HOSPITAL_COMMUNITY)
Admission: EM | Admit: 2021-07-27 | Discharge: 2021-07-27 | Disposition: A | Discharge status: Home / Self Care | Payer: Medicare HMO | Attending: Emergency Medicine | Admitting: Emergency Medicine

## 2021-07-27 ENCOUNTER — Encounter (HOSPITAL_COMMUNITY): Payer: Self-pay

## 2021-07-27 ENCOUNTER — Other Ambulatory Visit: Payer: Self-pay

## 2021-07-27 DIAGNOSIS — R222 Localized swelling, mass and lump, trunk: Secondary | ICD-10-CM | POA: Diagnosis present

## 2021-07-27 DIAGNOSIS — F17210 Nicotine dependence, cigarettes, uncomplicated: Secondary | ICD-10-CM | POA: Insufficient documentation

## 2021-07-27 DIAGNOSIS — R590 Localized enlarged lymph nodes: Secondary | ICD-10-CM | POA: Insufficient documentation

## 2021-07-27 DIAGNOSIS — J449 Chronic obstructive pulmonary disease, unspecified: Secondary | ICD-10-CM | POA: Insufficient documentation

## 2021-07-27 NOTE — ED Provider Notes (Signed)
[facility]-EMERGENCY DEPT Provider Note   CSN: [id number] Arrival date & time: 07/27/21  1821     History Chief Complaint  Patient presents with   Abscess    Brittney Sanford is a 55 y.o. female who presents to the ED Today with complaint of gradual onset, constant, achy, "knot" to her left groin area that she noticed a couple of days ago. Pt reports it is painful to the touch as well as when she moves her legs a certain way. She has not taken anything specifically for the pain. She takes percocet daily for chronic pain however it has not been helping. She tried applying warm compresses to the area however denies relief. She did not notice any skin changes to the area and the knot has not grown in size. She is s/p hysterectomy and is not sexually active x 5 years. She denies vaginal discharge or pelvic pain specifically. No fevers, chills, abdominal pain, nausea, vomiting.   The history is provided by the patient and medical records.      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   Obesity 01/10/2020    Past Surgical History:  Procedure Laterality Date   ABDOMINAL HYSTERECTOMY     APPENDECTOMY     BACK SURGERY     CHOLECYSTECTOMY     LAPAROSCOPIC GASTRIC SLEEVE RESECTION N/A 01/07/2021   Procedure: LAPAROSCOPIC GASTRIC SLEEVE RESECTION AND HIATAL HERNIA REPAIR;  Surgeon: Luretha Murphy, MD;  Location: [facility];  Service: General;  Laterality: N/A;   OVARIAN CYST REMOVAL     UPPER GI ENDOSCOPY N/A 01/07/2021   Procedure: UPPER GI  ENDOSCOPY;  Surgeon: Luretha Murphy, MD;  Location: [facility];  Service: General;  Laterality: N/A;     OB History   No obstetric history on file.     Family History  Problem Relation Age of Onset   COPD Mother    Emphysema Mother    Hypertension Father     Social History   Tobacco Use   Smoking status: Light Smoker    Packs/day: 1.00    Types: Cigarettes    Last attempt to quit: 10/16/2019    Years since quitting: 1.7   Smokeless tobacco: Never  Vaping Use   Vaping Use: Never used  Substance Use Topics   Alcohol use: No   Drug use: No    Frequency: 2.0 times per week    Home Medications Prior to Admission medications   Medication Sig Start Date End Date Taking? Authorizing Provider  albuterol (PROVENTIL) (2.5 MG/3ML) 0.083% nebulizer solution Take 2.5 mg by nebulization 4 (four) times daily as needed for wheezing or shortness of breath.    [provider]  albuterol (VENTOLIN HFA) 108 (90 Base) MCG/ACT inhaler Inhale 2 puffs into the lungs every 6 (six) hours as needed for wheezing or shortness of breath.    [provider]  ALPRAZolam Prudy Feeler) 0.5 MG  tablet Take 0.5 mg by mouth 2 (two) times daily as needed for anxiety. 07/09/20   [provider]  Calcium Citrate-Vitamin D (CELEBRATE CALCIUM PLUS 500) 500-333 MG-UNIT CHEW Chew 1 tablet by mouth in the morning, at noon, in the evening, and at bedtime.    [provider]  clotrimazole-betamethasone (LOTRISONE) cream Apply to affected area 2 times daily prn 06/16/21   Margarita Grizzle, MD  cyclobenzaprine (FLEXERIL) 10 MG tablet Take 10 mg by mouth 3 (three) times daily as needed for muscle spasms.    [provider]  DEXILANT 60 MG capsule Take 60 mg by mouth daily. 11/04/20   [provider]  gabapentin (NEURONTIN) 300 MG capsule Take 300 mg by mouth 3 (three) times daily as needed (nerve pain).    [provider]  LATUDA 40 MG TABS tablet Take 40 mg by mouth daily.  11/14/20   [provider]  LINZESS 290 MCG CAPS capsule Take 290 mcg by mouth daily. 09/18/20   [provider]  Multiple Vitamins-Minerals (BARIATRIC MULTIVITAMINS/IRON PO) Take 1 tablet by mouth daily. W/45 mg IRON    [provider]  ondansetron (ZOFRAN-ODT) 4 MG disintegrating tablet DISSOLVE 1 TABLET BY MOUTH EVERY 6 HOURS AS NEEDED FOR NAUSEA OR VOMITING. Patient not taking: No sig reported 01/10/21 01/10/22  Luretha Murphy, MD  oxyCODONE-acetaminophen (PERCOCET) 10-325 MG tablet Take 1 tablet by mouth every 4 (four) hours as needed for pain.    [provider]  SUMAtriptan (IMITREX) 100 MG tablet Take 100 mg by mouth every 2 (two) hours as needed for migraine. 10/08/20   [provider]    Allergies    Lithium and Aspirin  Review of Systems   Review of Systems  Constitutional:  Negative for chills and fever.  Gastrointestinal:  Negative for abdominal pain, nausea and vomiting.  Genitourinary:  Negative for difficulty urinating, dysuria, frequency and pelvic pain.  Skin:        + painful knot to left groin  All other systems reviewed and are negative.  Physical Exam Updated Vital Signs BP 117/79   Pulse 73   Temp 98.2 F (36.8 C) (Oral)   Resp 16   SpO2 100%   Physical Exam Vitals and nursing note reviewed.  Constitutional:      Appearance: She is not ill-appearing or diaphoretic.  HENT:     Head: Normocephalic and atraumatic.  Eyes:     Conjunctiva/sclera: Conjunctivae normal.  Cardiovascular:     Rate and Rhythm: Normal rate and regular rhythm.     Pulses: Normal pulses.  Pulmonary:     Effort: Pulmonary effort is normal.     Breath sounds: Normal breath sounds. No wheezing, rhonchi or rales.  Abdominal:     Palpations: Abdomen is soft.     Tenderness: There is no abdominal tenderness. There is no guarding or rebound.  Genitourinary:    Comments: Chaperone present at bedside. 1 cm mobile mass noted to left groin area  without any skin changes including signs of folliculitis. No fluctuance.  Musculoskeletal:     Cervical back: Neck supple.  Skin:    General: Skin is warm and dry.  Neurological:     Mental Status: She is alert.    ED Results / Procedures / Treatments   Labs (all labs ordered are listed, but only abnormal results are displayed) Labs Reviewed - No data to display  EKG None  Radiology No results found.  Procedures Procedures   Medications  Ordered in ED Medications - No data to display  ED Course  I have reviewed the triage vital signs and the nursing notes.  Pertinent labs & imaging results that were available during my care of the patient were reviewed by me and considered in my medical decision making (see chart for details).    MDM Rules/Calculators/A&P                           55 year old female who presents to the ED today with complaint of a painful knot to her left groin area that she noticed a couple of days ago.  On arrival to the ED vitals are stable patient appears to be in no acute distress.  Chaperone present at bedside, she does have a 1 cm mobile mass to her left inguinal area that is tender to palpation.  There is no overlying skin changes at this time to suggest folliculitis or abscess however question possibility of early folliculitis? She denies any vaginal discharge or pelvic pain, is not sexually active for the past 5 years.  She denies any unintentional weight loss.  She is status post hysterectomy.  No other complaints at this time. Pt advised for cold compresses as she reports no improvement with warm compresses and Ibuprofen as needed for pain and OBGYN follow up. Pt in agreement with plan and stable for discharge home.   This note was prepared using Dragon voice recognition software and may include unintentional dictation errors due to the inherent limitations of voice recognition software.   Final Clinical Impression(s) / ED Diagnoses Final  diagnoses:  Lymphadenopathy, inguinal    Rx / DC Orders ED Discharge Orders     None        Discharge Instructions      Please follow up with your OBGYN regarding ED visit today  I would recommend Ibuprofen as needed for pain and cold compresses.   Return to the ED for any new/worsening symptoms        Tanda Rockers, Cordelia Poche 07/27/21 2011    Alvira Monday, MD 07/29/21 2154

## 2021-07-27 NOTE — ED Notes (Addendum)
Pt. Stated she needed to use the restroom, pt. Got verbally agitated with tech. Took pt. Back to room and noticed pt. Had left the department. PA made aware.

## 2021-07-27 NOTE — ED Notes (Signed)
This RN notified by NT that pt has left the department. Pt gone prior to this RN being able to review discharge summary. ED PA aware.

## 2021-07-27 NOTE — Discharge Instructions (Addendum)
Please follow up with your OBGYN regarding ED visit today  I would recommend Ibuprofen as needed for pain and cold compresses.   Return to the ED for any new/worsening symptoms

## 2021-07-27 NOTE — ED Triage Notes (Signed)
Pt reports noticing a "knot" in her groin area about 4 days ago that is causing her pain.

## 2021-09-09 ENCOUNTER — Ambulatory Visit: Payer: Medicare HMO | Admitting: Skilled Nursing Facility1

## 2021-10-29 ENCOUNTER — Encounter: Payer: Medicare HMO | Attending: Surgery | Admitting: Skilled Nursing Facility1

## 2021-10-29 ENCOUNTER — Other Ambulatory Visit: Payer: Self-pay

## 2021-10-29 DIAGNOSIS — K219 Gastro-esophageal reflux disease without esophagitis: Secondary | ICD-10-CM | POA: Insufficient documentation

## 2021-10-29 DIAGNOSIS — Z713 Dietary counseling and surveillance: Secondary | ICD-10-CM | POA: Insufficient documentation

## 2021-10-29 DIAGNOSIS — Z9884 Bariatric surgery status: Secondary | ICD-10-CM | POA: Diagnosis not present

## 2021-10-29 DIAGNOSIS — K581 Irritable bowel syndrome with constipation: Secondary | ICD-10-CM | POA: Insufficient documentation

## 2021-10-29 DIAGNOSIS — E669 Obesity, unspecified: Secondary | ICD-10-CM

## 2021-10-29 NOTE — Progress Notes (Signed)
Follow-up visit:  Post-Operative sleeve Surgery   Anthropometrics  Surgery date: 01/07/2021 Surgery type: Sleeve Start weight at [facility]: 281.7 Weight today: 172.1 pounds  Clinical  Medical hx: GERD, IBS-C Medications: linzess Labs:   Body Composition Scale 01/22/2021 03/04/2021 06/11/2021 10/29/2021  Current Body Weight 243.3 197.3 196.3 172.1  Total Body Fat % 46.6 40.1 39.1 35.9  Visceral Fat 17 12 11 10   Fat-Free Mass % 53.3 59.8 60.8 64   Total Body Water % 41.1 44.4 44.9 46.5  Muscle-Mass lbs 29.3 30.1 30.9 29.8  BMI 41.7 33.7 32.3 29.4  Body Fat Displacement             Torso  lbs 70.4 49 47.5 38.2         Left Leg  lbs 14 9.8 9.5 7.6         Right Leg  lbs 14 9.8 9.5 7.6         Left Arm  lbs 7 4.9 4.7 3.8         Right Arm   lbs 7 4.9 4.7 3.8    Pt states she is not eating enough of the right stuff stating she is snacking again such as chips. Pt sates she does not eat vegetables because she does not like them. Pt states she was low in vitamin D and folic acid so her doctor started her on supplementation. Pt states she was experiencing small dry patches but when starting the folic acid those went away.   24 hr recall: Breakfast: skipped Snack: Lunch: salad + cheese Snack: Dinner: fried chicken snack  Fluid intake: water 40 ounces   Medications: See List Supplementation: multi patch (advised pt get the appropriate multivitamin), folic acid, vitamin D  Using straws: no Drinking while eating: no Having you been chewing well: yes Chewing/swallowing difficulties: no Changes in vision: no Changes to mood/headaches: no Hair loss/Cahnges to skin/Changes to nails: no Any difficulty focusing or concentrating: no Sweating: no Dizziness/Lightheaded: no Palpitations: no  Carbonated beverages: no N/V/D/C/GAS: taking linzess Abdominal Pain: no Dumping syndrome: no  Recent physical activity:  treadmill and strengthening 3 days a week 30 minutes-60 minutes  Progress  Towards Goal(s):  In Progress Teaching method utilized: Visual & Auditory  Demonstrated degree of understanding via: Teach Back  Readiness Level: Action Barriers to learning/adherence to lifestyle change: none identified  Nutrition Education: Encouraged patient to honor their body's internal hunger and fullness cues.  Throughout the day, check in mentally and rate hunger. Stop eating when satisfied not full regardless of how much food is left on the plate.  Get more if still hungry 20-30 minutes later.  The key is to honor satisfaction so throughout the meal, rate fullness factor and stop when comfortably satisfied not physically full. The key is to honor hunger and fullness without any feelings of guilt or shame.  Pay attention to what the internal cues are, rather than any external factors. This will enhance the confidence you have in listening to your own body and following those internal cues enabling you to increase how often you eat when you are hungry not out of appetite and stop when you are satisfied not full.  Encouraged pt to continue to eat balanced meals inclusive of non starchy vegetables 2 times a day 7 days a week Encouraged pt to choose lean protein sources: limiting beef, pork, sausage, hotdogs, and lunch meat Encourage pt to choose healthy fats such as plant based limiting animal fats Encouraged pt to continue  to drink a minium 64 fluid ounces with half being plain water to satisfy proper hydration  Importance of vegetables To have an overall healthy diet, adult men and women are recommended to consume anywhere from 2-3 cups of vegetables daily. Vegetables provide a wide range of vitamins and minerals such as vitamin A, vitamin C, potassium, and folic acid. According to the [employer], including fruit and vegetables daily may reduce the risk of cardiovascular disease, certain cancers, and other non-communicable diseases. Why you need complex carbohydrates: Whole grains  and other complex carbohydrates are required to have a healthy diet. Whole grains provide fiber which can help with blood glucose levels and help keep you satiated. Fruits and starchy vegetables provide essential vitamins and minerals required for immune function, eyesight support, brain support, bone density, wound healing and many other functions within the body. According to the current evidenced based 2020-2025 Dietary Guidelines for Americans, complex carbohydrates are part of a healthy eating pattern which is associated with a decreased risk for type 2 diabetes, cancers, and cardiovascular disease.   Goals: Create balanced meals 7 days a week 3 meals a day  Teaching Method Utilized:  Visual Auditory Hands on  Demonstrated degree of understanding via:  Teach Back   Monitoring/Evaluation:  Dietary intake, exercise, and body weight. Follow up in 2 months

## 2021-11-12 ENCOUNTER — Ambulatory Visit: Payer: Medicare HMO | Admitting: Skilled Nursing Facility1

## 2021-12-17 ENCOUNTER — Encounter: Payer: Medicare HMO | Attending: Surgery | Admitting: Skilled Nursing Facility1

## 2021-12-17 ENCOUNTER — Other Ambulatory Visit: Payer: Self-pay

## 2021-12-17 DIAGNOSIS — Z6829 Body mass index (BMI) 29.0-29.9, adult: Secondary | ICD-10-CM | POA: Insufficient documentation

## 2021-12-17 DIAGNOSIS — E669 Obesity, unspecified: Secondary | ICD-10-CM | POA: Insufficient documentation

## 2021-12-17 DIAGNOSIS — Z713 Dietary counseling and surveillance: Secondary | ICD-10-CM | POA: Diagnosis not present

## 2021-12-17 NOTE — Progress Notes (Signed)
Follow-up visit:  Post-Operative sleeve Surgery   Anthropometrics  Surgery date: 01/07/2021 Surgery type: Sleeve Start weight at [facility]: 281.7 Weight today: 173.3 pounds  Clinical  Medical hx: GERD, IBS-C Medications: linzess Labs:   Body Composition Scale 01/22/2021 03/04/2021 06/11/2021 10/29/2021 12/17/2021  Current Body Weight 243.3 197.3 196.3 172.1 173.3  Total Body Fat % 46.6 40.1 39.1 35.9 36.1  Visceral Fat 17 12 11 10 10   Fat-Free Mass % 53.3 59.8 60.8 64 63.8   Total Body Water % 41.1 44.4 44.9 46.5 46.4  Muscle-Mass lbs 29.3 30.1 30.9 29.8 29.8  BMI 41.7 33.7 32.3 29.4 29.6  Body Fat Displacement              Torso  lbs 70.4 49 47.5 38.2 38.6         Left Leg  lbs 14 9.8 9.5 7.6 7.7         Right Leg  lbs 14 9.8 9.5 7.6 7.7         Left Arm  lbs 7 4.9 4.7 3.8 3.8         Right Arm   lbs 7 4.9 4.7 3.8 3.8    Pt states she hates her lose skin. Pt states her back will hurt sometimes getting in the way of her activity she wants to conduct.  Pt states she wants to get back into eating dinner. Pt states she is a picky eater.  Pt states she tried to get off her acid reducing medicine but needs to stay on it.  Pt states she wants to get into her 160's for weight.   24 hr recall: Breakfast: oatmeal + raisins Snack: sunflower seeds Lunch: salad: [place], tomato, lettuce, cheese Snack: sunflower Dinner: skipped snack  Fluid intake: water 40 ounces, decaf coffee + monk + half and half  Medications: See List Supplementation: multi patch (advised pt get the appropriate multivitamin), folic acid, vitamin D  Using straws: no Drinking while eating: no Having you been chewing well: yes Chewing/swallowing difficulties: no Changes in vision: no Changes to mood/headaches: no Hair loss/Cahnges to skin/Changes to nails: no Any difficulty focusing or concentrating: no Sweating: no Dizziness/Lightheaded: no Palpitations: no  Carbonated beverages: no N/V/D/C/GAS: taking  linzess Abdominal Pain: no Dumping syndrome: no  Recent physical activity:  treadmill and strengthening 3-4 days a week 30 (treadmill) minutes-60 (circuit for 30 minutes) minutes  Progress Towards Goal(s):  In Progress Teaching method utilized: Visual & Auditory  Demonstrated degree of understanding via: Teach Back  Readiness Level: Action Barriers to learning/adherence to lifestyle change: none identified  Nutrition Education: Encouraged patient to honor their body's internal hunger and fullness cues.  Throughout the day, check in mentally and rate hunger. Stop eating when satisfied not full regardless of how much food is left on the plate.  Get more if still hungry 20-30 minutes later.  The key is to honor satisfaction so throughout the meal, rate fullness factor and stop when comfortably satisfied not physically full. The key is to honor hunger and fullness without any feelings of guilt or shame.  Pay attention to what the internal cues are, rather than any external factors. This will enhance the confidence you have in listening to your own body and following those internal cues enabling you to increase how often you eat when you are hungry not out of appetite and stop when you are satisfied not full.  Encouraged pt to continue to eat balanced meals inclusive of non starchy vegetables 2 times  a day 7 days a week Encouraged pt to choose lean protein sources: limiting beef, pork, sausage, hotdogs, and lunch meat Encourage pt to choose healthy fats such as plant based limiting animal fats Encouraged pt to continue to drink a minium 64 fluid ounces with half being plain water to satisfy proper hydration  Importance of vegetables To have an overall healthy diet, adult men and women are recommended to consume anywhere from 2-3 cups of vegetables daily. Vegetables provide a wide range of vitamins and minerals such as vitamin A, vitamin C, potassium, and folic acid. According to the [employer], including fruit and vegetables daily may reduce the risk of cardiovascular disease, certain cancers, and other non-communicable diseases. Why you need complex carbohydrates: Whole grains and other complex carbohydrates are required to have a healthy diet. Whole grains provide fiber which can help with blood glucose levels and help keep you satiated. Fruits and starchy vegetables provide essential vitamins and minerals required for immune function, eyesight support, brain support, bone density, wound healing and many other functions within the body. According to the current evidenced based 2020-2025 Dietary Guidelines for Americans, complex carbohydrates are part of a healthy eating pattern which is associated with a decreased risk for type 2 diabetes, cancers, and cardiovascular disease.  Benefits of getting enough calcium: The body requires calcium to maintain healthy teeth and bones. For children and teens, their body is rapidly growing and requires calcium to continue proper growth. Bone mass can reach its peak in the mid to late 20s, after that it is a matter of maintaining that peak bone mass. Without enough calcium in the diet you are at an increased risk for developing bone disease and experiencing fractures due to weakened bones. Calcium is also incorporated in transmitting nerve impulses, allowing muscles to contract and clotting blood.  [employer], calcium for teens, why calcium is important from [place]  What happens when I dont eat?  Your body uses food as fuel to keep all the important organs and cells in your body running well. When you dont eat, your body doesnt get the fuel it needs and your organs and body parts can suffer.  The Heart & Circulation: Your heart is a muscle that can shrink and weaken when you dont eat. This can create circulation problems and an irregular heartbeat. Blood pressure can get very low during starvation and make you feel dizzy when you  stand up.  The Stomach: Your stomach becomes smaller when you dont eat so when you start eating again, your stomach is likely to feel uncomfortable (you may have stomach aches and/or gas). Also, your stomach will not empty as fast making you feel full longer.  The Intestines: Your intestines will move food slowly often resulting in constipation (trouble having a bowel movement) and/or stomach aches or cramps when you eat meals.  The Brain: Your brain, which controls the rest of your bodys functions, does not work properly without food. For example, you may have trouble thinking clearly or paying attention and/or you could also feel anxious or sad.  Body Cells: The balance of electrolytes in the blood can be changed with malnutrition or with purging. Without food, the amount of potassium and phosphorous can get dangerously low which can cause problems with your muscles, changes in your brain functioning, and cause life-threatening heart and rhythm problems.  Bones: When you dont eat, your bones often become weak due to low calcium and low hormone levels, which increases your risk of  getting broken bones now and developing weak bones when youre older.  Body Temperature: Your body naturally lowers its temperature in times of starvation to conserve energy and protect vital organs. When this happens, there is a decrease in circulation (blood flow) to your fingers and toes which will often cause your hands and feet to feel cold and look bluish.  Skin: Your skin becomes dry when your body is not well hydrated and when it does not get enough vitamins and minerals from food. The skin will naturally protect your body during periods of starvation by developing fine, soft hair called lanugo that covers the skin to keep your body warm.  Hair: When your hair doesnt get enough nourishment from the vitamins and minerals that are naturally found in healthy food, it becomes dry, thin and it can even fall out.  Nails:  Your nails require nutrients in the form of vitamins and minerals from your diet. When you dont eat, you deny your body what it needs and your nails become dry and brittle and break easily.  Teeth: Your teeth need vitamin D and calcium from food sources. Without both of these minerals, you can end up with dental problems such as tooth decay and gum disease. Purging can also destroy tooth enamel.   Teaching Method Utilized:  Visual Auditory Hands on  Demonstrated degree of understanding via:  Teach Back   Monitoring/Evaluation:  Dietary intake, exercise, and body weight.

## 2022-01-21 ENCOUNTER — Other Ambulatory Visit: Payer: Self-pay | Admitting: Internal Medicine

## 2022-01-21 DIAGNOSIS — Z1231 Encounter for screening mammogram for malignant neoplasm of breast: Secondary | ICD-10-CM

## 2022-04-12 LAB — COLOGUARD: COLOGUARD: NEGATIVE

## 2022-04-15 ENCOUNTER — Encounter: Payer: Self-pay | Admitting: Skilled Nursing Facility1

## 2022-04-15 ENCOUNTER — Encounter: Payer: Medicare HMO | Attending: Surgery | Admitting: Skilled Nursing Facility1

## 2022-04-15 DIAGNOSIS — Z713 Dietary counseling and surveillance: Secondary | ICD-10-CM | POA: Insufficient documentation

## 2022-04-15 DIAGNOSIS — Z6829 Body mass index (BMI) 29.0-29.9, adult: Secondary | ICD-10-CM | POA: Diagnosis not present

## 2022-04-15 DIAGNOSIS — E669 Obesity, unspecified: Secondary | ICD-10-CM | POA: Diagnosis not present

## 2022-04-15 NOTE — Progress Notes (Signed)
Follow-up visit:  Post-Operative sleeve Surgery ? ? ?Anthropometrics  ?Surgery date: 01/07/2021 ?Surgery type: Sleeve ?Start weight at [facility]: 281.7 ?Weight today: 172.3 pounds ? ?Clinical  ?Medical hx: GERD, IBS-C ?Medications: linzess ?Labs:  ? ?Body Composition Scale 01/22/2021 03/04/2021 06/11/2021 10/29/2021 12/17/2021 04/15/2022  ?Current Body Weight 243.3 197.3 196.3 172.1 173.3 172.3  ?Total Body Fat % 46.6 40.1 39.1 35.9 36.1 36.1  ?Visceral Fat ?Fat-Free Mass % 53.3 59.8 60.8 64 63.8 63.8  ? Total Body Water % 41.1 44.4 44.9 46.5 46.4 46.4  ?Muscle-Mass lbs 29.3 30.1 30.9 29.8 29.8 29.6  ?BMI 41.7 33.7 32.3 29.4 29.6 29.4  ?Body Fat Displacement        ?       Torso  lbs 70.4 49 47.5 38.2 38.6 38.4  ?       Left Leg  lbs 14 9.8 9.5 7.6 7.7 7.6  ?       Right Leg  lbs 14 9.8 9.5 7.6 7.7 7.6  ?       Left Arm  lbs 7 4.9 4.7 3.8 3.8 3.8  ?       Right Arm   lbs 7 4.9 4.7 3.8 3.8 3.8  ? ? ? ?Pt state she could not eat her veggie so she purees them with fruit.  ?Pt states her back is in pain but is active since getting better ?Pt states she is still smoking  ? ?24 hr recall: ?Breakfast 8am: veggie and fruit smoothie + powder  ?Snack: water ?Lunch 1pm: veggie and fruit smoothie + protein powder ?Snack: p3 ?Dinner: baked chicken  ?snack ? ?Fluid intake: water 65 ounces, decaf coffee + monk + half and half, sugar free Snapple ? ?Medications: See List ?Supplementation: multi patch (advised pt get the appropriate multivitamin), folic acid, vitamin D ? ?Using straws: no ?Drinking while eating: no ?Having you been chewing well: yes ?Chewing/swallowing difficulties: no ?Changes in vision: no ?Changes to mood/headaches: no ?Hair loss/Cahnges to skin/Changes to nails: no ?Any difficulty focusing or concentrating: no ?Sweating: no ?Dizziness/Lightheaded: no ?Palpitations: no  ?Carbonated beverages: no ?N/V/D/C/GAS: taking linzess (but better since having the smoothie) ?Abdominal Pain: no ?Dumping  syndrome: no ? ?Recent physical activity: boot camp 2 times a week and walking 3 days a week 45 minutes  ? ?Progress Towards Goal(s):  In Progress ?Teaching method utilized: Visual & Auditory  ?Demonstrated degree of understanding via: Teach Back  ?Readiness Level: Action ?Barriers to learning/adherence to lifestyle change: none identified ? ?Nutrition Education: ?Encouraged patient to honor their body's internal hunger and fullness cues.  Throughout the day, check in mentally and rate hunger. Stop eating when satisfied not full regardless of how much food is left on the plate.  Get more if still hungry 20-30 minutes later.  The key is to honor satisfaction so throughout the meal, rate fullness factor and stop when comfortably satisfied not physically full. The key is to honor hunger and fullness without any feelings of guilt or shame.  Pay attention to what the internal cues are, rather than any external factors. This will enhance the confidence you have in listening to your own body and following those internal cues enabling you to increase how often you eat when you are hungry not out of appetite and stop when you are satisfied not full.  ?Encouraged pt to continue to eat balanced meals inclusive of non starchy vegetables 2 times a day 7 days a week ?Encouraged  pt to choose lean protein sources: limiting beef, pork, sausage, hotdogs, and lunch meat ?Encourage pt to choose healthy fats such as plant based limiting animal fats ?Encouraged pt to continue to drink a minium 64 fluid ounces with half being plain water to satisfy proper hydration  ?Importance of vegetables ?To have an overall healthy diet, adult men and women are recommended to consume anywhere from 2-3 cups of vegetables daily. Vegetables provide a wide range of vitamins and minerals such as vitamin A, vitamin C, potassium, and folic acid. According to the [employer], including fruit and vegetables daily may reduce the risk of  cardiovascular disease, certain cancers, and other non-communicable diseases. ?Why you need complex carbohydrates: Whole grains and other complex carbohydrates are required to have a healthy diet. Whole grains provide fiber which can help with blood glucose levels and help keep you satiated. Fruits and starchy vegetables provide essential vitamins and minerals required for immune function, eyesight support, brain support, bone density, wound healing and many other functions within the body. According to the current evidenced based 2020-2025 Dietary Guidelines for Americans, complex carbohydrates are part of a healthy eating pattern which is associated with a decreased risk for type 2 diabetes, cancers, and cardiovascular disease.  ?Benefits of getting enough calcium: The body requires calcium to maintain healthy teeth and bones. For children and teens, their body is rapidly growing and requires calcium to continue proper growth. Bone mass can reach its peak in the mid to late 20s, after that it is a matter of maintaining that peak bone mass. Without enough calcium in the diet you are at an increased risk for developing bone disease and experiencing fractures due to weakened bones. Calcium is also incorporated in transmitting nerve impulses, allowing muscles to contract and clotting blood.  ?[employer]Medlineplus.gov, calcium for teens, why calcium is important from [facility] ? ?What happens when I don?t eat?  ?Your body uses food as fuel to keep all the important organs and cells in your body running well. When you don?t eat, your body doesn?t get the fuel it needs and your organs and body parts can suffer.  ?The Heart & Circulation: Your heart is a muscle that can shrink and weaken when you don?t eat. This can create circulation problems and an irregular heartbeat. Blood pressure can get very low during starvation and make you feel dizzy when you stand up.  ?The Stomach: Your stomach becomes smaller when you don?t eat so  when you start eating again, your stomach is likely to feel uncomfortable (you may have stomach aches and/or gas). Also, your stomach will not empty as fast making you feel full longer.  ?The Intestines: Your intestines will move food slowly often resulting in constipation (trouble having a bowel movement) and/or stomach aches or cramps when you eat meals.  ?The Brain: Your brain, which controls the rest of your body?s functions, does not work properly without food. For example, you may have trouble thinking clearly or paying attention and/or you could also feel anxious or sad.  ?Body Cells: The balance of electrolytes in the blood can be changed with malnutrition or with purging. Without food, the amount of potassium and phosphorous can get dangerously low which can cause problems with your muscles, changes in your brain functioning, and cause life-threatening heart and rhythm problems.  ?Bones: When you don?t eat, your bones often become weak due to low calcium and low hormone levels, which increases your risk of getting broken bones now and developing weak  bones when you?re older.  ?Body Temperature: Your body naturally lowers its temperature in times of starvation to conserve energy and protect vital organs. When this happens, there is a decrease in circulation (blood flow) to your fingers and toes which will often cause your hands and feet to feel cold and look bluish.  ?Skin: Your skin becomes dry when your body is not well hydrated and when it does not get enough vitamins and minerals from food. The skin will naturally protect your body during periods of starvation by developing fine, soft hair called ?lanugo? that covers the skin to keep your body warm.  ?Hair: When your hair doesn?t get enough nourishment from the vitamins and minerals that are naturally found in healthy food, it becomes dry, thin and it can even fall out.  ?Nails: Your nails require nutrients in the form of vitamins and minerals from your  diet. When you don?t eat, you deny your body what it needs and your nails become dry and brittle and break easily.  ?Teeth: Your teeth need vitamin D and calcium from food sources. Without both of these minerals, you can

## 2022-05-19 IMAGING — CT CT ABD-PELV W/ CM
2 of 5 series · 15 of 46 positions shown, 17 images · IV contrast (omnipaque)
Comparison: None.

CLINICAL DATA: Abdominal abscess/infection suspected

Gastric sleeve 01/07/2021.  Abdominal pain and vomiting.
EXAM:
CT ABDOMEN AND PELVIS WITH CONTRAST
TECHNIQUE: Multidetector CT imaging of the abdomen and pelvis was performed
using the standard protocol following bolus administration of
intravenous contrast.
CONTRAST:  100mL OMNIPAQUE IOHEXOL 300 MG/ML  SOLN

[Series 2: axial st · axial · 0.84mm/px · z∈[+1116,+1506]mm · 12 of 91 slices shown, 14 images]
[im 7/91  soft-tissue]
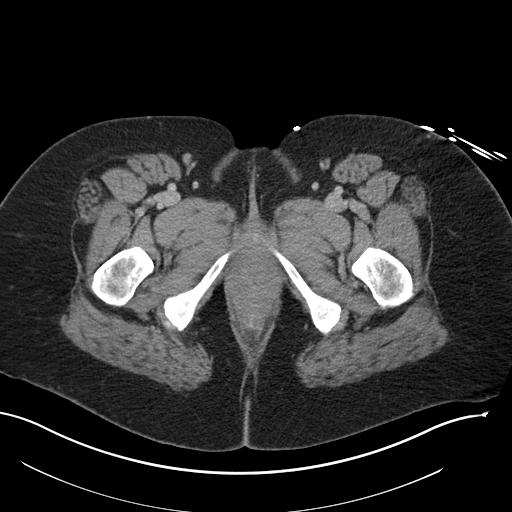
[im 7/91  bone]
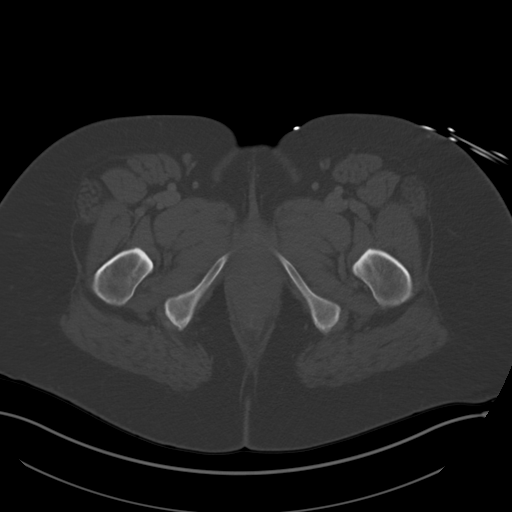
[im 13/91  soft-tissue]
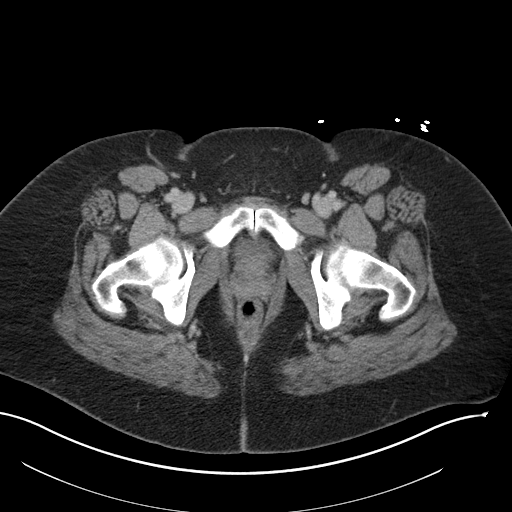
[im 19/91  soft-tissue]
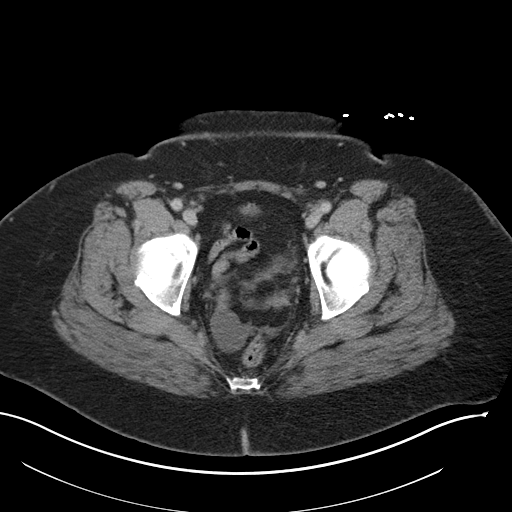
[im 31/91  soft-tissue]
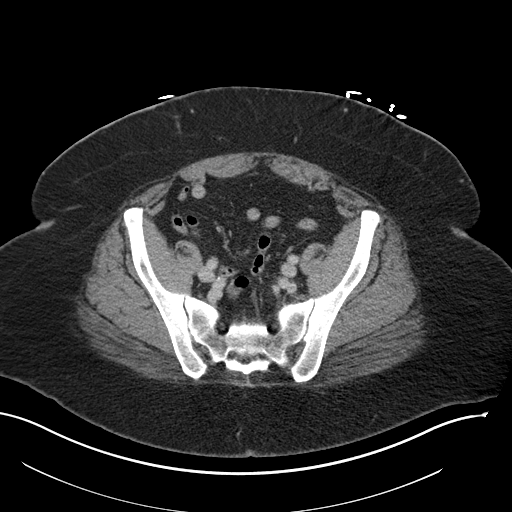
[im 37/91  soft-tissue]
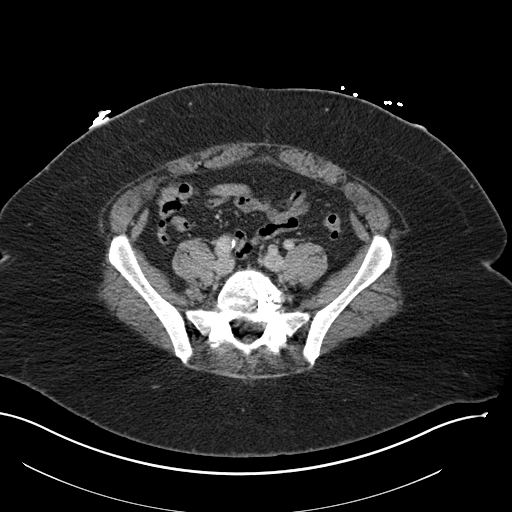
[im 43/91  soft-tissue]
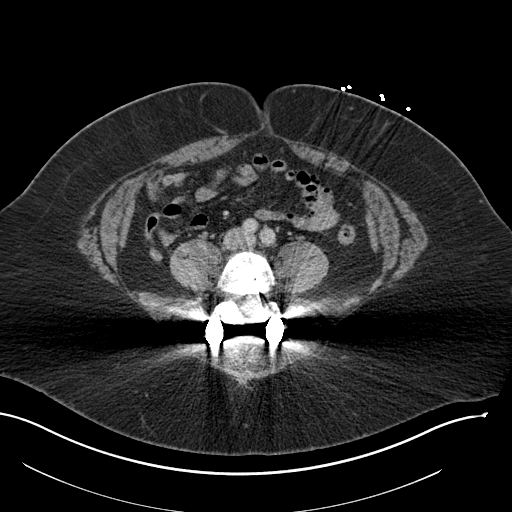
[im 49/91  soft-tissue]
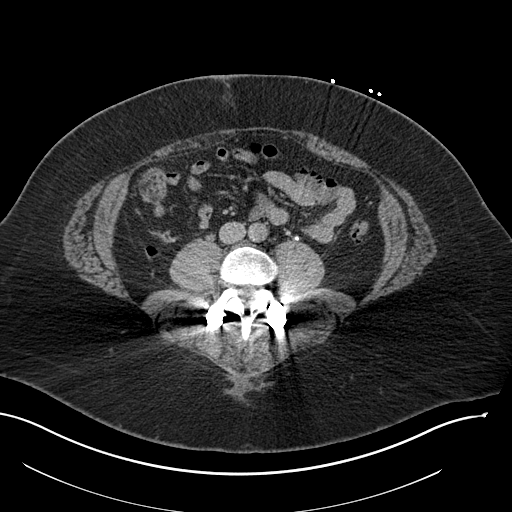
[im 55/91  soft-tissue]
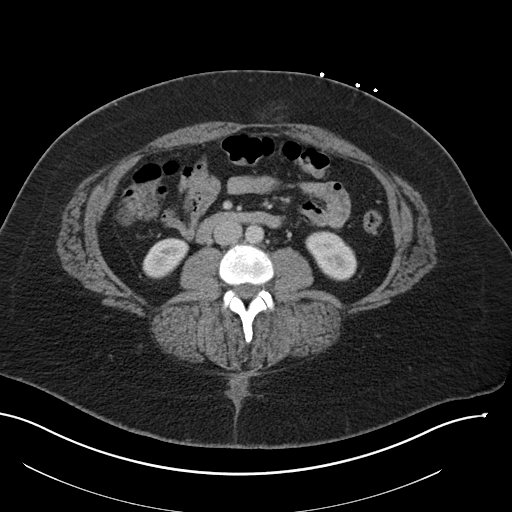
[im 61/91  soft-tissue]
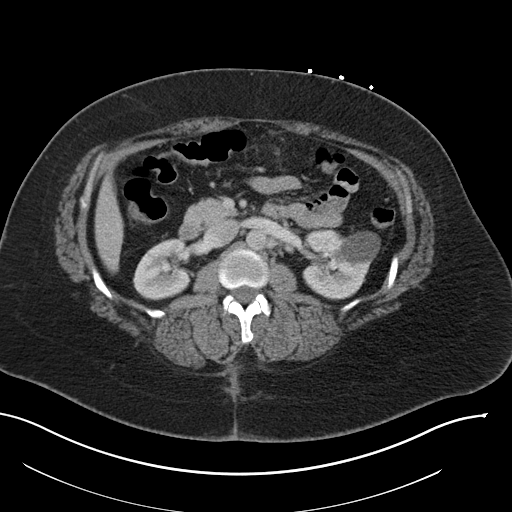
[im 61/91  bone]
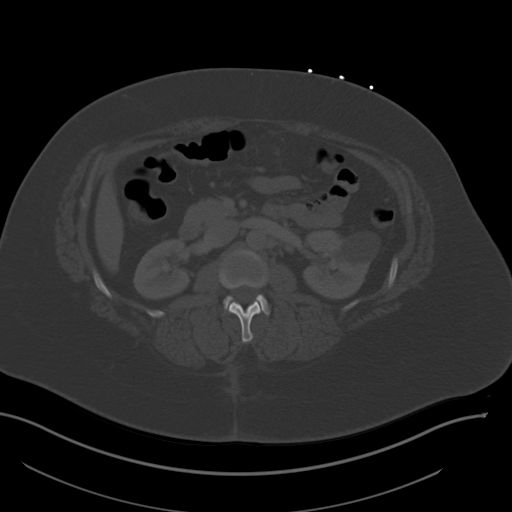
[im 73/91  soft-tissue]
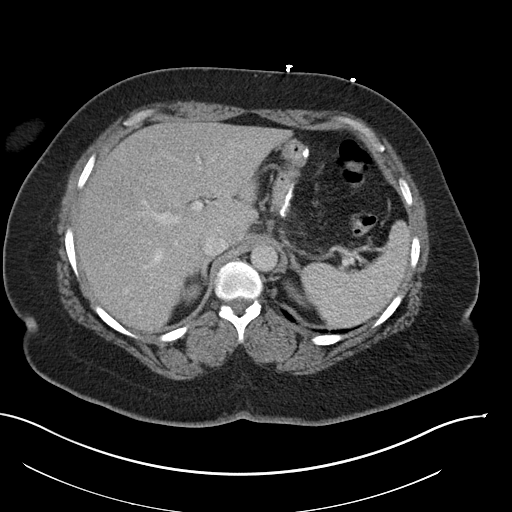
[im 79/91  soft-tissue]
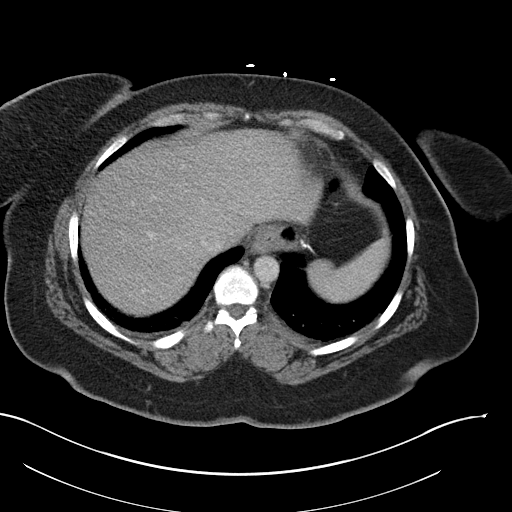
[im 85/91  soft-tissue]
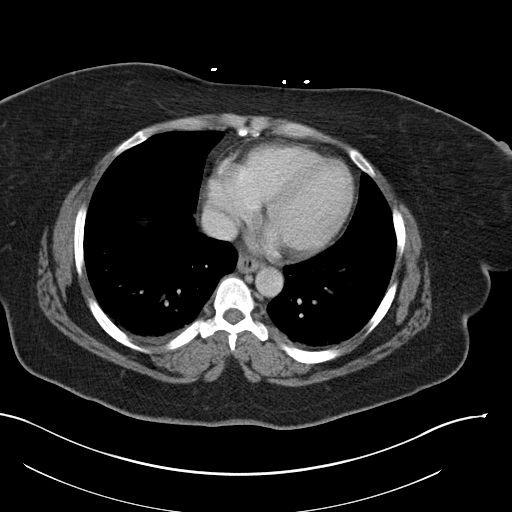

[Series 4: coronal st · coronal · 0.86mm/px · 3 of 151 slices shown]
[im 51/151  soft-tissue]
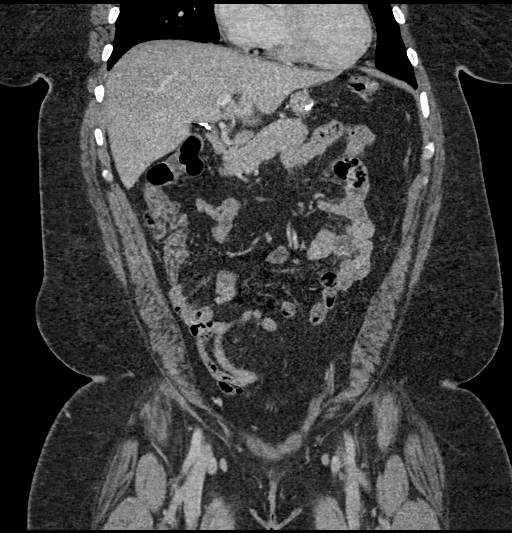
[im 67/151  soft-tissue]
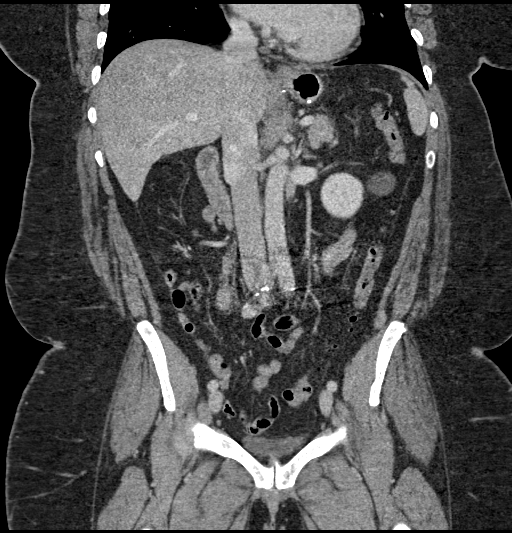
[im 84/151  soft-tissue]
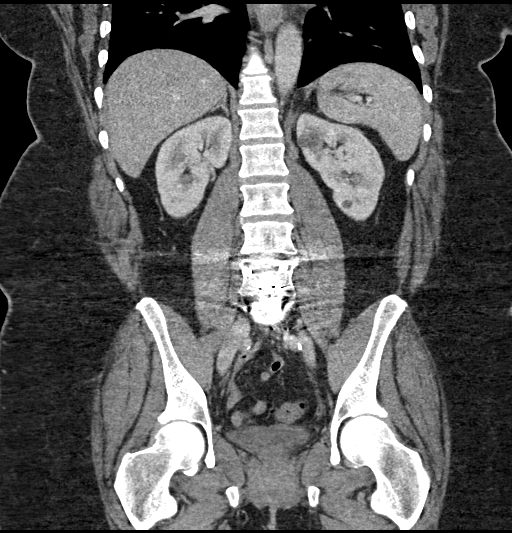

[15 of 46 positions shown; findings below may reference images not displayed]

FINDINGS: Lower chest: Hypoventilatory atelectasis in the lung bases. Trace
right pleural effusion. 5 mm subpleural nodule in the right lower
lobe, series 6, image 2.

Hepatobiliary: Decreased density adjacent to the falciform ligament
and gallbladder fossa consistent with focal fatty infiltration.
Minimal background hepatic steatosis. Cholecystectomy without
biliary dilatation.

Pancreas: Unremarkable. No pancreatic ductal dilatation or
surrounding inflammatory changes.

Spleen: Normal in size without focal abnormality.

Adrenals/Urinary Tract: Normal adrenal glands. No hydronephrosis or
perinephric edema. Homogeneous renal enhancement with symmetric
excretion on delayed phase imaging. Multiple low-density lesions in
the left kidney, largest measuring 4.2 cm and consistent with simple
cyst. Many of the smaller lesions are too small to accurately
characterize. Urinary bladder is partially distended without wall
thickening.

Stomach/Bowel: Gastric suture line consistent with gastric sleeve.
Tiny hiatal hernia. There is no gastric wall thickening. Normal
positioning and appearance of the duodenum. Decompressed small bowel
without obstruction or inflammation. Appendectomy with surgical
staple line at the bases cecum. Occasional colonic diverticula in
the descending and sigmoid colon. No diverticulitis. No colonic wall
thickening or pericolonic edema.

Vascular/Lymphatic: Mild aortic atherosclerosis. No aortic aneurysm.
Patent portal vein. No mesenteric gas. Small retroperitoneal lymph
nodes are not enlarged by size criteria. No pelvic adenopathy.

Reproductive: Hysterectomy. 3.6 cm multi circumscribed low
attenuating cystic lesion in the right pelvis abutting the vaginal
cuff is unchanged from prior exam may represent a peritoneal
inclusion cyst. No suspicious adnexal mass.

Other: No intra-abdominal abscess. No free air or ascites. There is
a small fat containing supraumbilical ventral abdominal wall hernia
with hernia neck measuring 2 cm transverse. No inflammation or bowel
involvement.

Musculoskeletal: Posterior L4-S1 fusion. Mild degenerative change of
the pubic symphysis. There are no acute or suspicious osseous
abnormalities.
IMPRESSION: 1. No acute abnormality or explanation for abdominal pain.
2. Post gastric sleeve without complication.  No abscess.
3. Minimal colonic diverticulosis without diverticulitis.
4. Small fat containing upper abdominal ventral wall hernia.
5. Trace right pleural effusion.
6. Right lower lobe 5 mm pulmonary nodule is not significantly
changed from 0708 CT and considered benign.

Aortic Atherosclerosis ([id number]).

## 2022-06-10 IMAGING — CR DG ABDOMEN ACUTE W/ 1V CHEST
4 series · 4 of 4 positions shown · non-contrast
Comparison: 02/11/2021

CLINICAL DATA: Vomiting, recent bariatric surgery

EXAM:
DG ABDOMEN ACUTE WITH 1 VIEW CHEST

[w chest pa]
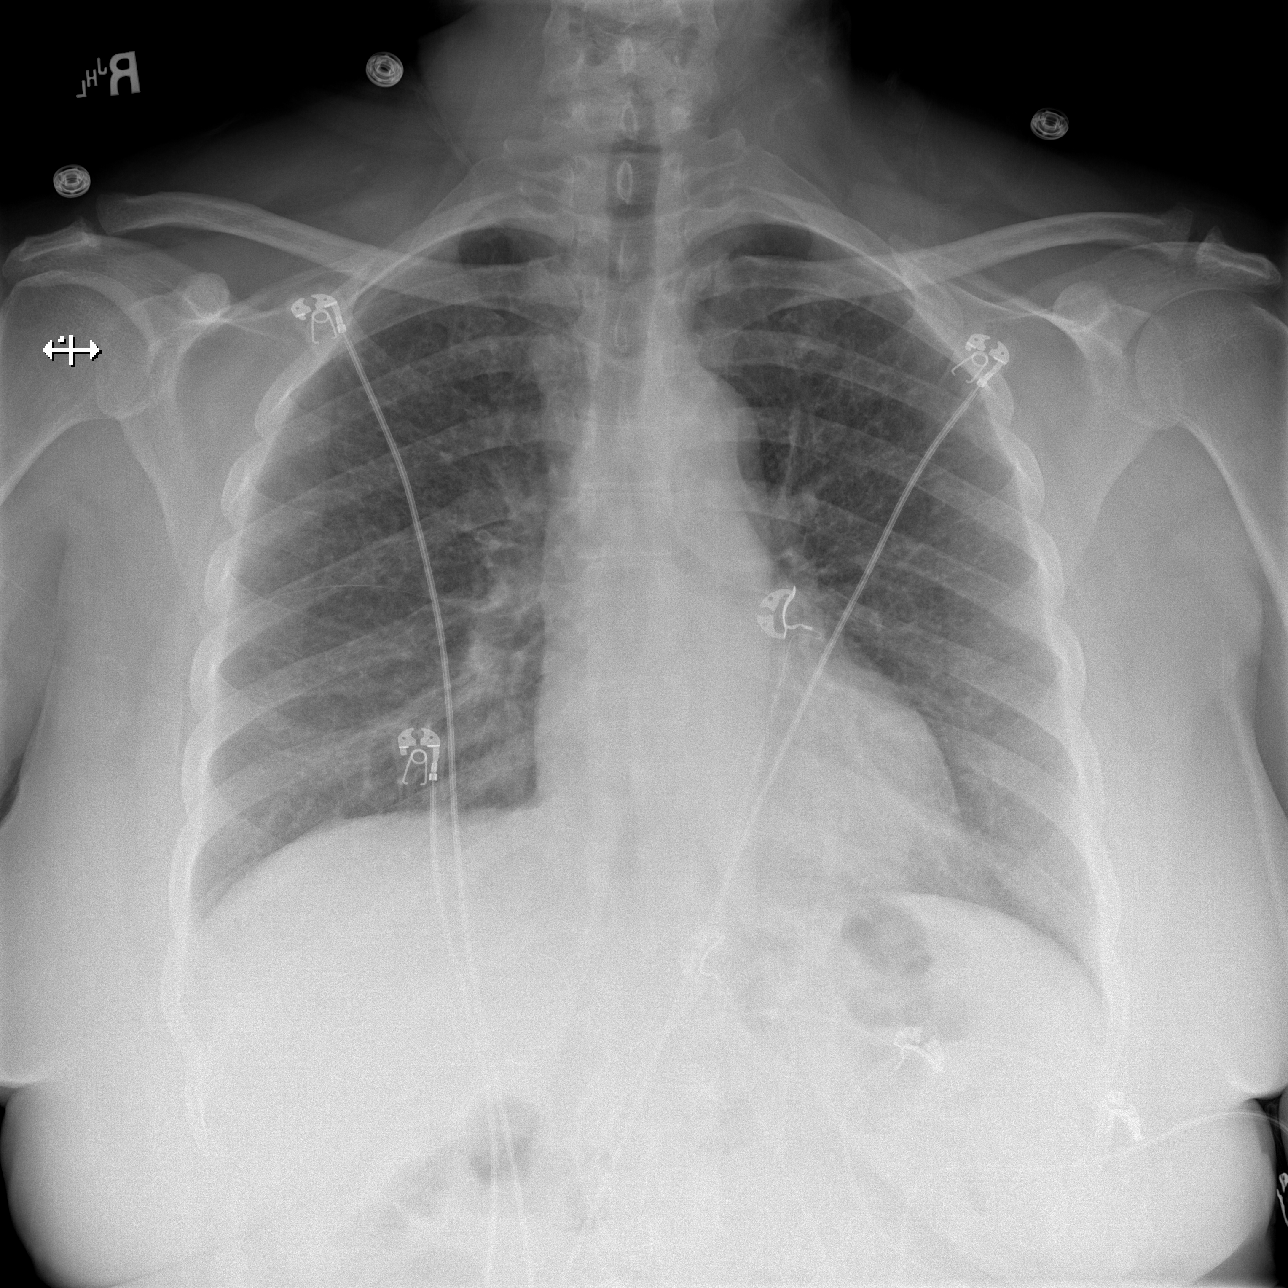

[w abdomen upright]
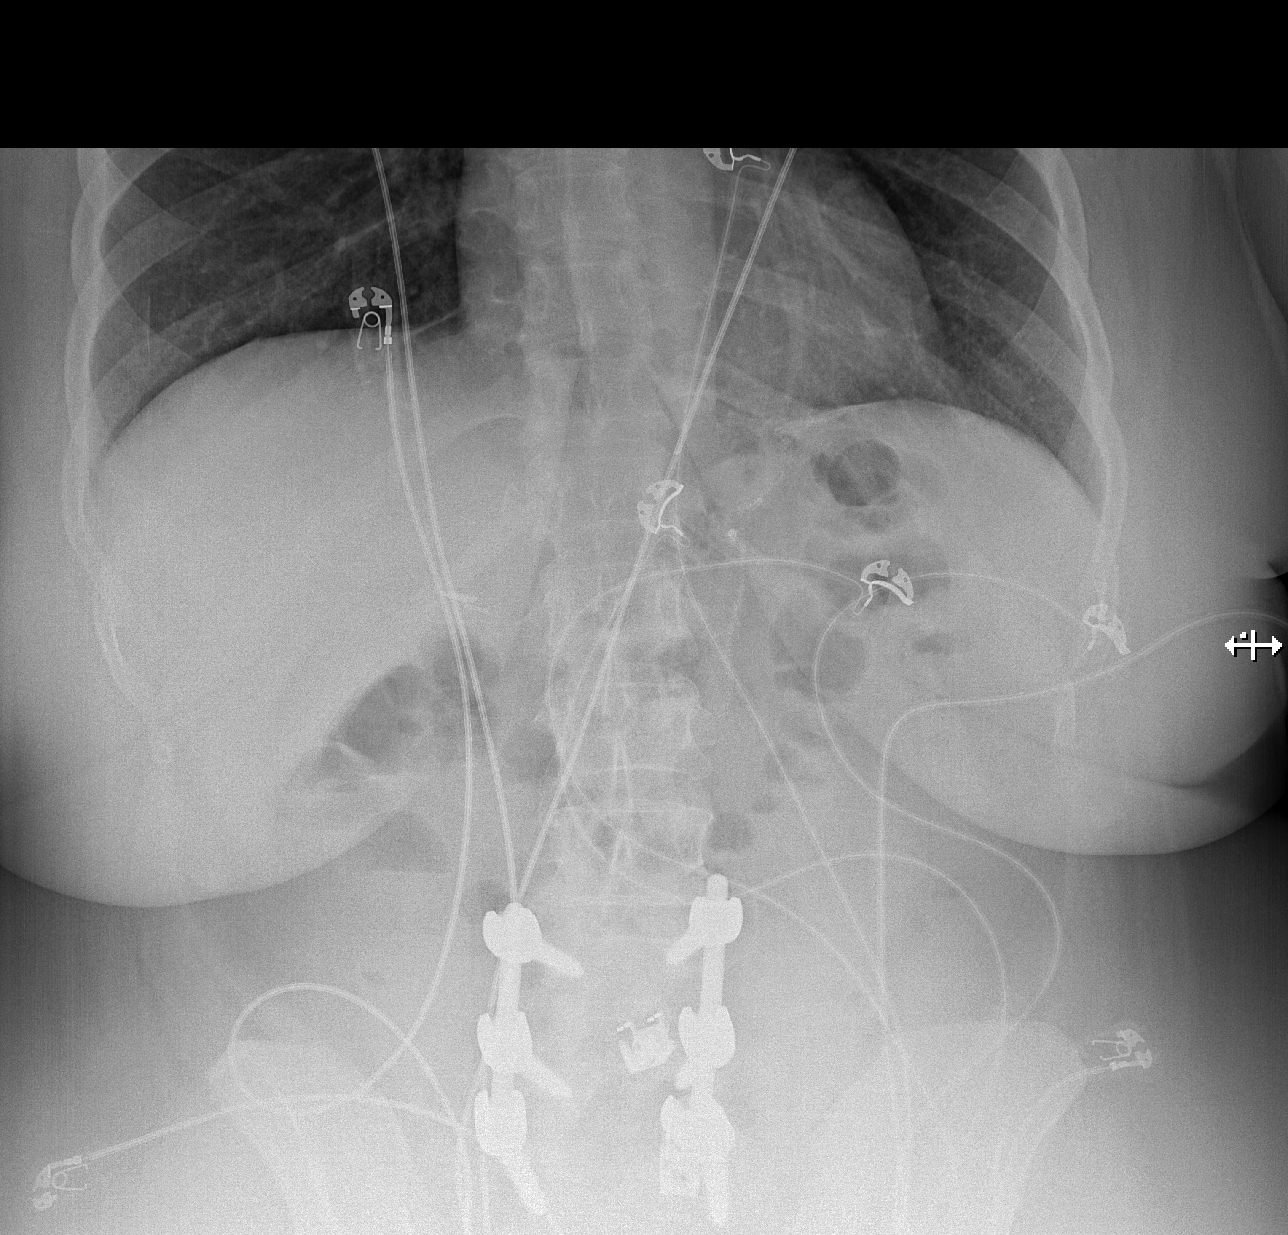

[t abdomen supine (1 of 2)]
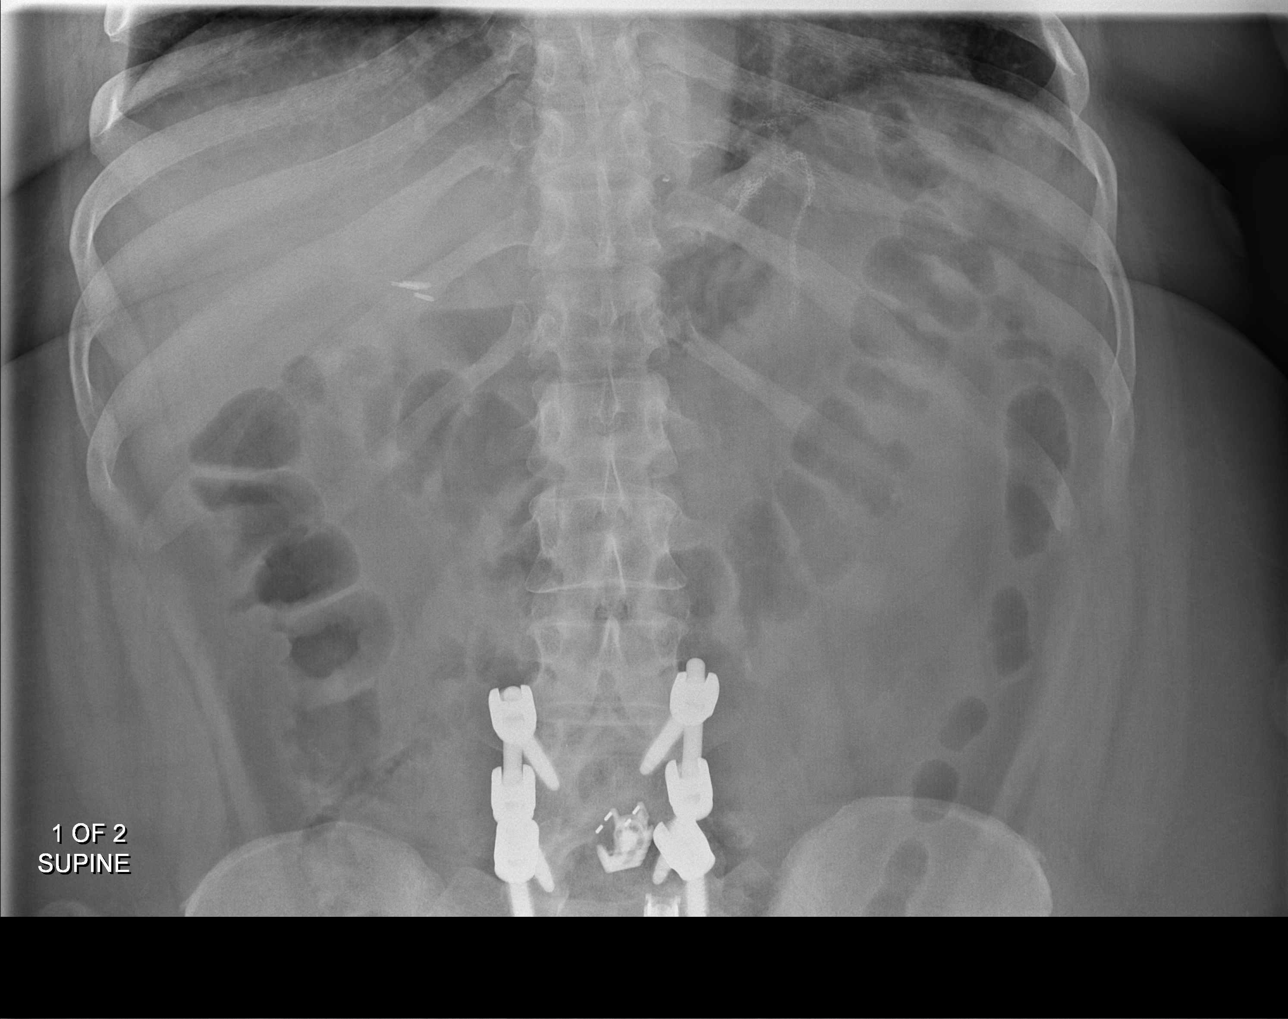

[t abdomen supine (2 of 2)]
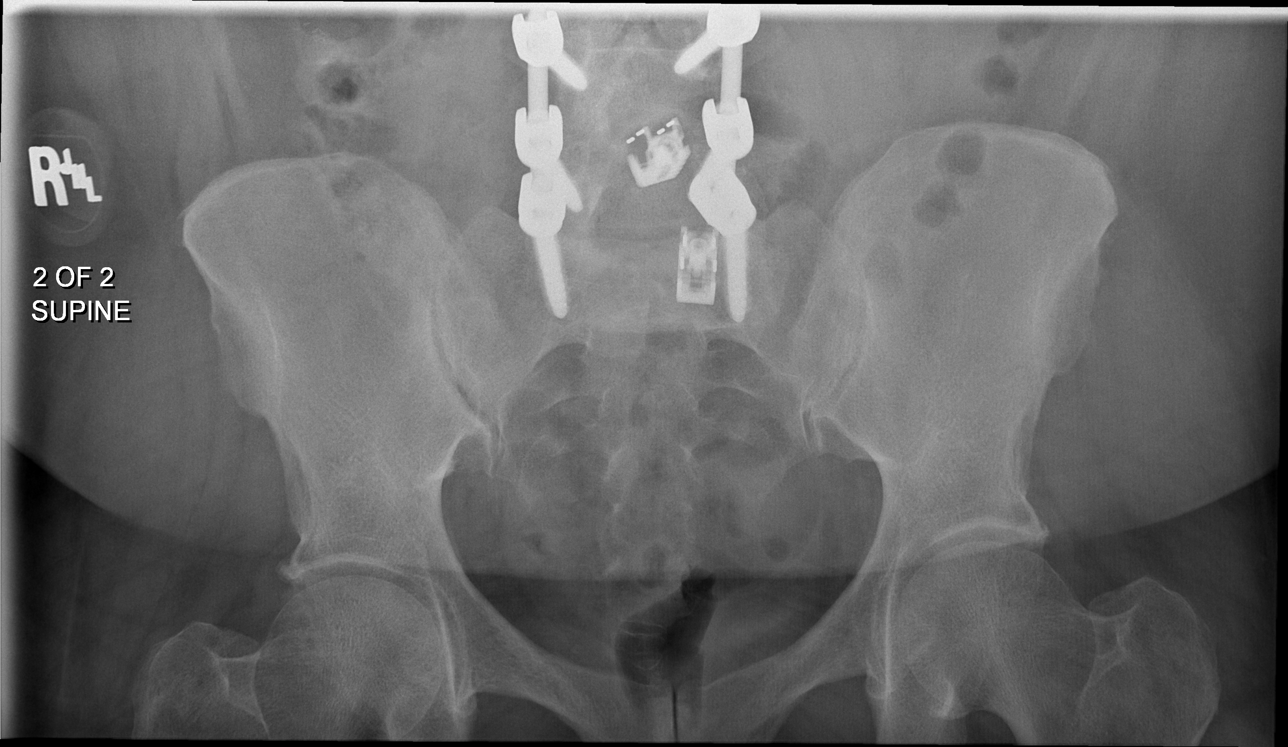

[4 of 4 positions shown; findings below may reference images not displayed]

FINDINGS: Supine and upright frontal views of the abdomen and pelvis as well
as an upright frontal view of the chest are obtained. The cardiac
silhouette is unremarkable. No airspace disease, effusion, or
pneumothorax. Bowel gas pattern is unremarkable. No obstruction or
ileus. Postsurgical changes are seen from bariatric surgery. Stable
lower lumbar fusion. No free gas in the greater peritoneal sac. No
masses or abnormal calcifications.
IMPRESSION: 1. Unremarkable bowel gas pattern.
2. No acute intrathoracic process.

## 2022-08-01 ENCOUNTER — Encounter (HOSPITAL_COMMUNITY): Payer: Self-pay | Admitting: *Deleted

## 2022-08-20 ENCOUNTER — Emergency Department (HOSPITAL_COMMUNITY)
Admission: EM | Admit: 2022-08-20 | Discharge: 2022-08-20 | Disposition: A | Discharge status: Home / Self Care | Payer: Medicare HMO | Attending: Emergency Medicine | Admitting: Emergency Medicine

## 2022-08-20 ENCOUNTER — Encounter (HOSPITAL_COMMUNITY): Payer: Self-pay

## 2022-08-20 ENCOUNTER — Emergency Department (HOSPITAL_COMMUNITY): Payer: Medicare HMO

## 2022-08-20 ENCOUNTER — Other Ambulatory Visit: Payer: Self-pay

## 2022-08-20 DIAGNOSIS — Z20822 Contact with and (suspected) exposure to covid-19: Secondary | ICD-10-CM | POA: Diagnosis not present

## 2022-08-20 DIAGNOSIS — J449 Chronic obstructive pulmonary disease, unspecified: Secondary | ICD-10-CM | POA: Diagnosis not present

## 2022-08-20 DIAGNOSIS — R42 Dizziness and giddiness: Secondary | ICD-10-CM | POA: Insufficient documentation

## 2022-08-20 LAB — CBC
HCT: 42.9 % (ref 36.0–46.0)
Hemoglobin: 14.1 g/dL (ref 12.0–15.0)
MCH: 30.7 pg (ref 26.0–34.0)
MCHC: 32.9 g/dL (ref 30.0–36.0)
MCV: 93.3 fL (ref 80.0–100.0)
Platelets: 187 10*3/uL (ref 150–400)
RBC: 4.6 MIL/uL (ref 3.87–5.11)
RDW: 14.5 % (ref 11.5–15.5)
WBC: 10 10*3/uL (ref 4.0–10.5)
nRBC: 0 % (ref 0.0–0.2)

## 2022-08-20 LAB — RESP PANEL BY RT-PCR (FLU A&B, COVID) ARPGX2
Influenza A by PCR: NEGATIVE
Influenza B by PCR: NEGATIVE
SARS Coronavirus 2 by RT PCR: NEGATIVE

## 2022-08-20 LAB — BASIC METABOLIC PANEL
Anion gap: 7 (ref 5–15)
BUN: 10 mg/dL (ref 6–20)
CO2: 22 mmol/L (ref 22–32)
Calcium: 9.7 mg/dL (ref 8.9–10.3)
Chloride: 110 mmol/L (ref 98–111)
Creatinine, Ser: 0.7 mg/dL (ref 0.44–1.00)
GFR, Estimated: >60 mL/min (ref >60)
Glucose, Bld: 84 mg/dL (ref 70–99)
Potassium: 5.6 mmol/L — ABNORMAL HIGH (ref 3.5–5.1)
Sodium: 139 mmol/L (ref 135–145)

## 2022-08-20 LAB — TROPONIN I (HIGH SENSITIVITY): Troponin I (High Sensitivity): 3 ng/L (ref <18)

## 2022-08-20 MED ORDER — SODIUM CHLORIDE 0.9 % IV BOLUS
1000.0000 mL | Freq: Once | INTRAVENOUS | Status: AC
  Administered 2022-08-20: 1000 mL via INTRAVENOUS

## 2022-08-20 MED ORDER — IOHEXOL 350 MG/ML SOLN
75.0000 mL | Freq: Once | INTRAVENOUS | Status: AC | PRN
  Administered 2022-08-20: 75 mL via INTRAVENOUS

## 2022-08-20 MED ORDER — MECLIZINE HCL 25 MG PO TABS
25.0000 mg | ORAL_TABLET | Freq: Once | ORAL | Status: AC
  Administered 2022-08-20: 25 mg via ORAL
  Filled 2022-08-20: qty 1

## 2022-08-20 MED ORDER — MECLIZINE HCL 25 MG PO TABS: 25.0000 mg | ORAL_TABLET | Freq: Three times a day (TID) | ORAL | 0 refills | Status: AC | PRN

## 2022-08-20 MED ORDER — MECLIZINE HCL 25 MG PO TABS: 25.0000 mg | ORAL_TABLET | Freq: Once | ORAL | Status: DC

## 2022-08-20 NOTE — ED Provider Notes (Signed)
Signout from J.F. Villareal PA-C at shift change. Briefly, patient presents for dizziness.    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     6:39 PM Reassessment performed. Patient appears comfortable.  When I approached her room, she was ambulating independently in the hallway.  She states that she is ready to go home.  Does report still having dizziness.  Labs and imaging personally reviewed and interpreted including: COVID-negative; BMP 5.6 with hemolysis; CBC unremarkable troponin normal.  CTA head and neck, agree no acute findings, stable aneurysm.   Reviewed additional pertinent lab work and imaging with patient at bedside.   Most current vital signs reviewed and are as follows: BP 139/86 (BP Location: Left Arm)   Pulse (!) 50   Temp 97.9 F (36.6 C) (Oral)   Resp 14   SpO2 100%   Plan: Meclizine, will give additional dose prior to discharge per patient request.   Home treatment: Avoidance of activities that make symptoms worse.    Return and follow-up instructions: Encouraged return to ED with worsening dizziness or other strokelike symptoms.  Encouraged patient to follow-up with their provider in 3 days. Patient verbalized understanding and agreed with plan.        Carlisle Cater, PA-C 08/20/22 1842    Drenda Freeze, MD 08/20/22 [phone]

## 2022-08-20 NOTE — ED Triage Notes (Signed)
Pt arrived via POV, c/o dizziness, nausea and vomiting.

## 2022-08-20 NOTE — Discharge Instructions (Addendum)
At this time there does not appear to be the presence of an emergent medical condition, however there is always the potential for conditions to change. Please read and follow the below instructions.  Please return to the Emergency Department immediately for any new or worsening symptoms. Please be sure to follow up with your Primary Care Provider within one week regarding your visit today; please call their office to schedule an appointment even if you are feeling better for a follow-up visit. Your potassium level was slightly elevated today.  Please have it rechecked by your primary care provider at your follow-up appointment.  Please see your ear nose and throat specialist for further evaluation of your dizziness. 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 As we discussed you declined CT or MRI pictures today.  If you have any recurrence of your dizziness or if you have any headache, nausea, vomiting, vision changes or any other concerning symptoms please return immediately to the emergency department.   Please read the additional information packets attached to your discharge summary.  Go to the nearest Emergency Department immediately if: You have fever or chills You vomit or have watery poop (diarrhea), and you cannot eat or drink anything. You have trouble: Talking. Walking. Swallowing. Using your arms, hands, or legs. You feel generally weak. You are not thinking clearly, or you have trouble forming sentences. A friend or family member may notice this. You have: Chest pain. Pain in your belly (abdomen). Shortness of breath. Sweating. Your vision changes. You are bleeding. You have a very bad headache. You have any new/concerning or worsening of symptoms  Do not take your medicine if  develop an itchy rash, swelling in your mouth or lips, or difficulty breathing; call 911 and seek immediate emergency  medical attention if this occurs.  You may review your lab tests and imaging results in their entirety on your MyChart account.  Please discuss all results of fully with your primary care provider and other specialist at your follow-up visit.  Note: Portions of this text may have been transcribed using voice recognition software. Every effort was made to ensure accuracy; however, inadvertent computerized transcription errors may still be present.

## 2022-08-20 NOTE — ED Provider Notes (Signed)
[facility]-EMERGENCY DEPT Provider Note   CSN: [id number] Arrival date & time: 08/20/22  1034     History  Chief Complaint  Patient presents with   Dizziness    Brittney Sanford is a 56 y.o. female history includes anxiety, depression, rheumatoid arthritis, chronic back pain, hyperlipidemia, BPPV, 2 mm vertebral artery pseudoaneurysm, fibromyalgia, GERD, COPD.  Patient presents to the ER today for evaluation of dizziness that began around 8 PM last night.  Patient was lying in bed when she rolled over she suddenly became dizzy she describes as a room spinning sensation.  She reports it lasted several minutes and recurs whenever she moves.  Patient reports a history of BPPV and reports this feels similar but she has not had an episode in over 1 year.  Patient reports her dizziness this morning is associated with 2 episodes of nonbloody/nonbilious emesis.  Additionally patient reports that she has been feeling tired over the past 1-2 days, she reports this is a general fatigue.  Patient denies fall, injury, headache, vision change, neck pain, chest pain/shortness of breath, abdominal pain, dysuria/hematuria, diarrhea, extremity numbness/weakness or any additional concerns.   HPI     Home Medications Prior to Admission medications   Medication Sig Start Date End Date Taking? Authorizing Provider  meclizine (ANTIVERT) 25 MG tablet Take 1 tablet (25 mg total) by mouth 3 (three) times daily as needed for up to 5 days for dizziness. 08/20/22 08/25/22 Yes Harlene Salts A, PA-C  albuterol (PROVENTIL) (2.5 MG/3ML) 0.083% nebulizer solution Take 2.5 mg by nebulization 4 (four) times daily as needed for wheezing or shortness of breath.    [provider]  albuterol (VENTOLIN HFA) 108 (90 Base) MCG/ACT inhaler Inhale 2 puffs into the lungs every 6 (six) hours as needed for wheezing or shortness of breath.    [provider]  ALPRAZolam Prudy Feeler) 0.5 MG tablet  Take 0.5 mg by mouth 2 (two) times daily as needed for anxiety. 07/09/20   [provider]  Calcium Citrate-Vitamin D (CELEBRATE CALCIUM PLUS 500) 500-333 MG-UNIT CHEW Chew 1 tablet by mouth in the morning, at noon, in the evening, and at bedtime.    [provider]  clotrimazole-betamethasone (LOTRISONE) cream Apply to affected area 2 times daily prn 06/16/21   Margarita Grizzle, MD  cyclobenzaprine (FLEXERIL) 10 MG tablet Take 10 mg by mouth 3 (three) times daily as needed for muscle spasms.    [provider]  DEXILANT 60 MG capsule Take 60 mg by mouth daily. 11/04/20   [provider]  gabapentin (NEURONTIN) 300 MG capsule Take 300 mg by mouth 3 (three) times daily as needed (nerve pain).    [provider]  LATUDA 40 MG TABS tablet Take 40 mg by mouth daily. 11/14/20   [provider]  LINZESS 290 MCG CAPS capsule Take 290 mcg by mouth daily. 09/18/20   [provider]  Multiple Vitamins-Minerals (BARIATRIC MULTIVITAMINS/IRON PO) Take 1 tablet by mouth daily. W/45 mg IRON    [provider]  oxyCODONE-acetaminophen (PERCOCET) 10-325 MG tablet Take 1 tablet by mouth every 4 (four) hours as needed for pain.    [provider]  SUMAtriptan (IMITREX) 100 MG tablet Take 100 mg by mouth every 2 (two) hours as needed for migraine. 10/08/20   [provider]      Allergies    Lithium and Aspirin    Review of Systems   Review of Systems Ten systems are reviewed and are  negative for acute change except as noted in the HPI  Physical Exam Updated Vital Signs BP 132/81 (BP Location: Right Arm)   Pulse (!) 53   Temp 97.9 F (36.6 C) (Oral)   Resp 17   SpO2 99%  Physical Exam Constitutional:      General: She is not in acute distress.    Appearance: Normal appearance. She is well-developed. She is not ill-appearing or diaphoretic.  HENT:     Head: Normocephalic and atraumatic.  Eyes:     General: Vision grossly  intact. Gaze aligned appropriately.     Pupils: Pupils are equal, round, and reactive to light.  Neck:     Trachea: Trachea and phonation normal.  Cardiovascular:     Rate and Rhythm: Normal rate and regular rhythm.     Pulses: Normal pulses.     Heart sounds: Normal heart sounds.  Pulmonary:     Effort: Pulmonary effort is normal. No respiratory distress.     Breath sounds: Normal breath sounds.  Abdominal:     General: There is no distension.     Palpations: Abdomen is soft.     Tenderness: There is no abdominal tenderness. There is no guarding or rebound.  Musculoskeletal:        General: Normal range of motion.     Cervical back: Normal range of motion.     Right lower leg: No edema.     Left lower leg: No edema.  Skin:    General: Skin is warm and dry.  Neurological:     Mental Status: She is alert.     GCS: GCS eye subscore is 4. GCS verbal subscore is 5. GCS motor subscore is 6.     Comments: Speech is clear and goal oriented, follows commands Major Cranial nerves without deficit, no facial droop Normal strength in upper and lower extremities bilaterally including dorsiflexion and plantar flexion, strong and equal grip strength Sensation normal to light and sharp touch Moves extremities without ataxia, coordination intact Normal finger to nose and rapid alternating movements Neg romberg, no pronator drift Normal gait Normal heel-shin and balance  Psychiatric:        Behavior: Behavior normal.     ED Results / Procedures / Treatments   Labs (all labs ordered are listed, but only abnormal results are displayed) Labs Reviewed  BASIC METABOLIC PANEL - Abnormal; Notable for the following components:      Result Value   Potassium 5.6 (*)    All other components within normal limits  RESP PANEL BY RT-PCR (FLU A&B, COVID) [id number]  CBC  TROPONIN I (HIGH SENSITIVITY)    EKG EKG Interpretation  Date/Time:  Wednesday August 20 2022 10:43:56 EDT Ventricular Rate:   47 PR Interval:  161 QRS Duration: 88 QT Interval:  481 QTC Calculation: 426 R Axis:   62 Text Interpretation: Sinus bradycardia No significant change since last tracing Confirmed by Melene Plan [phone]) on 08/20/2022 12:51:12 PM  Radiology DG Chest 2 View  Result Date: 08/20/2022 CLINICAL DATA:  Dizziness, nausea and vomiting. EXAM: CHEST - 2 VIEW COMPARISON:  Chest x-ray 02/27/2021 FINDINGS: The cardiac silhouette, mediastinal and hilar contours are normal. Chronic emphysematous and bronchitic type lung changes likely related to smoking. No infiltrates or effusions. The bony thorax is intact. IMPRESSION: Chronic lung changes likely related to smoking. No acute pulmonary findings. Electronically Signed   By: Rudie Meyer M.D.   On: 08/20/2022 12:15    Procedures Procedures  Medications Ordered in ED Medications  sodium chloride 0.9 % bolus 1,000 mL (0 mLs Intravenous Stopped 08/20/22 1255)  meclizine (ANTIVERT) tablet 25 mg (25 mg Oral Given 08/20/22 1310)    ED Course/ Medical Decision Making/ A&P Clinical Course as of 08/20/22 1538  Wed Aug 20, 2022  1359 Patient reassessed she is resting comfortably no acute distress.  Patient reports her dizziness has improved following meclizine. [BM]  1430 Resp Panel by RT-PCR (Flu A&B, Covid) Anterior Nasal Swab COVID/influenza panel negative [BM]  4034 Basic metabolic panel(!) BMP shows slightly elevated potassium at 5.6.  No emergent electrolyte derangement, AKI or gap [BM]  1430 Troponin I (High Sensitivity) High-sensitivity troponin within normal limits, low suspicion for ACS [BM]  1430 CBC CBC without leukocytosis, anemia or thrombocytopenia. [BM]  1430 ED EKG I personally reviewed and interpreted patient's twelve-lead EKG.  I do not appreciate any obvious acute ischemic changes.  Shows sinus bradycardia similar to prior. [BM]  1430 DG Chest 2 View I have personally reviewed and interpreted patient's two-view chest x-ray.  I do not  appreciate any obvious PTX, PNA or other acute pulmonary process [BM]  1533 At discharge patient was reassessed she reports a return of her dizziness.  Long discussion was held with patient, she is anxious given her history of vertebral artery aneurysm.  Risk versus benefits of CT imaging were discussed patient now elects to proceed with CT imaging. [BM]    Clinical Course User Index [BM] Deliah Boston, PA-C                           Medical Decision Making 57 year old female presented for evaluation of dizziness that began last night when she was lying in bed and rolled over.  She describes a room spinning sensation that reoccurs with position changes.  On evaluation today she is well-appearing and in no acute distress.  She is noted to be bradycardic on cardiac monitor but this is known from March of this year.  She denies any associated chest pain or shortness of breath.  She has a reassuring neurologic examination today without focal neurodeficits.  No associated neck pain or headache.  No clear evidence of nystagmus on examination but pain does worsen with movement of head.  She has a history of BPPV treated by ENT, she has not tried meclizine as of this time.  Patient reports symptoms today feel similar to history of BPPV.  Differential includes but not limited to BPPV, CVA, MS, dissection, arrhythmia, anemia.  Including CBC, lab BMP, troponin ordered along with EKG and chest x-ray.  Will provide patient with IV fluids and meclizine and monitor.  CT/MRI was discussed with patient today, showed vision making made.  Patient declined advanced imaging she would like to try medications above.  We will continue to monitor. ------ Patient reports that she was feeling much better after IV fluids and meclizine and she did not want imaging today.  I encourage patient to follow-up with her ENT and neurosurgeon. ------ At time of discharge patient reported an increased dizziness and reported some  right-sided neck pain as well which is some new as at this time.  Given history of above I again discussed advanced imaging with the patient.  She would like to now pursue CT imaging, will order CTA of the head and neck to evaluate for vertebral artery aneurysm.  Repeat neuro examination unchanged and patient has steady unassisted gait.  Care handoff  was given to Rhea Bleacher PA-C plan of care is to follow-up on CT imaging, pending no emergent findings anticipate discharge with meclizine and ENT follow-up.  Amount and/or Complexity of Data Reviewed External Data Reviewed: notes.    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  It appears that she had seen neurology and ENT in 2022, they appear to have been treating her dizziness with meclizine. Labs: ordered. Radiology: ordered.  Risk Prescription drug management.     Note: Portions of this report may have been transcribed using voice recognition software. Every effort was made to ensure accuracy; however, inadvertent computerized transcription errors may still be present.         Final Clinical Impression(s) / ED Diagnoses Final diagnoses:  Dizziness    Rx / DC Orders ED Discharge Orders          Ordered    meclizine (ANTIVERT) 25 MG tablet  3 times daily PRN        08/20/22 1457              Bill Salinas, PA-C 08/20/22 1543    Melene Plan, DO 08/22/22 1918

## 2022-11-02 ENCOUNTER — Other Ambulatory Visit: Payer: Self-pay

## 2022-11-02 ENCOUNTER — Emergency Department (HOSPITAL_BASED_OUTPATIENT_CLINIC_OR_DEPARTMENT_OTHER)
Admission: EM | Admit: 2022-11-02 | Discharge: 2022-11-02 | Disposition: A | Discharge status: Home / Self Care | Payer: Medicare HMO | Attending: Emergency Medicine | Admitting: Emergency Medicine

## 2022-11-02 ENCOUNTER — Encounter (HOSPITAL_BASED_OUTPATIENT_CLINIC_OR_DEPARTMENT_OTHER): Payer: Self-pay | Admitting: Emergency Medicine

## 2022-11-02 DIAGNOSIS — J449 Chronic obstructive pulmonary disease, unspecified: Secondary | ICD-10-CM | POA: Insufficient documentation

## 2022-11-02 DIAGNOSIS — R059 Cough, unspecified: Secondary | ICD-10-CM | POA: Diagnosis present

## 2022-11-02 DIAGNOSIS — Z20822 Contact with and (suspected) exposure to covid-19: Secondary | ICD-10-CM | POA: Insufficient documentation

## 2022-11-02 DIAGNOSIS — R051 Acute cough: Secondary | ICD-10-CM | POA: Insufficient documentation

## 2022-11-02 DIAGNOSIS — Z7951 Long term (current) use of inhaled steroids: Secondary | ICD-10-CM | POA: Diagnosis not present

## 2022-11-02 DIAGNOSIS — J45909 Unspecified asthma, uncomplicated: Secondary | ICD-10-CM | POA: Insufficient documentation

## 2022-11-02 LAB — RESP PANEL BY RT-PCR (FLU A&B, COVID) ARPGX2
Influenza A by PCR: NEGATIVE
Influenza B by PCR: NEGATIVE
SARS Coronavirus 2 by RT PCR: NEGATIVE

## 2022-11-02 MED ORDER — LORATADINE 10 MG PO TABS: 10.0000 mg | ORAL_TABLET | Freq: Every day | ORAL | 0 refills | Status: DC

## 2022-11-02 MED ORDER — BENZONATATE 100 MG PO CAPS: 100.0000 mg | ORAL_CAPSULE | Freq: Three times a day (TID) | ORAL | 0 refills | Status: DC

## 2022-11-02 MED ORDER — FLUTICASONE PROPIONATE 50 MCG/ACT NA SUSP: 1.0000 | Freq: Every day | NASAL | 2 refills | Status: DC

## 2022-11-02 NOTE — ED Provider Notes (Signed)
[facility] EMERGENCY DEPARTMENT Provider Note   CSN: [id number] Arrival date & time: 11/02/22  5449     History  Chief Complaint  Patient presents with   Cough    Brittney Sanford is a 56 y.o. female.  With a history of asthma, anxiety, depression, rheumatoid arthritis, anemia myalgia, COPD who presents ED for evaluation of cough.  States she typically gets a cough about this time every year in the fall.  Saw her primary care provider who started her on Mucinex and antibiotics.  Reports her cough is gone a little bit better but is still present.  Cough is very intermittent and is dry.  Denies shortness of breath, fevers, chills, chest pain, nausea, vomiting, abdominal pain, diarrhea, recent exposure to sick people, hemoptysis.   Cough      Home Medications Prior to Admission medications   Medication Sig Start Date End Date Taking? Authorizing Provider  benzonatate (TESSALON) 100 MG capsule Take 1 capsule (100 mg total) by mouth every 8 (eight) hours. 11/02/22  Yes Kendalyn Cranfield, Edsel Petrin, PA-C  fluticasone (FLONASE) 50 MCG/ACT nasal spray Place 1 spray into both nostrils daily. 11/02/22  Yes Lauriel Helin, Edsel Petrin, PA-C  loratadine (CLARITIN) 10 MG tablet Take 1 tablet (10 mg total) by mouth daily. 11/02/22  Yes Kamya Watling, Edsel Petrin, PA-C  albuterol (PROVENTIL) (2.5 MG/3ML) 0.083% nebulizer solution Take 2.5 mg by nebulization 4 (four) times daily as needed for wheezing or shortness of breath.    [provider]  albuterol (VENTOLIN HFA) 108 (90 Base) MCG/ACT inhaler Inhale 2 puffs into the lungs every 6 (six) hours as needed for wheezing or shortness of breath.    [provider]  ALPRAZolam Prudy Feeler) 0.5 MG tablet Take 0.5 mg by mouth 2 (two) times daily as needed for anxiety. 07/09/20   [provider]  Calcium Citrate-Vitamin D (CELEBRATE CALCIUM PLUS 500) 500-333 MG-UNIT CHEW Chew 1 tablet by mouth in the morning, at noon, in the evening, and at bedtime.     [provider]  clotrimazole-betamethasone (LOTRISONE) cream Apply to affected area 2 times daily prn 06/16/21   Margarita Grizzle, MD  cyclobenzaprine (FLEXERIL) 10 MG tablet Take 10 mg by mouth 3 (three) times daily as needed for muscle spasms.    [provider]  DEXILANT 60 MG capsule Take 60 mg by mouth daily. 11/04/20   [provider]  gabapentin (NEURONTIN) 300 MG capsule Take 300 mg by mouth 3 (three) times daily as needed (nerve pain).    [provider]  LATUDA 40 MG TABS tablet Take 40 mg by mouth daily. 11/14/20   [provider]  LINZESS 290 MCG CAPS capsule Take 290 mcg by mouth daily. 09/18/20   [provider]  Multiple Vitamins-Minerals (BARIATRIC MULTIVITAMINS/IRON PO) Take 1 tablet by mouth daily. W/45 mg IRON    [provider]  oxyCODONE-acetaminophen (PERCOCET) 10-325 MG tablet Take 1 tablet by mouth every 4 (four) hours as needed for pain.    [provider]  SUMAtriptan (IMITREX) 100 MG tablet Take 100 mg by mouth every 2 (two) hours as needed for migraine. 10/08/20   [provider]      Allergies    Lithium and Aspirin    Review of Systems   Review of Systems  Respiratory:  Positive for cough.   All other systems reviewed and are negative.   Physical Exam Updated Vital Signs BP (!) 132/94   Pulse 68   Temp 97.9 F (36.6  C) (Oral)   Resp 18   Ht 5\' 4"  (1.626 m)   Wt 84.4 kg   SpO2 99%   BMI 31.93 kg/m  Physical Exam Vitals and nursing note reviewed.  Constitutional:      General: She is not in acute distress.    Appearance: Normal appearance. She is well-developed. She is obese. She is not ill-appearing, toxic-appearing or diaphoretic.  HENT:     Head: Normocephalic and atraumatic.     Nose: No congestion or rhinorrhea.     Mouth/Throat:     Mouth: Mucous membranes are moist.     Pharynx: Oropharynx is clear. No oropharyngeal exudate or posterior oropharyngeal erythema.   Eyes:     Conjunctiva/sclera: Conjunctivae normal.  Cardiovascular:     Rate and Rhythm: Normal rate and regular rhythm.     Pulses: Normal pulses.     Heart sounds: No murmur heard. Pulmonary:     Effort: Pulmonary effort is normal. No respiratory distress.     Breath sounds: Normal breath sounds. No stridor. No wheezing, rhonchi or rales.  Abdominal:     Palpations: Abdomen is soft.     Tenderness: There is no abdominal tenderness.  Musculoskeletal:        General: No swelling.     Cervical back: Normal range of motion and neck supple.     Right lower leg: No edema.     Left lower leg: No edema.  Skin:    General: Skin is warm and dry.     Capillary Refill: Capillary refill takes less than 2 seconds.  Neurological:     General: No focal deficit present.     Mental Status: She is alert and oriented to person, place, and time.  Psychiatric:        Mood and Affect: Mood normal.     ED Results / Procedures / Treatments   Labs (all labs ordered are listed, but only abnormal results are displayed) Labs Reviewed  RESP PANEL BY RT-PCR (FLU A&B, COVID) [id number]    EKG None  Radiology No results found.  Procedures Procedures    Medications Ordered in ED Medications - No data to display  ED Course/ Medical Decision Making/ A&P                           Medical Decision Making This patient presents to the ED for concern of cough, this involves an extensive number of treatment options, and is a complaint that carries with it a high risk of complications and morbidity.  Differential diagnosis for emergent cause of cough includes but is not limited to upper respiratory infection, lower respiratory infection, allergies, asthma, irritants, foreign body, medications such as ACE inhibitors, reflux, asthma, CHF, lung cancer, interstitial lung disease, psychiatric causes, postnasal drip and postinfectious bronchospasm.    Co morbidities that complicate the patient  evaluation  asthma, anxiety, depression, rheumatoid arthritis, anemia myalgia, COPD  My initial workup includes respiratory panel  Additional history obtained from: Nursing notes from this visit.  I ordered, reviewed and interpreted labs which include: respiratory panel. negative   I ordered imaging studies including patient declined   Afebrile, hemodynamically stable.  56 year old female presents ED for evaluation of isolated cough without other symptoms.  cough has been present for the past month.  She has seen her primary care provider who started her on Mucinex and antibiotics.  She then followed up with him recently and stated that he  said it will "get better, just give it some time."  She was unsatisfied with this answer so presents to the ED for further evaluation.  In triage patient declined everything besides the respiratory panel and stated that she wanted to leave after triage.  I assessed her and obtain a full history and physical exam at that time.  Physical exam is completely unremarkable.  Patient did not cough during the exam.  Her lungs are without adventitious breath sounds.  She has no other complaints.  I have low suspicion for acute emergencies as a cause of her cough.  She will be sent prescriptions for Claritin, Flonase and Tessalon Perles and strong encouraged to follow-up with her primary care provider again for reevaluation and further management.  Gave patient return precautions.  Stable at discharge.   At this time there does not appear to be any evidence of an acute emergency medical condition and the patient appears stable for discharge with appropriate outpatient follow up. Diagnosis was discussed with patient who verbalizes understanding of care plan and is agreeable to discharge. I have discussed return precautions with patient who verbalizes understanding. Patient encouraged to follow-up with their PCP within 1 week. All questions answered.  Note: Portions of  this report may have been transcribed using voice recognition software. Every effort was made to ensure accuracy; however, inadvertent computerized transcription errors may still be present.          Final Clinical Impression(s) / ED Diagnoses Final diagnoses:  Acute cough    Rx / DC Orders ED Discharge Orders          Ordered    benzonatate (TESSALON) 100 MG capsule  Every 8 hours        11/02/22 1038    loratadine (CLARITIN) 10 MG tablet  Daily        11/02/22 1038    fluticasone (FLONASE) 50 MCG/ACT nasal spray  Daily        11/02/22 1038              Michelle Piper, PA-C 11/02/22 1045    Loetta Rough, MD 11/02/22 1123

## 2022-11-02 NOTE — Discharge Instructions (Signed)
You have been seen today for your complaint of cough. Your lab work was negative. Your discharge medications include tessalon perles, claritin, flonase. Follow up with: your PCP in one week for reevaluation Please seek immediate medical care if you develop any of the following symptoms: You cough up blood. You have difficulty breathing. Your heartbeat is very fast. At this time there does not appear to be the presence of an emergent medical condition, however there is always the potential for conditions to change. Please read and follow the below instructions.  Do not take your medicine if  develop an itchy rash, swelling in your mouth or lips, or difficulty breathing; call 911 and seek immediate emergency medical attention if this occurs.  You may review your lab tests and imaging results in their entirety on your MyChart account.  Please discuss all results of fully with your primary care provider and other specialist at your follow-up visit.  Note: Portions of this text may have been transcribed using voice recognition software. Every effort was made to ensure accuracy; however, inadvertent computerized transcription errors may still be present.

## 2022-11-02 NOTE — ED Triage Notes (Signed)
Pt arrives pov, steady gait c/o cough x 1 month, tx with abx and no improvement. Pt denies fever or shob. Cough drops bring minimal relief. Pt declined CXR

## 2022-12-23 ENCOUNTER — Encounter: Payer: Self-pay | Admitting: Plastic Surgery

## 2022-12-23 ENCOUNTER — Ambulatory Visit (INDEPENDENT_AMBULATORY_CARE_PROVIDER_SITE_OTHER): Payer: Medicare HMO | Admitting: Plastic Surgery

## 2022-12-23 VITALS — BP 111/77 | HR 72 | Ht 64.0 in | Wt 190.0 lb

## 2022-12-23 DIAGNOSIS — M793 Panniculitis, unspecified: Secondary | ICD-10-CM | POA: Insufficient documentation

## 2022-12-23 DIAGNOSIS — Z9884 Bariatric surgery status: Secondary | ICD-10-CM

## 2022-12-23 DIAGNOSIS — R21 Rash and other nonspecific skin eruption: Secondary | ICD-10-CM

## 2022-12-23 DIAGNOSIS — F17210 Nicotine dependence, cigarettes, uncomplicated: Secondary | ICD-10-CM

## 2022-12-23 NOTE — Progress Notes (Signed)
Patient ID: Brittney Sanford, female    DOB: 02-05-1966, 57 y.o.   MRN: [id number]   Chief Complaint  Patient presents with   Consult         The patient is a 57 year old female here for evaluation of her abdomen.  She is 5 feet 4 inches tall and weighs 190 pounds.  After her gastric bypass she was able to lose 107 pounds.  Past surgical history includes a hysterectomy, appendectomy, cholecystectomy, fibroid removal and a gastric sleeve for weight loss.  She does not have diabetes and is not on a blood thinner but is smoking.  She complains of rashes and skin breakdown in her folds.  Her past medical history is positive for asthma, anxiety, depression, rheumatoid arthritis, hyperlipidemia, chronic back pain, vertebral artery pseudoaneurysm, fibromyalgia, gastric esophageal reflux disease and anemia.  She also has COPD and was evaluated in the emergency room in December for a cough.  She was also seen in September 2023 for dizziness.    Review of Systems  Constitutional: Negative.   HENT: Negative.    Eyes: Negative.   Respiratory:  Negative for chest tightness and shortness of breath.   Cardiovascular: Negative.   Gastrointestinal: Negative.   Endocrine: Negative.   Genitourinary: Negative.   Musculoskeletal: Negative.     Past Medical History:  Diagnosis Date   Anemia    Anxiety    Asthma    Chronic back pain    COPD (chronic obstructive pulmonary disease) (HCC)    Depression    Fibromyalgia    GERD (gastroesophageal reflux disease)    Headache    MIGRAINES   Hyperlipidemia    Pseudoaneurysm ([place])    behind left ear per pt    Rheumatoid arthritis ([place])    Sleep apnea    CPAP     Past Surgical History:  Procedure Laterality Date   ABDOMINAL HYSTERECTOMY     APPENDECTOMY     BACK SURGERY     CHOLECYSTECTOMY     LAPAROSCOPIC GASTRIC SLEEVE RESECTION N/A 01/07/2021   Procedure: LAPAROSCOPIC GASTRIC SLEEVE RESECTION AND HIATAL HERNIA REPAIR;  Surgeon: Johnathan Hausen,  MD;  Location: [facility]S;  Service: General;  Laterality: N/A;   OVARIAN CYST REMOVAL     UPPER GI ENDOSCOPY N/A 01/07/2021   Procedure: UPPER GI ENDOSCOPY;  Surgeon: Johnathan Hausen, MD;  Location: [facility];  Service: General;  Laterality: N/A;      Current Outpatient Medications:    albuterol (PROVENTIL) (2.5 MG/3ML) 0.083% nebulizer solution, Take 2.5 mg by nebulization 4 (four) times daily as needed for wheezing or shortness of breath., Disp: , Rfl:    albuterol (VENTOLIN HFA) 108 (90 Base) MCG/ACT inhaler, Inhale 2 puffs into the lungs every 6 (six) hours as needed for wheezing or shortness of breath., Disp: , Rfl:    ALPRAZolam (XANAX) 0.5 MG tablet, Take 0.5 mg by mouth 2 (two) times daily as needed for anxiety., Disp: , Rfl:    benzonatate (TESSALON) 100 MG capsule, Take 1 capsule (100 mg total) by mouth every 8 (eight) hours., Disp: 21 capsule, Rfl: 0   Calcium Citrate-Vitamin D (CELEBRATE CALCIUM PLUS 500) 500-333 MG-UNIT CHEW, Chew 1 tablet by mouth in the morning, at noon, in the evening, and at bedtime., Disp: , Rfl:    clotrimazole-betamethasone (LOTRISONE) cream, Apply to affected area 2 times daily prn, Disp: 15 g, Rfl: 0   cyclobenzaprine (FLEXERIL) 10 MG tablet, Take 10 mg by mouth 3 (three) times  daily as needed for muscle spasms., Disp: , Rfl:    esomeprazole (NEXIUM) 40 MG capsule, Take 40 mg by mouth daily., Disp: , Rfl:    fluticasone (FLONASE) 50 MCG/ACT nasal spray, Place 1 spray into both nostrils daily., Disp: 15.8 mL, Rfl: 2   gabapentin (NEURONTIN) 300 MG capsule, Take 300 mg by mouth 3 (three) times daily as needed (nerve pain)., Disp: , Rfl:    loratadine (CLARITIN) 10 MG tablet, Take 1 tablet (10 mg total) by mouth daily., Disp: 30 tablet, Rfl: 0   Multiple Vitamins-Minerals (BARIATRIC MULTIVITAMINS/IRON PO), Take 1 tablet by mouth daily. W/45 mg IRON, Disp: , Rfl:    oxyCODONE-acetaminophen (PERCOCET) 10-325 MG tablet, Take 1 tablet by mouth every 4 (four) hours as  needed for pain., Disp: , Rfl:    SUMAtriptan (IMITREX) 100 MG tablet, Take 100 mg by mouth every 2 (two) hours as needed for migraine., Disp: , Rfl:    TRULANCE 3 MG TABS, Take 1 tablet by mouth daily., Disp: , Rfl:    Objective:   Vitals:   12/23/22 1122  BP: 111/77  Pulse: 72  SpO2: 100%    Physical Exam Vitals reviewed.  Cardiovascular:     Rate and Rhythm: Normal rate.     Pulses: Normal pulses.  Pulmonary:     Effort: Pulmonary effort is normal.  Abdominal:     General: There is no distension.     Palpations: Abdomen is soft.  Musculoskeletal:        General: No swelling or deformity.     Cervical back: Normal range of motion.  Skin:    General: Skin is warm.     Coloration: Skin is not jaundiced.     Findings: No bruising.  Neurological:     Mental Status: She is alert and oriented to person, place, and time.  Psychiatric:        Mood and Affect: Mood normal.        Behavior: Behavior normal.        Thought Content: Thought content normal.     Assessment & Plan:  S/P laparoscopic sleeve gastrectomy  Panniculitis  The patient is interested in a panniculectomy.  We reviewed the difference between an abdominoplasty and a panniculectomy.  She understands she must be 3 months tobacco free prior to any elective surgery with [place].  She will call us when she is finished smoking and we will see her back in follow-up.  Pictures were obtained of the patient and placed in the chart with the patient's or guardian's permission.  [place], DO

## 2023-02-25 ENCOUNTER — Emergency Department (HOSPITAL_COMMUNITY): Payer: Medicare HMO

## 2023-02-25 ENCOUNTER — Other Ambulatory Visit: Payer: Self-pay

## 2023-02-25 ENCOUNTER — Emergency Department (HOSPITAL_COMMUNITY)
Admission: EM | Admit: 2023-02-25 | Discharge: 2023-02-25 | Disposition: A | Discharge status: Home / Self Care | Payer: Medicare HMO | Attending: Emergency Medicine | Admitting: Emergency Medicine

## 2023-02-25 ENCOUNTER — Encounter (HOSPITAL_COMMUNITY): Payer: Self-pay

## 2023-02-25 DIAGNOSIS — R059 Cough, unspecified: Secondary | ICD-10-CM | POA: Diagnosis present

## 2023-02-25 DIAGNOSIS — Z20822 Contact with and (suspected) exposure to covid-19: Secondary | ICD-10-CM | POA: Insufficient documentation

## 2023-02-25 DIAGNOSIS — Z7951 Long term (current) use of inhaled steroids: Secondary | ICD-10-CM | POA: Diagnosis not present

## 2023-02-25 DIAGNOSIS — R051 Acute cough: Secondary | ICD-10-CM | POA: Diagnosis not present

## 2023-02-25 DIAGNOSIS — R0789 Other chest pain: Secondary | ICD-10-CM | POA: Diagnosis not present

## 2023-02-25 DIAGNOSIS — R0600 Dyspnea, unspecified: Secondary | ICD-10-CM | POA: Diagnosis not present

## 2023-02-25 DIAGNOSIS — R079 Chest pain, unspecified: Secondary | ICD-10-CM

## 2023-02-25 DIAGNOSIS — F17210 Nicotine dependence, cigarettes, uncomplicated: Secondary | ICD-10-CM | POA: Insufficient documentation

## 2023-02-25 DIAGNOSIS — J45909 Unspecified asthma, uncomplicated: Secondary | ICD-10-CM | POA: Insufficient documentation

## 2023-02-25 LAB — CBC WITH DIFFERENTIAL/PLATELET
Abs Immature Granulocytes: 0.01 10*3/uL (ref 0.00–0.07)
Basophils Absolute: 0.1 10*3/uL (ref 0.0–0.1)
Basophils Relative: 1 %
Eosinophils Absolute: 0.1 10*3/uL (ref 0.0–0.5)
Eosinophils Relative: 1 %
HCT: 39.1 % (ref 36.0–46.0)
Hemoglobin: 13 g/dL (ref 12.0–15.0)
Immature Granulocytes: 0 %
Lymphocytes Relative: 35 %
Lymphs Abs: 2.8 10*3/uL (ref 0.7–4.0)
MCH: 29.7 pg (ref 26.0–34.0)
MCHC: 33.2 g/dL (ref 30.0–36.0)
MCV: 89.3 fL (ref 80.0–100.0)
Monocytes Absolute: 0.4 10*3/uL (ref 0.1–1.0)
Monocytes Relative: 5 %
Neutro Abs: 4.6 10*3/uL (ref 1.7–7.7)
Neutrophils Relative %: 58 %
Platelets: 192 10*3/uL (ref 150–400)
RBC: 4.38 MIL/uL (ref 3.87–5.11)
RDW: 14.4 % (ref 11.5–15.5)
WBC: 7.9 10*3/uL (ref 4.0–10.5)
nRBC: 0 % (ref 0.0–0.2)

## 2023-02-25 LAB — TROPONIN I (HIGH SENSITIVITY): Troponin I (High Sensitivity): 5 ng/L (ref <18)

## 2023-02-25 LAB — BASIC METABOLIC PANEL
Anion gap: 6 (ref 5–15)
BUN: 8 mg/dL (ref 6–20)
CO2: 29 mmol/L (ref 22–32)
Calcium: 9.5 mg/dL (ref 8.9–10.3)
Chloride: 105 mmol/L (ref 98–111)
Creatinine, Ser: 0.69 mg/dL (ref 0.44–1.00)
GFR, Estimated: >60 mL/min (ref >60)
Glucose, Bld: 84 mg/dL (ref 70–99)
Potassium: 3.9 mmol/L (ref 3.5–5.1)
Sodium: 140 mmol/L (ref 135–145)

## 2023-02-25 LAB — RESP PANEL BY RT-PCR (RSV, FLU A&B, COVID)  RVPGX2
Influenza A by PCR: NEGATIVE
Influenza B by PCR: NEGATIVE
Resp Syncytial Virus by PCR: NEGATIVE
SARS Coronavirus 2 by RT PCR: NEGATIVE

## 2023-02-25 MED ORDER — BENZONATATE 100 MG PO CAPS
100.0000 mg | ORAL_CAPSULE | Freq: Once | ORAL | Status: AC
  Administered 2023-02-25: 100 mg via ORAL
  Filled 2023-02-25: qty 1

## 2023-02-25 MED ORDER — ALBUTEROL SULFATE HFA 108 (90 BASE) MCG/ACT IN AERS
2.0000 | INHALATION_SPRAY | RESPIRATORY_TRACT | Status: DC | PRN
  Administered 2023-02-25: 2 via RESPIRATORY_TRACT
  Filled 2023-02-25: qty 6.7

## 2023-02-25 MED ORDER — IOHEXOL 300 MG/ML  SOLN
100.0000 mL | Freq: Once | INTRAMUSCULAR | Status: AC | PRN
  Administered 2023-02-25: 100 mL via INTRAVENOUS

## 2023-02-25 MED ORDER — AZITHROMYCIN 250 MG PO TABS: 250.0000 mg | ORAL_TABLET | Freq: Every day | ORAL | 0 refills | Status: DC

## 2023-02-25 MED ORDER — SODIUM CHLORIDE (PF) 0.9 % IJ SOLN
INTRAMUSCULAR | Status: AC
  Filled 2023-02-25: qty 50

## 2023-02-25 MED ORDER — AMOXICILLIN-POT CLAVULANATE 875-125 MG PO TABS: 1.0000 | ORAL_TABLET | Freq: Two times a day (BID) | ORAL | 0 refills | Status: DC

## 2023-02-25 NOTE — Discharge Instructions (Signed)
It was a pleasure taking care of you today.  As discussed, your CT scan showed possible pneumonia.  I am sending you home with 2 different antibiotics.  Take as prescribed and finish all antibiotics.  Please follow-up with PCP within the next few days for further evaluation.  Your CT scan also showed some heart strain.  Please follow-up with cardiology at your scheduled appointment for further evaluation.  Return to the ER for new or worsening symptoms.

## 2023-02-25 NOTE — ED Triage Notes (Addendum)
C/o centralized cp with pressure and described as "I am unable to get a full breath in" with n/v x 1 week. Pt reports had a cough before cp started  Denies radiation.  Pt reports recently finding lung nodules.  Hx daily smoker, copd.

## 2023-02-25 NOTE — ED Provider Notes (Signed)
Care assumed from Domenic Moras, PA-C at shift change pending CTA chest. See his note for full HPI.  In short, patient is a 57 year old female with a history of COPD who presents to the ED due to chest pain and persistent cough.  She also endorses shortness of breath.  Admits to tobacco use.  Denies wheezing.  No history of DVT/PE.  Plan from previous provider, follow-up CTA.  If negative for PE treat for pneumonia. Previous provider recommended holding off on steroids given no wheeze on exam.  Physical Exam  BP 125/84 (BP Location: Right Arm)   Pulse (!) 55   Temp 97.8 F (36.6 C) (Oral)   Resp 18   Ht 5\' 4"  (1.626 m)   Wt 84.4 kg   SpO2 100%   BMI 31.93 kg/m   Physical Exam Vitals and nursing note reviewed.  Constitutional:      General: She is not in acute distress.    Appearance: She is not ill-appearing.  HENT:     Head: Normocephalic.  Eyes:     Pupils: Pupils are equal, round, and reactive to light.  Cardiovascular:     Rate and Rhythm: Normal rate and regular rhythm.     Pulses: Normal pulses.     Heart sounds: Normal heart sounds. No murmur heard.    No friction rub. No gallop.  Pulmonary:     Effort: Pulmonary effort is normal.     Breath sounds: Normal breath sounds.  Abdominal:     General: Abdomen is flat. There is no distension.     Palpations: Abdomen is soft.     Tenderness: There is no abdominal tenderness. There is no guarding or rebound.  Musculoskeletal:        General: Normal range of motion.     Cervical back: Neck supple.  Skin:    General: Skin is warm and dry.  Neurological:     General: No focal deficit present.     Mental Status: She is alert.  Psychiatric:        Mood and Affect: Mood normal.        Behavior: Behavior normal.     Procedures  Procedures  ED Course / MDM    Medical Decision Making Amount and/or Complexity of Data Reviewed Labs: ordered. Radiology: ordered.  Risk Prescription drug management.   Care assumed from  Domenic Moras, PA-C at shift change pending CTA chest. See his note for full MDM.  D29-year-old female presents to the ED due to persistent cough x 2 months.  She also admits to shortness of breath and chest pain for the past few days.  History of COPD.  Denies wheeze.  No fever.  At shift change, patient pending CTA chest.  CBC unremarkable.  No leukocytosis.  Normal hemoglobin.  COVID/influenza negative.  BMP unremarkable.  Chest x-ray demonstrates opacities likely infectious/inflammatory process.  CTA chest demonstrates: "There is no evidence of central pulmonary artery embolism. There is no evidence of thoracic aortic dissection. There is reflux of contrast into hepatic veins suggesting right heart strain.  Increased interstitial markings and cystic spaces are noted in both upper lung fields suggesting scarring. Possibility of superimposed interstitial pneumonia is not excluded. There are scattered subcentimeter nodular densities in mid and lower lung fields suggesting scarring or interstitial pneumonia. There is no focal pulmonary consolidation.  Small pericardial effusion. There is no pneumothorax. Minimal bilateral pleural effusions.  Small hiatal hernia."   5:33 PM reassessed patient at bedside.  Patient admits  to continued chest pain that has been persistent for the past few days.  CTA negative for PE but does demonstrate some right heart strain.  Added troponin.  Does demonstrate possible pneumonia.  Patient notes cough has been present for the past 2 months.  Chest pain developed a few days ago associated with shortness of breath.  History of COPD.  No wheezing on exam. No evidence of fluid overload on exam.  Troponin normal. EKG NSR, no signs of acute ischemia. Low suspicion for ACS.  Will cover patient with antibiotics for community-acquired pneumonia.  Advised patient findings of heart strain.  Patient has a cardiology appointment next week.  Advised patient to report to scheduled  appointment.  Recommended patient follow-up with PCP within the next few days for recheck. Strict ED precautions discussed with patient. Patient states understanding and agrees to plan. Patient discharged home in no acute distress and stable vitals     Karie Kirks 02/25/23 Estella Husk, MD 02/26/23 [phone]

## 2023-02-25 NOTE — ED Notes (Signed)
Patient states she is leaving due to wait time and request IV be removed explained to patient waiting on lab to result patient verbalized understanding and request that dr write her rx for antibiotic that was discussed. Provider made aware

## 2023-02-25 NOTE — ED Provider Notes (Signed)
[facility] Provider Note   CSN: [id number] Arrival date & time: 02/25/23  1011     History  Chief Complaint  Patient presents with   Cough    Brittney Sanford is a 57 y.o. female.  The history is provided by the patient and medical records. No language interpreter was used.  Cough    57 year old female significant history of COPD, GERD, anxiety, asthma, presenting complaining of chest discomfort.  Patient report for the past week she has had a recurrent cough and now productive with sputum.  She endorsed having shortness of breath and unable to get a full breath when she breathes in.  She endorsed decreased appetite, not feeling well, and quite a bit of pressure in her chest.  She was recently noted to have pulmonary lung nodules on CT scan done several months ago.  She admits to heavy tobacco use approximately a pack and half of cigarettes daily but have significantly decrease her tobacco intake.  She does not endorse any wheezing no chest pain or abdominal pain no runny nose or sore throat.  No prior history of PE or DVT no recent surgery prolonged bedrest leg swelling or calf pain.  Home Medications Prior to Admission medications   Medication Sig Start Date End Date Taking? Authorizing Provider  albuterol (PROVENTIL) (2.5 MG/3ML) 0.083% nebulizer solution Take 2.5 mg by nebulization 4 (four) times daily as needed for wheezing or shortness of breath.    [provider]  albuterol (VENTOLIN HFA) 108 (90 Base) MCG/ACT inhaler Inhale 2 puffs into the lungs every 6 (six) hours as needed for wheezing or shortness of breath.    [provider]  ALPRAZolam Duanne Moron) 0.5 MG tablet Take 0.5 mg by mouth 2 (two) times daily as needed for anxiety. 07/09/20   [provider]  benzonatate (TESSALON) 100 MG capsule Take 1 capsule (100 mg total) by mouth every 8 (eight) hours. 11/02/22   Schutt, Grafton Folk, PA-C  Calcium  Citrate-Vitamin D (CELEBRATE CALCIUM PLUS 500) 500-333 MG-UNIT CHEW Chew 1 tablet by mouth in the morning, at noon, in the evening, and at bedtime.    [provider]  clotrimazole-betamethasone (LOTRISONE) cream Apply to affected area 2 times daily prn 06/16/21   Pattricia Boss, MD  cyclobenzaprine (FLEXERIL) 10 MG tablet Take 10 mg by mouth 3 (three) times daily as needed for muscle spasms.    [provider]  esomeprazole (NEXIUM) 40 MG capsule Take 40 mg by mouth daily. 12/06/22   [provider]  fluticasone (FLONASE) 50 MCG/ACT nasal spray Place 1 spray into both nostrils daily. 11/02/22   Schutt, Grafton Folk, PA-C  gabapentin (NEURONTIN) 300 MG capsule Take 300 mg by mouth 3 (three) times daily as needed (nerve pain).    [provider]  loratadine (CLARITIN) 10 MG tablet Take 1 tablet (10 mg total) by mouth daily. 11/02/22   Schutt, Grafton Folk, PA-C  Multiple Vitamins-Minerals (BARIATRIC MULTIVITAMINS/IRON PO) Take 1 tablet by mouth daily. W/45 mg IRON    [provider]  oxyCODONE-acetaminophen (PERCOCET) 10-325 MG tablet Take 1 tablet by mouth every 4 (four) hours as needed for pain.    [provider]  SUMAtriptan (IMITREX) 100 MG tablet Take 100 mg by mouth every 2 (two) hours as needed for migraine. 10/08/20   [provider]  TRULANCE 3 MG TABS Take 1 tablet by mouth daily.    [provider]      Allergies  Lithium and Aspirin    Review of Systems   Review of Systems  Respiratory:  Positive for cough.   All other systems reviewed and are negative.   Physical Exam Updated Vital Signs BP (!) 138/100 (BP Location: Left Arm)   Pulse (!) 59   Temp 97.9 F (36.6 C) (Oral)   Resp 18   Wt 86 kg   SpO2 100%   BMI 32.54 kg/m  Physical Exam Vitals and nursing note reviewed.  Constitutional:      General: She is not in acute distress.    Appearance: She is well-developed.  HENT:     Head: Atraumatic.  Eyes:      Conjunctiva/sclera: Conjunctivae normal.  Cardiovascular:     Rate and Rhythm: Normal rate and regular rhythm.     Pulses: Normal pulses.     Heart sounds: Normal heart sounds.  Pulmonary:     Effort: Pulmonary effort is normal.     Breath sounds: No wheezing, rhonchi or rales.  Abdominal:     Palpations: Abdomen is soft.     Tenderness: There is no abdominal tenderness.  Musculoskeletal:     Cervical back: Neck supple.     Right lower leg: No edema.     Left lower leg: No edema.  Skin:    Findings: No rash.  Neurological:     Mental Status: She is alert.  Psychiatric:        Mood and Affect: Mood normal.     ED Results / Procedures / Treatments   Labs (all labs ordered are listed, but only abnormal results are displayed) Labs Reviewed  RESP PANEL BY RT-PCR (RSV, FLU A&B, COVID)  [id number]  BASIC METABOLIC PANEL  CBC WITH DIFFERENTIAL/PLATELET    EKG EKG Interpretation  Date/Time:  Wednesday February 25 2023 10:22:05 EDT Ventricular Rate:  58 PR Interval:  172 QRS Duration: 93 QT Interval:  419 QTC Calculation: 412 R Axis:   33 Text Interpretation: Sinus rhythm Confirmed by Lennice Sites ([id number]) on 02/25/2023 10:30:16 AM  Radiology DG Chest Portable 1 View  Result Date: 02/25/2023 CLINICAL DATA:  Cough EXAM: PORTABLE CHEST 1 VIEW COMPARISON:  Radiograph 02/22/2023 FINDINGS: Unchanged cardiomediastinal silhouette. There are reticulonodular opacities bilaterally, most prominent in the upper lobes, slightly improved on the right in comparison to prior. No large effusion or evidence of pneumothorax. The no acute osseous abnormality. IMPRESSION: Mild reticulonodular opacities most prominent in the upper lobe lobes, slightly improved in the right upper lobe in comparison to prior, likely representing an infectious/inflammatory process. Recommend PA and lateral chest radiograph in 6-12 weeks to ensure resolution. Electronically Signed   By: Maurine Simmering M.D.   On: 02/25/2023  10:48    Procedures Procedures    Medications Ordered in ED Medications  albuterol (VENTOLIN HFA) 108 (90 Base) MCG/ACT inhaler 2 puff (has no administration in time range)  benzonatate (TESSALON) capsule 100 mg (100 mg Oral Given 02/25/23 1315)    ED Course/ Medical Decision Making/ A&P                             Medical Decision Making Amount and/or Complexity of Data Reviewed Labs: ordered. Radiology: ordered.  Risk Prescription drug management.   BP (!) 138/100 (BP Location: Left Arm)   Pulse (!) 59   Temp 97.9 F (36.6 C) (Oral)   Resp 18   Wt 86 kg   SpO2 100%   BMI 32.54  kg/m   39:28 PM 57 year old female significant history of COPD, GERD, anxiety, asthma, presenting complaining of chest discomfort.  Patient report for the past week she has had a recurrent cough and now productive with sputum.  She endorsed having shortness of breath and unable to get a full breath when she breathes in.  She endorsed decreased appetite, not feeling well, and quite a bit of pressure in her chest.  She was recently noted to have pulmonary lung nodules on CT scan done several months ago.  She admits to heavy tobacco use approximately a pack and half of cigarettes daily but have significantly decrease her tobacco intake.  She does not endorse any wheezing no chest pain or abdominal pain no runny nose or sore throat.  No prior history of PE or DVT no recent surgery prolonged bedrest leg swelling or calf pain.  On exam, patient is resting comfortably in bed appears to be in no acute discomfort.  Heart with normal rate and rhythm lungs are clear to auscultation bilaterally abdomen is soft nontender no peripheral edema noted.  Vital signs overall reassuring no hypoxia no fever.  Chest x-ray obtained independently viewed interpreted by me which shows reticulonodular opacity in the upper lobes likely represents infectious/inflammatory process.  Given history of pulmonary nodules, worsening  shortness of breath and recurrent cough, I will obtain chest CT angio to rule out PE, if negative, will treat for pneumonia with antibiotic.  -Labs ordered, independently viewed and interpreted by me.  Labs remarkable for normal WBC, normal H&H, electrolytes panel are reassuring, negative covid/flu/rsv -The patient was maintained on a cardiac monitor.  I personally viewed and interpreted the cardiac monitored which showed an underlying rhythm of: sinus bradycardia -Imaging independently viewed and interpreted by me and I agree with radiologist's interpretation.  Result remarkable for CXR showing opacities in the upper lobes likely infectious/inflammatory process -This patient presents to the ED for concern of cough, this involves an extensive number of treatment options, and is a complaint that carries with it a high risk of complications and morbidity.  The differential diagnosis includes pneumonia, pe, pleurisy, copd, asthma, drug induced cough, viral illness -Co morbidities that complicate the patient evaluation includes tobacco use, asthma -Treatment includes tessalon, albuterol HFA -Reevaluation of the patient after these medicines showed that the patient improved -PCP office notes or outside notes reviewed  Patient signed out to oncoming provider who will follow-up on chest CT angiogram.  If CT without evidence of PE, patient can be discharged home with antibiotic which may include amoxicillin, doxycycline to cover for community-acquired pneumonia.        Final Clinical Impression(s) / ED Diagnoses Final diagnoses:  None    Rx / DC Orders ED Discharge Orders     None         Domenic Moras, PA-C 02/25/23 1513    Lennice Sites, DO 02/25/23 1735

## 2023-04-09 ENCOUNTER — Encounter: Payer: Self-pay | Admitting: Pulmonary Disease

## 2023-04-09 ENCOUNTER — Ambulatory Visit (INDEPENDENT_AMBULATORY_CARE_PROVIDER_SITE_OTHER): Payer: Medicare HMO | Admitting: Pulmonary Disease

## 2023-04-09 VITALS — BP 120/90 | HR 57 | Ht 64.0 in | Wt 189.0 lb

## 2023-04-09 DIAGNOSIS — Z716 Tobacco abuse counseling: Secondary | ICD-10-CM

## 2023-04-09 DIAGNOSIS — R918 Other nonspecific abnormal finding of lung field: Secondary | ICD-10-CM | POA: Diagnosis not present

## 2023-04-09 DIAGNOSIS — J849 Interstitial pulmonary disease, unspecified: Secondary | ICD-10-CM

## 2023-04-09 MED ORDER — BREZTRI AEROSPHERE 160-9-4.8 MCG/ACT IN AERO: 2.0000 | INHALATION_SPRAY | Freq: Two times a day (BID) | RESPIRATORY_TRACT | 6 refills | Status: AC

## 2023-04-09 MED ORDER — ALBUTEROL SULFATE (2.5 MG/3ML) 0.083% IN NEBU: 2.5000 mg | INHALATION_SOLUTION | Freq: Four times a day (QID) | RESPIRATORY_TRACT | 12 refills | Status: AC | PRN

## 2023-04-09 MED ORDER — BUPROPION HCL ER (SR) 150 MG PO TB12: 150.0000 mg | ORAL_TABLET | Freq: Two times a day (BID) | ORAL | 2 refills | Status: DC

## 2023-04-09 NOTE — Progress Notes (Signed)
Synopsis: Referred in May 2024 for pulmonary nodules by Karle Plumber, MD  Subjective:   PATIENT ID: Brittney Sanford GENDER: female DOB: [birth year], MRN: [id number]  Chief Complaint  Patient presents with   Consult    Pulm nodule, COPD, Emphysema     This is a 57 year old female seen today with a history of asthma, COPD.  No prior PFTs per patient.  Longstanding history of tobacco use.  At her max was smoking 2 packs/day.  She is referred for evaluation of her underlying emphysema COPD asthma.  No family history of sarcoidosis.  She has a history of rheumatoid arthritis.  Not on any medications regarding this at this time.  She used to follow with pulmonologist at [facility].  Do not have these records to review today.  She does have shortness of breath with exertion.  CT imaging of the chest completed during her recent hospital visit in March 2024 which reveals upper lobe predominant cystic changes and interstitial changes with scattered pulmonary nodules.  All small subcentimeter in size.      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    Rheumatoid arthritis (HCC)    Sleep apnea    CPAP      Family History  Problem Relation Age of Onset   COPD Mother    Emphysema Mother    Hypertension Father      Past Surgical History:  Procedure Laterality Date   ABDOMINAL HYSTERECTOMY     APPENDECTOMY     BACK SURGERY     CHOLECYSTECTOMY     LAPAROSCOPIC GASTRIC SLEEVE RESECTION N/A 01/07/2021   Procedure: LAPAROSCOPIC GASTRIC SLEEVE RESECTION AND HIATAL HERNIA REPAIR;  Surgeon: Luretha Murphy, MD;  Location: [facility];  Service: General;  Laterality: N/A;   OVARIAN CYST REMOVAL     UPPER GI ENDOSCOPY N/A  01/07/2021   Procedure: UPPER GI ENDOSCOPY;  Surgeon: Luretha Murphy, MD;  Location: [facility];  Service: General;  Laterality: N/A;    Social History   Socioeconomic History   Marital status: Single    Spouse name: Not on file   Number of children: Not on file   Years of education: Not on file   Highest education level: Not on file  Occupational History   Not on file  Tobacco Use   Smoking status: Light Smoker    Packs/day: 1    Types: Cigarettes    Last attempt to quit: 10/16/2019    Years since quitting: 3.4   Smokeless tobacco: Never   Tobacco comments:    Smokes 5 cigarettes a day. 04/09/23 Tay  Vaping Use   Vaping Use: Never used  Substance and Sexual Activity   Alcohol use: No   Drug use: No    Frequency: 2.0 times per week   Sexual activity: Yes    Birth control/protection: None  Other Topics Concern   Not on file  Social History Narrative   Not on file   Social Determinants of Health   Financial Resource Strain: Not on file  Food Insecurity: Not on file  Transportation Needs: Not on file  Physical Activity:  Not on file  Stress: Not on file  Social Connections: Not on file  Intimate Partner Violence: Not on file     Allergies  Allergen Reactions   Lithium Shortness Of Breath, Nausea And Vomiting, Nausea Only and Rash    Difficulty breathing   Aspirin Rash     Outpatient Medications Prior to Visit  Medication Sig Dispense Refill   albuterol (VENTOLIN HFA) 108 (90 Base) MCG/ACT inhaler Inhale 2 puffs into the lungs every 6 (six) hours as needed for wheezing or shortness of breath.     ALPRAZolam (XANAX) 0.5 MG tablet Take 0.5 mg by mouth 2 (two) times daily as needed for anxiety.     amoxicillin-clavulanate (AUGMENTIN) 875-125 MG tablet Take 1 tablet by mouth every 12 (twelve) hours. 14 tablet 0   azithromycin (ZITHROMAX) 250 MG tablet Take 1 tablet (250 mg total) by mouth daily. Take first 2 tablets together, then 1 every day until finished. 6 tablet 0    benzonatate (TESSALON) 100 MG capsule Take 1 capsule (100 mg total) by mouth every 8 (eight) hours. 21 capsule 0   Calcium Citrate-Vitamin D (CELEBRATE CALCIUM PLUS 500) 500-333 MG-UNIT CHEW Chew 1 tablet by mouth in the morning, at noon, in the evening, and at bedtime.     clotrimazole-betamethasone (LOTRISONE) cream Apply to affected area 2 times daily prn 15 g 0   cyclobenzaprine (FLEXERIL) 10 MG tablet Take 10 mg by mouth 3 (three) times daily as needed for muscle spasms.     esomeprazole (NEXIUM) 40 MG capsule Take 40 mg by mouth daily.     fluticasone (FLONASE) 50 MCG/ACT nasal spray Place 1 spray into both nostrils daily. 15.8 mL 2   gabapentin (NEURONTIN) 300 MG capsule Take 300 mg by mouth 3 (three) times daily as needed (nerve pain).     loratadine (CLARITIN) 10 MG tablet Take 1 tablet (10 mg total) by mouth daily. 30 tablet 0   Multiple Vitamins-Minerals (BARIATRIC MULTIVITAMINS/IRON PO) Take 1 tablet by mouth daily. W/45 mg IRON     oxyCODONE-acetaminophen (PERCOCET) 10-325 MG tablet Take 1 tablet by mouth every 4 (four) hours as needed for pain.     SUMAtriptan (IMITREX) 100 MG tablet Take 100 mg by mouth every 2 (two) hours as needed for migraine.     TRULANCE 3 MG TABS Take 1 tablet by mouth daily.     albuterol (PROVENTIL) (2.5 MG/3ML) 0.083% nebulizer solution Take 2.5 mg by nebulization 4 (four) times daily as needed for wheezing or shortness of breath.     No facility-administered medications prior to visit.    Review of Systems  Constitutional:  Negative for chills, fever, malaise/fatigue and weight loss.  HENT:  Negative for hearing loss, sore throat and tinnitus.   Eyes:  Negative for blurred vision and double vision.  Respiratory:  Positive for cough and shortness of breath. Negative for hemoptysis, sputum production, wheezing and stridor.   Cardiovascular:  Negative for chest pain, palpitations, orthopnea, leg swelling and PND.  Gastrointestinal:  Negative for abdominal  pain, constipation, diarrhea, heartburn, nausea and vomiting.  Genitourinary:  Negative for dysuria, hematuria and urgency.  Musculoskeletal:  Negative for joint pain and myalgias.  Skin:  Negative for itching and rash.  Neurological:  Negative for dizziness, tingling, weakness and headaches.  Endo/Heme/Allergies:  Negative for environmental allergies. Does not bruise/bleed easily.  Psychiatric/Behavioral:  Negative for depression. The patient is not nervous/anxious and does not have insomnia.   All other systems reviewed and are  negative.    Objective:  Physical Exam Vitals reviewed.  Constitutional:      General: She is not in acute distress.    Appearance: She is well-developed. She is obese.  HENT:     Head: Normocephalic and atraumatic.  Eyes:     General: No scleral icterus.    Conjunctiva/sclera: Conjunctivae normal.     Pupils: Pupils are equal, round, and reactive to light.  Neck:     Vascular: No JVD.     Trachea: No tracheal deviation.  Cardiovascular:     Rate and Rhythm: Normal rate and regular rhythm.     Heart sounds: Normal heart sounds. No murmur heard. Pulmonary:     Effort: Pulmonary effort is normal. No tachypnea, accessory muscle usage or respiratory distress.     Breath sounds: No stridor. No wheezing, rhonchi or rales.  Abdominal:     General: There is no distension.     Palpations: Abdomen is soft.     Tenderness: There is no abdominal tenderness.  Musculoskeletal:        General: No tenderness.     Cervical back: Neck supple.  Lymphadenopathy:     Cervical: No cervical adenopathy.  Skin:    General: Skin is warm and dry.     Capillary Refill: Capillary refill takes less than 2 seconds.     Findings: No rash.  Neurological:     Mental Status: She is alert and oriented to person, place, and time.  Psychiatric:        Behavior: Behavior normal.      Vitals:   04/09/23 1000  BP: (!) 120/90  Pulse: (!) 57  SpO2: 100%  Weight: 189 lb (85.7  kg)  Height: 5\' 4"  (1.626 m)   100% on RA BMI Readings from Last 3 Encounters:  04/09/23 32.44 kg/m  02/25/23 31.93 kg/m  12/23/22 32.61 kg/m   Wt Readings from Last 3 Encounters:  04/09/23 189 lb (85.7 kg)  02/25/23 186 lb (84.4 kg)  12/23/22 190 lb (86.2 kg)     CBC    Component Value Date/Time   WBC 7.9 02/25/2023 1331   RBC 4.38 02/25/2023 1331   HGB 13.0 02/25/2023 1331   HCT 39.1 02/25/2023 1331   PLT 192 02/25/2023 1331   MCV 89.3 02/25/2023 1331   MCH 29.7 02/25/2023 1331   MCHC 33.2 02/25/2023 1331   RDW 14.4 02/25/2023 1331   LYMPHSABS 2.8 02/25/2023 1331   MONOABS 0.4 02/25/2023 1331   EOSABS 0.1 02/25/2023 1331   BASOSABS 0.1 02/25/2023 1331     Chest Imaging:  CT chest March 2024: Upper lobe predominant cystic lesions, emphysema, scattered pulmonary nodules, groundglass attenuation. The patient's images have been independently reviewed by me.    Pulmonary Functions Testing Results:     No data to display          FeNO:   Pathology:   Echocardiogram:   Heart Catheterization:     Assessment & Plan:     ICD-10-CM   1. ILD (interstitial lung disease) (HCC)  J84.9 Pulmonary Function Test    2. Multiple lung nodules  R91.8 Pulmonary Function Test    3. Encounter for smoking cessation counseling  Z71.6 Pulmonary Function Test      Discussion:  This is a 57 year old female with abnormal CT imaging.  Not exactly sure what is going on but it does appear that she has upper lobe predominant interstitial changes.  This may be smoking-related lung disease such  as RB ILD.  With the irregular cystic shapes we could be dealing with Langerhans cell but this is upper lobe predominant in her case.  Other etiologies include just respiratory bronchiolitis on top of emphysema.  Plan: Ron get pulmonary function test to start. Start patient on Breztri to see if this helps with her symptomatology. Counseled heavily on smoking cessation today in the  office. RTC in a few weeks after PFTs complete. Also given Wellbutrin to help stop smoking.    Current Outpatient Medications:    albuterol (PROVENTIL) (2.5 MG/3ML) 0.083% nebulizer solution, Take 3 mLs (2.5 mg total) by nebulization every 6 (six) hours as needed for wheezing or shortness of breath., Disp: 75 mL, Rfl: 12   albuterol (VENTOLIN HFA) 108 (90 Base) MCG/ACT inhaler, Inhale 2 puffs into the lungs every 6 (six) hours as needed for wheezing or shortness of breath., Disp: , Rfl:    ALPRAZolam (XANAX) 0.5 MG tablet, Take 0.5 mg by mouth 2 (two) times daily as needed for anxiety., Disp: , Rfl:    amoxicillin-clavulanate (AUGMENTIN) 875-125 MG tablet, Take 1 tablet by mouth every 12 (twelve) hours., Disp: 14 tablet, Rfl: 0   azithromycin (ZITHROMAX) 250 MG tablet, Take 1 tablet (250 mg total) by mouth daily. Take first 2 tablets together, then 1 every day until finished., Disp: 6 tablet, Rfl: 0   benzonatate (TESSALON) 100 MG capsule, Take 1 capsule (100 mg total) by mouth every 8 (eight) hours., Disp: 21 capsule, Rfl: 0   Budeson-Glycopyrrol-Formoterol (BREZTRI AEROSPHERE) 160-9-4.8 MCG/ACT AERO, Inhale 2 puffs into the lungs in the morning and at bedtime., Disp: 10.7 g, Rfl: 6   buPROPion (WELLBUTRIN SR) 150 MG 12 hr tablet, Take 1 tablet (150 mg total) by mouth 2 (two) times daily. One tab per day for 3 days then increase to twice daily, Disp: 60 tablet, Rfl: 2   Calcium Citrate-Vitamin D (CELEBRATE CALCIUM PLUS 500) 500-333 MG-UNIT CHEW, Chew 1 tablet by mouth in the morning, at noon, in the evening, and at bedtime., Disp: , Rfl:    clotrimazole-betamethasone (LOTRISONE) cream, Apply to affected area 2 times daily prn, Disp: 15 g, Rfl: 0   cyclobenzaprine (FLEXERIL) 10 MG tablet, Take 10 mg by mouth 3 (three) times daily as needed for muscle spasms., Disp: , Rfl:    esomeprazole (NEXIUM) 40 MG capsule, Take 40 mg by mouth daily., Disp: , Rfl:    fluticasone (FLONASE) 50 MCG/ACT nasal  spray, Place 1 spray into both nostrils daily., Disp: 15.8 mL, Rfl: 2   gabapentin (NEURONTIN) 300 MG capsule, Take 300 mg by mouth 3 (three) times daily as needed (nerve pain)., Disp: , Rfl:    loratadine (CLARITIN) 10 MG tablet, Take 1 tablet (10 mg total) by mouth daily., Disp: 30 tablet, Rfl: 0   Multiple Vitamins-Minerals (BARIATRIC MULTIVITAMINS/IRON PO), Take 1 tablet by mouth daily. W/45 mg IRON, Disp: , Rfl:    oxyCODONE-acetaminophen (PERCOCET) 10-325 MG tablet, Take 1 tablet by mouth every 4 (four) hours as needed for pain., Disp: , Rfl:    SUMAtriptan (IMITREX) 100 MG tablet, Take 100 mg by mouth every 2 (two) hours as needed for migraine., Disp: , Rfl:    TRULANCE 3 MG TABS, Take 1 tablet by mouth daily., Disp: , Rfl:    Josephine Igo, DO [facility] 04/09/2023 10:39 AM

## 2023-04-09 NOTE — H&P (View-Only) (Signed)
 Synopsis: Referred in May 2024 for pulmonary nodules by Arvind, Moogali M, MD  Subjective:   PATIENT ID: Brittney Sanford GENDER: female DOB: 01/24/1966, MRN: [id number]  Chief Complaint  Patient presents with   Consult    Pulm nodule, COPD, Emphysema     This is a 57-year-old female seen today with a history of asthma, COPD.  No prior PFTs per patient.  Longstanding history of tobacco use.  At her max was smoking 2 packs/day.  She is referred for evaluation of her underlying emphysema COPD asthma.  No family history of sarcoidosis.  She has a history of rheumatoid arthritis.  Not on any medications regarding this at this time.  She used to follow with pulmonologist at [facility].  Do not have these records to review today.  She does have shortness of breath with exertion.  CT imaging of the chest completed during her recent hospital visit in March 2024 which reveals upper lobe predominant cystic changes and interstitial changes with scattered pulmonary nodules.  All small subcentimeter in size.      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    Rheumatoid arthritis (HCC)    Sleep apnea    CPAP      Family History  Problem Relation Age of Onset   COPD Mother    Emphysema Mother    Hypertension Father      Past Surgical History:  Procedure Laterality Date   ABDOMINAL HYSTERECTOMY     APPENDECTOMY     BACK SURGERY     CHOLECYSTECTOMY     LAPAROSCOPIC GASTRIC SLEEVE RESECTION N/A 01/07/2021   Procedure: LAPAROSCOPIC GASTRIC SLEEVE RESECTION AND HIATAL HERNIA REPAIR;  Surgeon: Martin, Matthew, MD;  Location: [facility];  Service: General;  Laterality: N/A;   OVARIAN CYST REMOVAL     UPPER GI ENDOSCOPY N/A  01/07/2021   Procedure: UPPER GI ENDOSCOPY;  Surgeon: Martin, Matthew, MD;  Location: [facility];  Service: General;  Laterality: N/A;    Social History   Socioeconomic History   Marital status: Single    Spouse name: Not on file   Number of children: Not on file   Years of education: Not on file   Highest education level: Not on file  Occupational History   Not on file  Tobacco Use   Smoking status: Light Smoker    Packs/day: 1    Types: Cigarettes    Last attempt to quit: 10/16/2019    Years since quitting: 3.4   Smokeless tobacco: Never   Tobacco comments:    Smokes 5 cigarettes a day. 04/09/23 Tay  Vaping Use   Vaping Use: Never used  Substance and Sexual Activity   Alcohol use: No   Drug use: No    Frequency: 2.0 times per week   Sexual activity: Yes    Birth control/protection: None  Other Topics Concern   Not on file  Social History Narrative   Not on file   Social Determinants of Health   Financial Resource Strain: Not on file  Food Insecurity: Not on file  Transportation Needs: Not on file  Physical Activity:   Not on file  Stress: Not on file  Social Connections: Not on file  Intimate Partner Violence: Not on file     Allergies  Allergen Reactions   Lithium Shortness Of Breath, Nausea And Vomiting, Nausea Only and Rash    Difficulty breathing   Aspirin Rash     Outpatient Medications Prior to Visit  Medication Sig Dispense Refill   albuterol (VENTOLIN HFA) 108 (90 Base) MCG/ACT inhaler Inhale 2 puffs into the lungs every 6 (six) hours as needed for wheezing or shortness of breath.     ALPRAZolam (XANAX) 0.5 MG tablet Take 0.5 mg by mouth 2 (two) times daily as needed for anxiety.     amoxicillin-clavulanate (AUGMENTIN) 875-125 MG tablet Take 1 tablet by mouth every 12 (twelve) hours. 14 tablet 0   azithromycin (ZITHROMAX) 250 MG tablet Take 1 tablet (250 mg total) by mouth daily. Take first 2 tablets together, then 1 every day until finished. 6 tablet 0    benzonatate (TESSALON) 100 MG capsule Take 1 capsule (100 mg total) by mouth every 8 (eight) hours. 21 capsule 0   Calcium Citrate-Vitamin D (CELEBRATE CALCIUM PLUS 500) 500-333 MG-UNIT CHEW Chew 1 tablet by mouth in the morning, at noon, in the evening, and at bedtime.     clotrimazole-betamethasone (LOTRISONE) cream Apply to affected area 2 times daily prn 15 g 0   cyclobenzaprine (FLEXERIL) 10 MG tablet Take 10 mg by mouth 3 (three) times daily as needed for muscle spasms.     esomeprazole (NEXIUM) 40 MG capsule Take 40 mg by mouth daily.     fluticasone (FLONASE) 50 MCG/ACT nasal spray Place 1 spray into both nostrils daily. 15.8 mL 2   gabapentin (NEURONTIN) 300 MG capsule Take 300 mg by mouth 3 (three) times daily as needed (nerve pain).     loratadine (CLARITIN) 10 MG tablet Take 1 tablet (10 mg total) by mouth daily. 30 tablet 0   Multiple Vitamins-Minerals (BARIATRIC MULTIVITAMINS/IRON PO) Take 1 tablet by mouth daily. W/45 mg IRON     oxyCODONE-acetaminophen (PERCOCET) 10-325 MG tablet Take 1 tablet by mouth every 4 (four) hours as needed for pain.     SUMAtriptan (IMITREX) 100 MG tablet Take 100 mg by mouth every 2 (two) hours as needed for migraine.     TRULANCE 3 MG TABS Take 1 tablet by mouth daily.     albuterol (PROVENTIL) (2.5 MG/3ML) 0.083% nebulizer solution Take 2.5 mg by nebulization 4 (four) times daily as needed for wheezing or shortness of breath.     No facility-administered medications prior to visit.    Review of Systems  Constitutional:  Negative for chills, fever, malaise/fatigue and weight loss.  HENT:  Negative for hearing loss, sore throat and tinnitus.   Eyes:  Negative for blurred vision and double vision.  Respiratory:  Positive for cough and shortness of breath. Negative for hemoptysis, sputum production, wheezing and stridor.   Cardiovascular:  Negative for chest pain, palpitations, orthopnea, leg swelling and PND.  Gastrointestinal:  Negative for abdominal  pain, constipation, diarrhea, heartburn, nausea and vomiting.  Genitourinary:  Negative for dysuria, hematuria and urgency.  Musculoskeletal:  Negative for joint pain and myalgias.  Skin:  Negative for itching and rash.  Neurological:  Negative for dizziness, tingling, weakness and headaches.  Endo/Heme/Allergies:  Negative for environmental allergies. Does not bruise/bleed easily.  Psychiatric/Behavioral:  Negative for depression. The patient is not nervous/anxious and does not have insomnia.   All other systems reviewed and are   negative.    Objective:  Physical Exam Vitals reviewed.  Constitutional:      General: She is not in acute distress.    Appearance: She is well-developed. She is obese.  HENT:     Head: Normocephalic and atraumatic.  Eyes:     General: No scleral icterus.    Conjunctiva/sclera: Conjunctivae normal.     Pupils: Pupils are equal, round, and reactive to light.  Neck:     Vascular: No JVD.     Trachea: No tracheal deviation.  Cardiovascular:     Rate and Rhythm: Normal rate and regular rhythm.     Heart sounds: Normal heart sounds. No murmur heard. Pulmonary:     Effort: Pulmonary effort is normal. No tachypnea, accessory muscle usage or respiratory distress.     Breath sounds: No stridor. No wheezing, rhonchi or rales.  Abdominal:     General: There is no distension.     Palpations: Abdomen is soft.     Tenderness: There is no abdominal tenderness.  Musculoskeletal:        General: No tenderness.     Cervical back: Neck supple.  Lymphadenopathy:     Cervical: No cervical adenopathy.  Skin:    General: Skin is warm and dry.     Capillary Refill: Capillary refill takes less than 2 seconds.     Findings: No rash.  Neurological:     Mental Status: She is alert and oriented to person, place, and time.  Psychiatric:        Behavior: Behavior normal.      Vitals:   04/09/23 1000  BP: (!) 120/90  Pulse: (!) 57  SpO2: 100%  Weight: 189 lb (85.7  kg)  Height: 5' 4" (1.626 m)   100% on RA BMI Readings from Last 3 Encounters:  04/09/23 32.44 kg/m  02/25/23 31.93 kg/m  12/23/22 32.61 kg/m   Wt Readings from Last 3 Encounters:  04/09/23 189 lb (85.7 kg)  02/25/23 186 lb (84.4 kg)  12/23/22 190 lb (86.2 kg)     CBC    Component Value Date/Time   WBC 7.9 02/25/2023 1331   RBC 4.38 02/25/2023 1331   HGB 13.0 02/25/2023 1331   HCT 39.1 02/25/2023 1331   PLT 192 02/25/2023 1331   MCV 89.3 02/25/2023 1331   MCH 29.7 02/25/2023 1331   MCHC 33.2 02/25/2023 1331   RDW 14.4 02/25/2023 1331   LYMPHSABS 2.8 02/25/2023 1331   MONOABS 0.4 02/25/2023 1331   EOSABS 0.1 02/25/2023 1331   BASOSABS 0.1 02/25/2023 1331     Chest Imaging:  CT chest March 2024: Upper lobe predominant cystic lesions, emphysema, scattered pulmonary nodules, groundglass attenuation. The patient's images have been independently reviewed by me.    Pulmonary Functions Testing Results:     No data to display          FeNO:   Pathology:   Echocardiogram:   Heart Catheterization:     Assessment & Plan:     ICD-10-CM   1. ILD (interstitial lung disease) (HCC)  J84.9 Pulmonary Function Test    2. Multiple lung nodules  R91.8 Pulmonary Function Test    3. Encounter for smoking cessation counseling  Z71.6 Pulmonary Function Test      Discussion:  This is a 57-year-old female with abnormal CT imaging.  Not exactly sure what is going on but it does appear that she has upper lobe predominant interstitial changes.  This may be smoking-related lung disease such   as RB ILD.  With the irregular cystic shapes we could be dealing with Langerhans cell but this is upper lobe predominant in her case.  Other etiologies include just respiratory bronchiolitis on top of emphysema.  Plan: Ron get pulmonary function test to start. Start patient on Breztri to see if this helps with her symptomatology. Counseled heavily on smoking cessation today in the  office. RTC in a few weeks after PFTs complete. Also given Wellbutrin to help stop smoking.    Current Outpatient Medications:    albuterol (PROVENTIL) (2.5 MG/3ML) 0.083% nebulizer solution, Take 3 mLs (2.5 mg total) by nebulization every 6 (six) hours as needed for wheezing or shortness of breath., Disp: 75 mL, Rfl: 12   albuterol (VENTOLIN HFA) 108 (90 Base) MCG/ACT inhaler, Inhale 2 puffs into the lungs every 6 (six) hours as needed for wheezing or shortness of breath., Disp: , Rfl:    ALPRAZolam (XANAX) 0.5 MG tablet, Take 0.5 mg by mouth 2 (two) times daily as needed for anxiety., Disp: , Rfl:    amoxicillin-clavulanate (AUGMENTIN) 875-125 MG tablet, Take 1 tablet by mouth every 12 (twelve) hours., Disp: 14 tablet, Rfl: 0   azithromycin (ZITHROMAX) 250 MG tablet, Take 1 tablet (250 mg total) by mouth daily. Take first 2 tablets together, then 1 every day until finished., Disp: 6 tablet, Rfl: 0   benzonatate (TESSALON) 100 MG capsule, Take 1 capsule (100 mg total) by mouth every 8 (eight) hours., Disp: 21 capsule, Rfl: 0   Budeson-Glycopyrrol-Formoterol (BREZTRI AEROSPHERE) 160-9-4.8 MCG/ACT AERO, Inhale 2 puffs into the lungs in the morning and at bedtime., Disp: 10.7 g, Rfl: 6   buPROPion (WELLBUTRIN SR) 150 MG 12 hr tablet, Take 1 tablet (150 mg total) by mouth 2 (two) times daily. One tab per day for 3 days then increase to twice daily, Disp: 60 tablet, Rfl: 2   Calcium Citrate-Vitamin D (CELEBRATE CALCIUM PLUS 500) 500-333 MG-UNIT CHEW, Chew 1 tablet by mouth in the morning, at noon, in the evening, and at bedtime., Disp: , Rfl:    clotrimazole-betamethasone (LOTRISONE) cream, Apply to affected area 2 times daily prn, Disp: 15 g, Rfl: 0   cyclobenzaprine (FLEXERIL) 10 MG tablet, Take 10 mg by mouth 3 (three) times daily as needed for muscle spasms., Disp: , Rfl:    esomeprazole (NEXIUM) 40 MG capsule, Take 40 mg by mouth daily., Disp: , Rfl:    fluticasone (FLONASE) 50 MCG/ACT nasal  spray, Place 1 spray into both nostrils daily., Disp: 15.8 mL, Rfl: 2   gabapentin (NEURONTIN) 300 MG capsule, Take 300 mg by mouth 3 (three) times daily as needed (nerve pain)., Disp: , Rfl:    loratadine (CLARITIN) 10 MG tablet, Take 1 tablet (10 mg total) by mouth daily., Disp: 30 tablet, Rfl: 0   Multiple Vitamins-Minerals (BARIATRIC MULTIVITAMINS/IRON PO), Take 1 tablet by mouth daily. W/45 mg IRON, Disp: , Rfl:    oxyCODONE-acetaminophen (PERCOCET) 10-325 MG tablet, Take 1 tablet by mouth every 4 (four) hours as needed for pain., Disp: , Rfl:    SUMAtriptan (IMITREX) 100 MG tablet, Take 100 mg by mouth every 2 (two) hours as needed for migraine., Disp: , Rfl:    TRULANCE 3 MG TABS, Take 1 tablet by mouth daily., Disp: , Rfl:    Geronimo Diliberto L Sayde Lish, DO [facility] 04/09/2023 10:39 AM    

## 2023-04-09 NOTE — Patient Instructions (Signed)
Thank you for visiting Dr. Tonia Brooms at [facility]. Today we recommend the following:  Orders Placed This Encounter  Procedures   Pulmonary Function Test   Meds ordered this encounter  Medications   albuterol (PROVENTIL) (2.5 MG/3ML) 0.083% nebulizer solution    Sig: Take 3 mLs (2.5 mg total) by nebulization every 6 (six) hours as needed for wheezing or shortness of breath.    Dispense:  75 mL    Refill:  12   buPROPion (WELLBUTRIN SR) 150 MG 12 hr tablet    Sig: Take 1 tablet (150 mg total) by mouth 2 (two) times daily. One tab per day for 3 days then increase to twice daily    Dispense:  60 tablet    Refill:  2   Budeson-Glycopyrrol-Formoterol (BREZTRI AEROSPHERE) 160-9-4.8 MCG/ACT AERO    Sig: Inhale 2 puffs into the lungs in the morning and at bedtime.    Dispense:  10.7 g    Refill:  6   Return in about 3 months (around 07/10/2023) for with APP or Dr. Tonia Brooms, after PFTs.    Please do your part to reduce the spread of COVID-19.

## 2023-04-10 ENCOUNTER — Telehealth: Payer: Self-pay | Admitting: Internal Medicine

## 2023-04-10 NOTE — Telephone Encounter (Signed)
Brad  Ddx is HX, Sarcoid, RB-ILD  Recommend  Bronch first > if non diagnostic -> SLB OR direct SLB based on shared decision making and safety/risk profile   2. If bronch or TTBx but with caveat 30-50% yield rate. Can the lab do "CD-1a and CD207 in tissue and BAL fluid," which is highly specific for HX

## 2023-04-13 NOTE — Telephone Encounter (Signed)
Left a voicemail for patient to call back and schedule phone visit for Brittney Robinsons, NP to discuss next steps in care.

## 2023-04-14 NOTE — Telephone Encounter (Signed)
Pt is scheduled for a televisit with SG today 5/14.

## 2023-04-15 ENCOUNTER — Ambulatory Visit (INDEPENDENT_AMBULATORY_CARE_PROVIDER_SITE_OTHER): Payer: Medicare HMO | Admitting: Acute Care

## 2023-04-15 ENCOUNTER — Other Ambulatory Visit: Payer: Self-pay | Admitting: Acute Care

## 2023-04-15 ENCOUNTER — Encounter: Payer: Self-pay | Admitting: Pulmonary Disease

## 2023-04-15 ENCOUNTER — Encounter: Payer: Self-pay | Admitting: Acute Care

## 2023-04-15 DIAGNOSIS — Z720 Tobacco use: Secondary | ICD-10-CM

## 2023-04-15 DIAGNOSIS — R918 Other nonspecific abnormal finding of lung field: Secondary | ICD-10-CM

## 2023-04-15 DIAGNOSIS — R911 Solitary pulmonary nodule: Secondary | ICD-10-CM

## 2023-04-15 NOTE — Progress Notes (Signed)
Virtual Visit via Telephone Note  I connected with Brittney Sanford on 04/15/23 at  2:00 PM EDT by telephone and verified that I am speaking with the correct person using two identifiers.  Location: Patient:  At home  Provider: [address] [place], [place], [address]    I discussed the limitations, risks, security and privacy concerns of performing an evaluation and management service by telephone and the availability of in person appointments. I also discussed with the patient that there may be a patient responsible charge related to this service. The patient expressed understanding and agreed to proceed.   Synopsis 57 year old female current every day smoker  with a history of asthma, COPD. No prior PFTs per patient. Longstanding history of tobacco use. No family history of sarcoidosis. She has a history of rheumatoid arthritis, but is not on any maintenance medications .  At her max was smoking 2 packs/day. She was referred for evaluation of her underlying emphysema COPD asthma. CT imaging of the chest completed during her recent hospital visit in March 2024  revealed upper lobe predominant cystic changes and interstitial changes with scattered pulmonary nodules.There is no history of family lung cancer. She was seen by Dr. Tonia Brooms, who 04/09/2023 who ordered PFT's, Started the patient on Breztri to see if this helps with her symptomatology. Counseled her heavily on smoking cessation , and started her on Welbutrin   Dr. Tonia Brooms also consulted with  Dr. Marchelle Gearing. He felt the differential diagnoses are HX, Sarcoid, Respiratory Bronchiolitis -ILD >> He advised Bronch first > if non diagnostic -> consider SLB OR direct SLB based on shared decision making and safety/risk profile.If bronch or TTBx but with caveat 30-50% yield rate. Can the lab do "CD-1a and CD207 in tissue and BAL fluid," which is highly specific for HX. She is on the phone  today to discuss option of bronchoscopy for definitive  diagnosis.     History of Present Illness: Pt. Presents for televisit to discuss bronchoscopy as an option for determining the cause of her shortness of breath. We discussed the option of bronchoscopy to better evaluate the changes in her lungs and dyspnea. She was scheduled for PFT's 04/09/2023 but did not have them done.  We discussed that Dr. Tonia Brooms has spoken with Dr. Marchelle Gearing, and they recommend bronchoscopy with biopsy to get a definitive diagnosis. Dr. 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 We reviewed risks of procedure to include bleeding, infection, and pneumothorax. . She understands that she will need someone to bring her and stay with her at [facility] while the procedure is being done, and afterwards at home until she has recovered from general anesthesia. She verbalized understanding. Plan is for 05/05/2023. She understands we will need to do a Super D CT Chest for planning and mapping prior to bronchoscopy     Observations/Objective: CT Chest March 2024: Upper lobe predominant cystic lesions, emphysema, scattered pulmonary nodules, groundglass attenuation.  Assessment and Plan: Dyspnea with exertion in a current every day smoker  Differential diagnosis include HX, Sarcoid, Respiratory Bronchiolitis -ILD 138 pack year smoking history Plan We will schedule you for a bronchoscopy with biopsies to better diagnose what process is causing the lung nodules in your lungs. Plan is for 05/05/2023. You will get a call with time, and additional information prior to the procedure.  You will also get a call to schedule a CT Chest needed prior to the procedure.  We have discussed risks and benefits of  the procedure. Ok to stop Markus Daft if you do not feel this is helping your breathing Continue Wellbutrin , and continue working on quitting smoking.  Call us if you need anything prior to procedure.  Please contact office for sooner follow up if symptoms do not improve or worsen  or seek emergency care    Follow Up Instructions: Bronchoscopy 05/05/2023 at [facility] Follow up 1 week after bronch with Maralyn Sago NP   I discussed the assessment and treatment plan with the patient. The patient was provided an opportunity to ask questions and all were answered. The patient agreed with the plan and demonstrated an understanding of the instructions.   The patient was advised to call back or seek an in-person evaluation if the symptoms worsen or if the condition fails to improve as anticipated.  II spent 40 minutes dedicated to the care of this patient on the date of this encounter to include pre-visit review of records, non-face-to-face time with the patient discussing conditions above, post visit ordering of testing, clinical documentation with the electronic health record, making appropriate referrals as documented, and communicating necessary information to the patient's healthcare team.   Bevelyn Ngo, NP  04/15/2023

## 2023-04-15 NOTE — Patient Instructions (Addendum)
It was good to speak with you today. We will schedule you for a bronchoscopy with biopsies to better diagnose what process is causing the lung nodules in your lungs. Plan is for 05/05/2023. You will get a call with time, and additional information prior to the procedure.  You will also get a call to schedule a CT Chest needed prior to the procedure.  We have discussed risks and benefits of the procedure. Ok to stop Markus Daft if you do not feel this is helping your breathing Continue Wellbutrin , and continue working on quitting smoking.  Call us if you need anything prior to procedure.  Please contact office for sooner follow up if symptoms do not improve or worsen or seek emergency care

## 2023-04-30 ENCOUNTER — Ambulatory Visit (HOSPITAL_COMMUNITY)
Admission: RE | Admit: 2023-04-30 | Discharge: 2023-04-30 | Disposition: A | Discharge status: Home / Self Care | Payer: Medicare HMO | Source: Ambulatory Visit | Attending: Acute Care | Admitting: Acute Care

## 2023-04-30 DIAGNOSIS — R911 Solitary pulmonary nodule: Secondary | ICD-10-CM | POA: Diagnosis present

## 2023-05-04 ENCOUNTER — Other Ambulatory Visit: Payer: Self-pay

## 2023-05-04 ENCOUNTER — Encounter (HOSPITAL_COMMUNITY): Payer: Self-pay | Admitting: Pulmonary Disease

## 2023-05-04 NOTE — Anesthesia Preprocedure Evaluation (Addendum)
Anesthesia Evaluation  Patient identified by MRN, date of birth, ID band Patient awake    Reviewed: Allergy & Precautions, H&P , NPO status , Patient's Chart, lab work & pertinent test results  Airway Mallampati: II  TM Distance: >3 FB Neck ROM: Full    Dental no notable dental hx.    Pulmonary asthma , sleep apnea , COPD, Current Smoker and Patient abstained from smoking.   Pulmonary exam normal breath sounds clear to auscultation       Cardiovascular negative cardio ROS Normal cardiovascular exam Rhythm:Regular Rate:Normal     Neuro/Psych  Headaches  Anxiety Depression     negative psych ROS   GI/Hepatic negative GI ROS, Neg liver ROS,,,  Endo/Other  negative endocrine ROS    Renal/GU negative Renal ROS  negative genitourinary   Musculoskeletal  (+) Arthritis , Rheumatoid disorders,  Fibromyalgia -  Abdominal  (+) + obese  Peds negative pediatric ROS (+)  Hematology negative hematology ROS (+)   Anesthesia Other Findings Lung Nodules  Reproductive/Obstetrics negative OB ROS                             Anesthesia Physical Anesthesia Plan  ASA: 3  Anesthesia Plan: General   Post-op Pain Management: Minimal or no pain anticipated   Induction: Intravenous  PONV Risk Score and Plan: 2 and Ondansetron, Midazolam and Treatment may vary due to age or medical condition  Airway Management Planned: Oral ETT  Additional Equipment:   Intra-op Plan:   Post-operative Plan: Extubation in OR  Informed Consent: I have reviewed the patients History and Physical, chart, labs and discussed the procedure including the risks, benefits and alternatives for the proposed anesthesia with the patient or authorized representative who has indicated his/her understanding and acceptance.     Dental advisory given  Plan Discussed with: CRNA  Anesthesia Plan Comments: (Stress test 02/19/2018 (Care  Everywhere): IMPRESSION:  1. No reversible ischemia or infarction.   2. Normal left ventricular wall motion.   3. Left ventricular ejection fraction 78%   4. Non invasive risk stratification*: Low   TTE 10/14/2016 (Care Everywhere): Conclusions  Summary  Normal LV structure and systolic function.  No significant Doppler findings.  No significant valvular abnormalities.   )        Anesthesia Quick Evaluation

## 2023-05-04 NOTE — Pre-Procedure Instructions (Addendum)
PCP - Karle Plumber, MD Cardiology - Rosita Kea, MD  Chest x-ray - 02/25/23 EKG - 02/26/23 Stress Test - 02/19/18 ECHO - 10/15/16  CPAP - Only wears when her asthma flares up  COVID TEST- Neg on 5/31--Was done at her [facility]. Will bring results in with her.   Anesthesia review: Y  Patient verbally denies any shortness of breath, fever, cough and chest pain during phone call   -------------  SDW INSTRUCTIONS given:  Your procedure is scheduled on Tues, June 4th.  Report to [facility] Main Entrance "A" at 0730 A.M., and check in at the Admitting office.  Call this number if you have problems the morning of surgery:  [phone]   Remember:  Do not eat or after midnight the night before your surgery    Take these medicines the morning of surgery with A SIP OF WATER  BREZTRI AEROSPHERE  omeprazole (PRILOSEC)  oxyCODONE-acetaminophen (PERCOCET)  TRULANCE  albuterol (PROVENTIL)-if needed albuterol (VENTOLIN HFA)-if needed cyclobenzaprine (FLEXERIL)-if needed gabapentin (NEURONTIN)-if needed SUMAtriptan (IMITREX)-if needed  As of today, STOP taking any Aspirin (unless otherwise instructed by your surgeon) Aleve, Naproxen, Ibuprofen, Motrin, Advil, Goody's, BC's, all herbal medications, fish oil, and all vitamins.                      Do not wear jewelry, make up, or nail polish            Do not wear lotions, powders, perfumes/colognes, or deodorant.            Do not shave 48 hours prior to surgery.  Men may shave face and neck.            Do not bring valuables to the hospital.            [facility] is not responsible for any belongings or valuables.  Do NOT Smoke (Tobacco/Vaping) 24 hours prior to your procedure If you use a CPAP at night, you may bring all equipment for your overnight stay.   Contacts, glasses, dentures or bridgework may not be worn into surgery.      For patients admitted to the hospital, discharge time will be determined by your treatment  team.   Patients discharged the day of surgery will not be allowed to drive home, and someone needs to stay with them for 24 hours.    Special instructions:   [place]- Preparing For Surgery  Before surgery, you can play an important role. Because skin is not sterile, your skin needs to be as free of germs as possible. You can reduce the number of germs on your skin by washing with CHG (chlorahexidine gluconate) Soap before surgery.  CHG is an antiseptic cleaner which kills germs and bonds with the skin to continue killing germs even after washing.    Oral Hygiene is also important to reduce your risk of infection.  Remember - BRUSH YOUR TEETH THE MORNING OF SURGERY WITH YOUR REGULAR TOOTHPASTE  Please do not use if you have an allergy to CHG or antibacterial soaps. If your skin becomes reddened/irritated stop using the CHG.  Do not shave (including legs and underarms) for at least 48 hours prior to first CHG shower. It is OK to shave your face.  Please follow these instructions carefully.   Shower the NIGHT BEFORE SURGERY and the MORNING OF SURGERY with DIAL Soap.   Pat yourself dry with a CLEAN TOWEL.  Wear CLEAN PAJAMAS to bed the night  before surgery  Place CLEAN SHEETS on your bed the night of your first shower and DO NOT SLEEP WITH PETS.   Day of Surgery: Please shower morning of surgery  Wear Clean/Comfortable clothing the morning of surgery Do not apply any deodorants/lotions.   Remember to brush your teeth WITH YOUR REGULAR TOOTHPASTE.   Questions were answered. Patient verbalized understanding of instructions.

## 2023-05-05 ENCOUNTER — Encounter (HOSPITAL_COMMUNITY): Payer: Self-pay | Admitting: Pulmonary Disease

## 2023-05-05 ENCOUNTER — Ambulatory Visit (HOSPITAL_COMMUNITY): Payer: Medicare HMO | Admitting: Physician Assistant

## 2023-05-05 ENCOUNTER — Ambulatory Visit (HOSPITAL_COMMUNITY): Payer: Medicare HMO

## 2023-05-05 ENCOUNTER — Ambulatory Visit (HOSPITAL_COMMUNITY)
Admission: RE | Admit: 2023-05-05 | Discharge: 2023-05-05 | Disposition: A | Discharge status: Home / Self Care | Payer: Medicare HMO | Attending: Pulmonary Disease | Admitting: Pulmonary Disease

## 2023-05-05 ENCOUNTER — Encounter (HOSPITAL_COMMUNITY)
Admission: RE | Disposition: A | Discharge status: Home / Self Care | Payer: Self-pay | Source: Home / Self Care | Attending: Pulmonary Disease

## 2023-05-05 ENCOUNTER — Ambulatory Visit (HOSPITAL_BASED_OUTPATIENT_CLINIC_OR_DEPARTMENT_OTHER): Payer: Medicare HMO | Admitting: Physician Assistant

## 2023-05-05 DIAGNOSIS — G43909 Migraine, unspecified, not intractable, without status migrainosus: Secondary | ICD-10-CM | POA: Diagnosis not present

## 2023-05-05 DIAGNOSIS — F418 Other specified anxiety disorders: Secondary | ICD-10-CM

## 2023-05-05 DIAGNOSIS — R918 Other nonspecific abnormal finding of lung field: Secondary | ICD-10-CM

## 2023-05-05 DIAGNOSIS — J449 Chronic obstructive pulmonary disease, unspecified: Secondary | ICD-10-CM | POA: Diagnosis not present

## 2023-05-05 DIAGNOSIS — F17210 Nicotine dependence, cigarettes, uncomplicated: Secondary | ICD-10-CM | POA: Insufficient documentation

## 2023-05-05 DIAGNOSIS — M797 Fibromyalgia: Secondary | ICD-10-CM | POA: Insufficient documentation

## 2023-05-05 DIAGNOSIS — M069 Rheumatoid arthritis, unspecified: Secondary | ICD-10-CM | POA: Diagnosis not present

## 2023-05-05 DIAGNOSIS — J982 Interstitial emphysema: Secondary | ICD-10-CM | POA: Diagnosis not present

## 2023-05-05 HISTORY — DX: COVID-19: U07.1

## 2023-05-05 HISTORY — PX: BRONCHIAL BIOPSY: SHX5109

## 2023-05-05 HISTORY — PX: BRONCHIAL NEEDLE ASPIRATION BIOPSY: SHX5106

## 2023-05-05 HISTORY — PX: BRONCHIAL WASHINGS: SHX5105

## 2023-05-05 HISTORY — PX: BRONCHIAL BRUSHINGS: SHX5108

## 2023-05-05 LAB — CULTURE, BAL-QUANTITATIVE W GRAM STAIN

## 2023-05-05 LAB — CBC
HCT: 41.2 % (ref 36.0–46.0)
Hemoglobin: 13.5 g/dL (ref 12.0–15.0)
MCH: 29.7 pg (ref 26.0–34.0)
MCHC: 32.8 g/dL (ref 30.0–36.0)
MCV: 90.7 fL (ref 80.0–100.0)
Platelets: 148 10*3/uL — ABNORMAL LOW (ref 150–400)
RBC: 4.54 MIL/uL (ref 3.87–5.11)
RDW: 15.7 % — ABNORMAL HIGH (ref 11.5–15.5)
WBC: 6.1 10*3/uL (ref 4.0–10.5)
nRBC: 0 % (ref 0.0–0.2)

## 2023-05-05 SURGERY — BRONCHOSCOPY, WITH BIOPSY USING ELECTROMAGNETIC NAVIGATION
Anesthesia: General

## 2023-05-05 MED ORDER — CHLORHEXIDINE GLUCONATE 0.12 % MT SOLN
OROMUCOSAL | Status: AC
  Administered 2023-05-05: 15 mL via OROMUCOSAL
  Filled 2023-05-05: qty 15

## 2023-05-05 MED ORDER — LIDOCAINE 2% (20 MG/ML) 5 ML SYRINGE
INTRAMUSCULAR | Status: DC | PRN
  Administered 2023-05-05: 100 mg via INTRAVENOUS

## 2023-05-05 MED ORDER — AMISULPRIDE (ANTIEMETIC) 5 MG/2ML IV SOLN: 10.0000 mg | Freq: Once | INTRAVENOUS | Status: DC | PRN

## 2023-05-05 MED ORDER — PROPOFOL 500 MG/50ML IV EMUL
INTRAVENOUS | Status: DC | PRN
  Administered 2023-05-05: 125 ug/kg/min via INTRAVENOUS

## 2023-05-05 MED ORDER — OXYCODONE HCL 5 MG/5ML PO SOLN
5.0000 mg | Freq: Once | ORAL | Status: AC | PRN
  Administered 2023-05-05: 5 mg via ORAL

## 2023-05-05 MED ORDER — LACTATED RINGERS IV SOLN: INTRAVENOUS | Status: DC

## 2023-05-05 MED ORDER — SUGAMMADEX SODIUM 200 MG/2ML IV SOLN
INTRAVENOUS | Status: DC | PRN
  Administered 2023-05-05: 200 mg via INTRAVENOUS

## 2023-05-05 MED ORDER — DEXAMETHASONE SODIUM PHOSPHATE 10 MG/ML IJ SOLN
INTRAMUSCULAR | Status: DC | PRN
  Administered 2023-05-05: 10 mg via INTRAVENOUS

## 2023-05-05 MED ORDER — SUCCINYLCHOLINE CHLORIDE 200 MG/10ML IV SOSY
PREFILLED_SYRINGE | INTRAVENOUS | Status: DC | PRN
  Administered 2023-05-05: 120 mg via INTRAVENOUS

## 2023-05-05 MED ORDER — FENTANYL CITRATE (PF) 250 MCG/5ML IJ SOLN
INTRAMUSCULAR | Status: DC | PRN
  Administered 2023-05-05: 100 ug via INTRAVENOUS

## 2023-05-05 MED ORDER — CHLORHEXIDINE GLUCONATE 0.12 % MT SOLN: 15.0000 mL | Freq: Once | OROMUCOSAL | Status: AC

## 2023-05-05 MED ORDER — PROPOFOL 10 MG/ML IV BOLUS
INTRAVENOUS | Status: DC | PRN
  Administered 2023-05-05: 130 mg via INTRAVENOUS

## 2023-05-05 MED ORDER — MEPERIDINE HCL 25 MG/ML IJ SOLN: 6.2500 mg | INTRAMUSCULAR | Status: DC | PRN

## 2023-05-05 MED ORDER — OXYCODONE HCL 5 MG/5ML PO SOLN
ORAL | Status: AC
  Filled 2023-05-05: qty 5

## 2023-05-05 MED ORDER — OXYCODONE HCL 5 MG PO TABS: 5.0000 mg | ORAL_TABLET | Freq: Once | ORAL | Status: AC | PRN

## 2023-05-05 MED ORDER — ROCURONIUM BROMIDE 10 MG/ML (PF) SYRINGE
PREFILLED_SYRINGE | INTRAVENOUS | Status: DC | PRN
  Administered 2023-05-05: 40 mg via INTRAVENOUS

## 2023-05-05 MED ORDER — HYDROMORPHONE HCL 1 MG/ML IJ SOLN: 0.2500 mg | INTRAMUSCULAR | Status: DC | PRN

## 2023-05-05 MED ORDER — PROMETHAZINE HCL 25 MG/ML IJ SOLN: 6.2500 mg | INTRAMUSCULAR | Status: DC | PRN

## 2023-05-05 MED ORDER — ONDANSETRON HCL 4 MG/2ML IJ SOLN
INTRAMUSCULAR | Status: DC | PRN
  Administered 2023-05-05: 4 mg via INTRAVENOUS

## 2023-05-05 NOTE — Op Note (Signed)
Video Bronchoscopy with Robotic Assisted Bronchoscopic Navigation   Date of Operation: 05/05/2023   Pre-op Diagnosis: nodules, cavities   Post-op Diagnosis: nodules, cavities   Surgeon: Josephine Igo, DO   Assistants: None   Anesthesia: General endotracheal anesthesia  Operation: Flexible video fiberoptic bronchoscopy with robotic assistance and biopsies.  Estimated Blood Loss: Minimal  Complications: None  Indications and History: Brittney Sanford is a 57 y.o. 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 The risks, benefits, complications, treatment options and expected outcomes were discussed with the patient.  The possibilities of pneumothorax, pneumonia, reaction to medication, pulmonary aspiration, perforation of a viscus, bleeding, failure to diagnose a condition and creating a complication requiring transfusion or operation were discussed with the patient who freely signed the consent.    Description of Procedure: The patient was seen in the Preoperative Area, was examined and was deemed appropriate to proceed.  The patient was taken to [facility] endoscopy room 3, identified as Brittney Sanford and the procedure verified as Flexible Video Fiberoptic Bronchoscopy.  A Time Out was held and the above information confirmed.   Prior to the date of the procedure a high-resolution CT scan of the chest was performed. Utilizing ION software program a virtual tracheobronchial tree was generated to allow the creation of distinct navigation pathways to the patient's parenchymal abnormalities. After being taken to the operating room general anesthesia was initiated and the patient  was orally intubated. The video fiberoptic bronchoscope was introduced via the endotracheal tube and a general inspection was performed which showed normal right and left lung anatomy, aspiration of the bilateral mainstems was completed to remove any remaining  secretions. Robotic catheter inserted into patient's endotracheal tube.   Target #1 LUL: The distinct navigation pathways prepared prior to this procedure were then utilized to navigate to patient's lesion identified on CT scan. The robotic catheter was secured into place and the vision probe was withdrawn.  Lesion location was approximated using fluoroscopy and 3D CBCT for ct guided needle placement for peripheral targeting. Under fluoroscopic guidance transbronchial needle brushings, transbronchial needle biopsies, and transbronchial forceps biopsies were performed to be sent for cytology and pathology. A bronchioalveolar lavage was performed in the RUL at the conclusion of the procedure and sent for cytology. At the end of the procedure a general airway inspection was performed and there was no evidence of active bleeding. The bronchoscope was removed.  The patient tolerated the procedure well. There was no significant blood loss and there were no obvious complications. A post-procedural chest x-ray is pending.  Samples Target #1: 1. Transbronchial needle brushings from LUL 2. Transbronchial Wang needle biopsies from LUL 3. Transbronchial forceps biopsies from LUL  Samples Target #2: 1. Bronchoalveolar lavage from RUL  Plans:  The patient will be discharged from the PACU to home when recovered from anesthesia and after chest x-ray is reviewed. We will review the cytology, pathology and microbiology results with the patient when they become available. Outpatient followup will be with Dr. Marchelle Gearing.  Josephine Igo, DO [facility] 05/05/2023 10:47 AM

## 2023-05-05 NOTE — Anesthesia Procedure Notes (Signed)
Procedure Name: Intubation Date/Time: 05/05/2023 10:43 AM  Performed by: Cheree Ditto, CRNAPre-anesthesia Checklist: Patient identified, Emergency Drugs available, Suction available and Patient being monitored Patient Re-evaluated:Patient Re-evaluated prior to induction Oxygen Delivery Method: Circle system utilized Preoxygenation: Pre-oxygenation with 100% oxygen Induction Type: IV induction Ventilation: Mask ventilation without difficulty Laryngoscope Size: Mac and 3 Grade View: Grade I Tube type: Oral Tube size: 8.5 mm Number of attempts: 1 Airway Equipment and Method: Stylet and Oral airway Placement Confirmation: ETT inserted through vocal cords under direct vision, positive ETCO2 and breath sounds checked- equal and bilateral Secured at: 22 cm Tube secured with: Tape Dental Injury: Teeth and Oropharynx as per pre-operative assessment

## 2023-05-05 NOTE — Transfer of Care (Signed)
Immediate Anesthesia Transfer of Care Note  Patient: Emmanuella Klouda  Procedure(s) Performed: ROBOTIC ASSISTED NAVIGATIONAL BRONCHOSCOPY BRONCHIAL BIOPSIES BRONCHIAL NEEDLE ASPIRATION BIOPSIES BRONCHIAL WASHINGS BRONCHIAL BRUSHINGS  Patient Location: PACU  Anesthesia Type:General  Level of Consciousness: awake, alert , and oriented  Airway & Oxygen Therapy: Patient Spontanous Breathing  Post-op Assessment: Report given to RN and Post -op Vital signs reviewed and stable  Post vital signs: Reviewed and stable  Last Vitals:  Vitals Value Taken Time  BP 109/76 05/05/23 1046  Temp 36.7 C 05/05/23 1043  Pulse 68 05/05/23 1051  Resp 21 05/05/23 1051  SpO2 94 % 05/05/23 1051  Vitals shown include unvalidated device data.  Last Pain:  Vitals:   05/05/23 0832  TempSrc:   PainSc: 3       Patients Stated Pain Goal: 0 (05/05/23 1610)  Complications: No notable events documented.

## 2023-05-05 NOTE — Interval H&P Note (Signed)
History and Physical Interval Note:  05/05/2023 8:41 AM  Brittney Sanford  has presented today for surgery, with the diagnosis of Upper lobe lung nodule.  The various methods of treatment have been discussed with the patient and family. After consideration of risks, benefits and other options for treatment, the patient has consented to  Procedure(s): ROBOTIC ASSISTED NAVIGATIONAL BRONCHOSCOPY (N/A) as a surgical intervention.  The patient's history has been reviewed, patient examined, no change in status, stable for surgery.  I have reviewed the patient's chart and labs.  Questions were answered to the patient's satisfaction.     [name]

## 2023-05-05 NOTE — Anesthesia Postprocedure Evaluation (Signed)
Anesthesia Post Note  Patient: Bernelda Bynog  Procedure(s) Performed: ROBOTIC ASSISTED NAVIGATIONAL BRONCHOSCOPY BRONCHIAL BIOPSIES BRONCHIAL NEEDLE ASPIRATION BIOPSIES BRONCHIAL WASHINGS BRONCHIAL BRUSHINGS     Patient location during evaluation: PACU Anesthesia Type: General Level of consciousness: awake and alert Pain management: pain level controlled Vital Signs Assessment: post-procedure vital signs reviewed and stable Respiratory status: spontaneous breathing, nonlabored ventilation and respiratory function stable Cardiovascular status: blood pressure returned to baseline and stable Postop Assessment: no apparent nausea or vomiting Anesthetic complications: no   No notable events documented.  Last Vitals:  Vitals:   05/05/23 1115 05/05/23 1130  BP: 116/80 116/74  Pulse: 60 60  Resp: 13 12  Temp:  36.7 C  SpO2: 97% 94%    Last Pain:  Vitals:   05/05/23 1130  TempSrc:   PainSc: 3                  Lowella Curb

## 2023-05-05 NOTE — Discharge Instructions (Signed)

## 2023-05-06 LAB — CYTOLOGY - NON PAP

## 2023-05-06 LAB — AEROBIC/ANAEROBIC CULTURE W GRAM STAIN (SURGICAL/DEEP WOUND)

## 2023-05-07 LAB — CULTURE, BAL-QUANTITATIVE W GRAM STAIN: Culture: 10000 — AB

## 2023-05-08 LAB — CYTOLOGY - NON PAP

## 2023-05-09 ENCOUNTER — Telehealth: Payer: Self-pay | Admitting: Student

## 2023-05-09 LAB — ACID FAST SMEAR (AFB, MYCOBACTERIA): Acid Fast Smear: NEGATIVE

## 2023-05-09 MED ORDER — PREDNISONE 10 MG PO TABS: ORAL_TABLET | ORAL | 0 refills | Status: DC

## 2023-05-09 NOTE — Telephone Encounter (Signed)
Called into access line for R pleuritic CP - she says feels cramping in nature. Since she woke up this morning. Mild dyspnea. No wheezing. She has some cough, productive of some clear sputum. She has had this kind of pain for the past month she says. 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 If develops rapidly worsening shortness of breath or marked worsening of chest pain despite above measures, encouraged to head to ED.

## 2023-05-10 ENCOUNTER — Encounter (HOSPITAL_COMMUNITY): Payer: Self-pay | Admitting: Pulmonary Disease

## 2023-05-10 LAB — AEROBIC/ANAEROBIC CULTURE W GRAM STAIN (SURGICAL/DEEP WOUND): Culture: NORMAL

## 2023-05-11 LAB — FUNGUS CULTURE WITH STAIN

## 2023-05-12 ENCOUNTER — Encounter: Payer: Self-pay | Admitting: Acute Care

## 2023-05-12 ENCOUNTER — Ambulatory Visit (INDEPENDENT_AMBULATORY_CARE_PROVIDER_SITE_OTHER): Payer: Medicare HMO | Admitting: Acute Care

## 2023-05-12 VITALS — BP 118/82 | HR 63 | Temp 97.9°F | Ht 64.0 in | Wt 196.0 lb

## 2023-05-12 DIAGNOSIS — J45909 Unspecified asthma, uncomplicated: Secondary | ICD-10-CM

## 2023-05-12 DIAGNOSIS — J849 Interstitial pulmonary disease, unspecified: Secondary | ICD-10-CM

## 2023-05-12 DIAGNOSIS — Z720 Tobacco use: Secondary | ICD-10-CM

## 2023-05-12 NOTE — Patient Instructions (Addendum)
IT is good to see you today. I am glad you are doing well after the biopsy. The biopsy results may not be enough for a clear diagnosis.  I have spoken with Dr. Tonia Brooms and Marchelle Gearing. We will present your case at the ILD conference in July. We will also refer you to be seen by Dr. Marchelle Gearing after the conference.  Please take the prednisone taper you were prescribed 05/09/2023. Take medication with food and in the morning.  Follow up with Dr. Marchelle Gearing in July after conference. Follow up in 2 weeks to see if prednisone has helped with chest discomfort. Please work on quitting smoking. Call 1-800-QUIT NOW for free nicotine replacement therapy Call us if you need to see [place] sooner.  Please contact office for sooner follow up if symptoms do not improve or worsen or seek emergency care

## 2023-05-12 NOTE — Progress Notes (Signed)
Discussed in OV with Sg today   Thanks,  BLI  Josephine Igo, DO [facility] 05/12/2023 11:10 AM

## 2023-05-12 NOTE — Progress Notes (Addendum)
History of Present Illness Brittney Sanford is a 57 y.o. female current smoker with history of asthma. She was referred to see Dr. Tonia Brooms  5/24 for pulmonary nodules by [facility] M. Kathrynn Speed,  MD .  Synopsis 57 year old female current every day smoker  with a history of asthma, COPD. No prior PFTs per patient. Longstanding history of tobacco use. No family history of sarcoidosis. She has a history of rheumatoid arthritis, but is not on any maintenance medications .  At her max was smoking 2 packs/day. She was referred for evaluation of her underlying emphysema COPD asthma. CT imaging of the chest completed during her recent hospital visit in March 2024  revealed upper lobe predominant cystic changes and interstitial changes with scattered pulmonary nodules.There is no history of family lung cancer. She was seen by Dr. Tonia Brooms, who 04/09/2023 who ordered PFT's, Started the patient on Breztri to see if this helps with her symptomatology. Counseled her heavily on smoking cessation , and started her on Welbutrin   Dr. Tonia Brooms also consulted with  Dr. Marchelle Gearing. He felt the differential diagnoses are HX, Sarcoid, Respiratory Bronchiolitis -ILD >> He advised Bronch first > if non diagnostic -> consider SLB OR direct SLB based on shared decision making and safety/risk profile.If bronch or TTBx but with caveat 30-50% yield rate. Can the lab do "CD-1a and CD207 in tissue and BAL fluid," which is highly specific for HX.  She underwent Flexible video fiberoptic bronchoscopy with robotic assistance and biopsies on 05/05/2023. Target was LUL, and in addition BAL from RUL was done . Biopsies showed no malignant cells, however specimen demonstrates pulmonary parenchyma with chronic inflammation and interstitial fibrosis.Dr. Tonia Brooms suspects suspect this is [facility] but she is still smoking . 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 There were also atypical spindle cells noted but there were so few cells on the block that features were  not sufficient to support a diagnosis of Langerhans cell histiocytosis [facility]).Recommendation that if clinical concern for [facility] persists, re-biopsy for more material is necessary to evaluate the possibility.    05/12/2023 Pt. Presents today for follow up bronch. Procedure was done 05/05/2023. She states she has done well after the bronch. No hemoptysis. She did have a sore throat, this has self resolved.She has had continued pain and chest tightness. She did call the office 05/09/2023. Dr. Thora Lance prescribed her a  prednisone taper, but she has not started it. She states she does have improvement in her chest pain with prednisone, so she has agreed to start this today.  I have sent biopsy results to Dr. Marchelle Gearing to evaluate, in addition to Dr. Tonia Brooms. It does confirm fibrosis, but not specific type. There were also atypical spindle cells noted but there were so few cells on the block that features were not sufficient to support a diagnosis of Langerhans cell histiocytosis.  Plan is to review at ILD conference, follow up with Dr. Marchelle Gearing and then determine if surgical lung biopsy should be next step as bronch was not definitive.  She will also start the prednisone taper Dr. Thora Lance prescribed 05/09/2023.    Test Results: A. LUNG, LUL, FINE NEEDLE ASPIRATION  BIOPSIES:  - No malignant cells identified  - See comment   The specimen demonstrates pulmonary parenchyma with chronic inflammation and interstitial fibrosis. There are scattered atypical spindle cells in the smear. However, the cells are so scant in the cell block. Immunohistochemical stain for CD1a labels rare cells wile those cells are not present  on S100 stain. The features are so limited and not sufficient to support a diagnosis of Langerhans cell histiocytosis [facility]). If clinical concern for [facility] persists, re-biopsy for more material is necessary to evaluate the possibility.   C. LUNG, LUL, BRUSHING:  - No malignant cells identified    Cultures 05/05/2023 BAL Negative for Fungus [employer] Negative No anaerobes isolated BAL Cx >> 10,000 colonies of normal respiratory flora>> no staph aureus or Pseudomonas seen  Super D CT Chest 05/05/23 Scattered areas of regional interstitial lung disease, upper lung predominant with more prominent nodularity in the mid lungs and relative sparing of the extreme bases. Imaging features are most suggestive of an infectious/inflammatory etiology. 2. Innumerable bilateral pulmonary nodules measuring up to 15 mm. 3. Aortic Atherosclerosis (ICD10-I70.0) and Emphysema (ICD10-J43.9).     Latest Ref Rng & Units 05/05/2023    8:39 AM 02/25/2023    1:31 PM 08/20/2022   11:41 AM  CBC  WBC 4.0 - 10.5 K/uL 6.1  7.9  10.0   Hemoglobin 12.0 - 15.0 g/dL 84.6  96.2  95.2   Hematocrit 36.0 - 46.0 % 41.2  39.1  42.9   Platelets 150 - 400 K/uL 148  192  187        Latest Ref Rng & Units 02/25/2023    1:31 PM 08/20/2022   11:41 AM 06/16/2021    7:05 PM  BMP  Glucose 70 - 99 mg/dL 84  84  89   BUN 6 - 20 mg/dL 8  10  12    Creatinine 0.44 - 1.00 mg/dL 8.41  3.24  4.01   Sodium 135 - 145 mmol/L 140  139  141   Potassium 3.5 - 5.1 mmol/L 3.9  5.6  3.9   Chloride 98 - 111 mmol/L 105  110  107   CO2 22 - 32 mmol/L 29  22  27    Calcium 8.9 - 10.3 mg/dL 9.5  9.7  02.7     BNP No results found for: "BNP"  ProBNP No results found for: "PROBNP"  PFT No results found for: "FEV1PRE", "FEV1POST", "FVCPRE", "FVCPOST", "TLC", "DLCOUNC", "PREFEV1FVCRT", "PSTFEV1FVCRT"  CT Super D Chest Wo Contrast  Result Date: 05/05/2023 CLINICAL DATA:  Pulmonary nodule on previous imaging. Pre-procedure planning. EXAM: CT CHEST WITHOUT CONTRAST TECHNIQUE: Multidetector CT imaging of the chest was performed using thin slice collimation for electromagnetic bronchoscopy planning purposes, without intravenous contrast. RADIATION DOSE REDUCTION: This exam was performed according to the departmental dose-optimization program which  includes automated exposure control, adjustment of the mA and/or kV according to patient size and/or use of iterative reconstruction technique. COMPARISON:  Chest CTA 02/25/2023. FINDINGS: Cardiovascular: The heart size is normal. No substantial pericardial effusion. Mild atherosclerotic calcification is noted in the wall of the thoracic aorta. Mediastinum/Nodes: No mediastinal lymphadenopathy. No evidence for gross hilar lymphadenopathy although assessment is limited by the lack of intravenous contrast on the current study. The esophagus has normal imaging features. There is no axillary lymphadenopathy. Lungs/Pleura: Centrilobular and paraseptal emphysema evident. Scattered areas of regional interstitial lung disease noted, upper lung predominant with more prominent nodularity in the mid lungs and relative sparing of the extreme bases. Consolidative nodular opacity in the left upper lobe measures 15 mm on image 41/6. 8 mm index left upper lobe nodular opacity noted on 47/6. Index 7 mm right lower lobe nodule identified on 61/6 and there is a peripheral 7 mm posterior right lower lobe nodule on 67/6. No evidence for pleural effusion. Upper Abdomen: Surgical  changes are noted in the stomach. 2.8 cm lesion in the interpolar left kidney approaches water density and is not substantially changed since abdomen CT 02/05/2021. Imaging features compatible with a simple cyst. No followup imaging is recommended. Musculoskeletal: No worrisome lytic or sclerotic osseous abnormality. IMPRESSION: 1. Scattered areas of regional interstitial lung disease, upper lung predominant with more prominent nodularity in the mid lungs and relative sparing of the extreme bases. Imaging features are most suggestive of an infectious/inflammatory etiology. 2. Innumerable bilateral pulmonary nodules measuring up to 15 mm. 3. Aortic Atherosclerosis (ICD10-I70.0) and Emphysema (ICD10-J43.9). Electronically Signed   By: Kennith Center M.D.   On:  05/05/2023 16:02   DG Chest Port 1 View  Result Date: 05/05/2023 CLINICAL DATA:  Status post bronchoscopy. EXAM: PORTABLE CHEST 1 VIEW COMPARISON:  Chest radiograph 02/25/2023; CTA chest 02/25/2023 FINDINGS: Stable cardiac and mediastinal contours. When compared to recent chest radiograph there is increased patchy consolidation within the right upper hemithorax. Similar background of consolidation within the left upper hemithorax. No pleural effusion or pneumothorax. Thoracic spine degenerative changes. IMPRESSION: 1. Increased patchy consolidation within the right upper hemithorax. Similar background of consolidation within the left upper hemithorax. 2. No pneumothorax. Electronically Signed   By: Annia Belt M.D.   On: 05/05/2023 12:17   DG C-ARM BRONCHOSCOPY  Result Date: 05/05/2023 C-ARM BRONCHOSCOPY: Fluoroscopy was utilized by the requesting physician.  No radiographic interpretation.     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 She started smoking about 46 years ago. She has a 138.00 pack-year smoking history. She  has never used smokeless tobacco. She reports that she does not drink alcohol and does not use drugs.  Tobacco Cessation: Current every day smoker 138 pack year smoking history  Past surgical hx, Family hx, Social hx all reviewed.  Current Outpatient Medications on File Prior to Visit  Medication Sig   albuterol (PROVENTIL) (2.5 MG/3ML) 0.083% nebulizer solution Take 3 mLs (2.5 mg total) by nebulization every 6 (six) hours as needed for wheezing or shortness of breath.   albuterol (VENTOLIN HFA) 108 (90 Base) MCG/ACT inhaler Inhale 2 puffs into the lungs every 6 (six) hours as needed for wheezing or shortness of breath.   Budeson-Glycopyrrol-Formoterol (BREZTRI AEROSPHERE) 160-9-4.8 MCG/ACT AERO Inhale 2  puffs into the lungs in the morning and at bedtime.   Calcium Carb-Cholecalciferol (CALCIUM 500 + D3 PO) Place 1 patch onto the skin daily. Patch Aid Vitamin D3 + Calcium Patch   cyclobenzaprine (FLEXERIL) 10 MG tablet Take 10 mg by mouth 3 (three) times daily as needed for muscle spasms.   ergocalciferol (VITAMIN D2) 1.25 MG (50000 UT) capsule Take 50,000 Units by mouth every Sunday.   estradiol (VIVELLE-DOT) 0.0375 MG/24HR Place 1 patch onto the skin 2 (two) times a week. Sundays & Thursdays.   folic acid (FOLVITE) 1 MG tablet Take 1 mg by mouth in the morning.   gabapentin (NEURONTIN) 300 MG capsule Take 300 mg by mouth 3 (three) times daily as needed (nerve pain).   meloxicam (MOBIC) 7.5 MG tablet Take 7.5 mg by mouth 2 (two) times daily as needed (pain.).   MULTIPLE VITAMINS-IRON PO Place 1 patch onto the skin daily. Patch Aid Iron Plus Multivitamin Patch   omeprazole (PRILOSEC) 40 MG capsule Take 40 mg by mouth daily before breakfast.   oxyCODONE-acetaminophen (PERCOCET) 10-325 MG tablet Take 1 tablet by mouth in the morning, at noon, and at bedtime.   rosuvastatin (CRESTOR) 10 MG tablet Take 10 mg by mouth at bedtime.   SUMAtriptan (IMITREX) 100 MG tablet Take 100 mg by mouth every 2  (two) hours as needed for migraine.   TRULANCE 3 MG TABS Take 3 mg by mouth in the morning.   buPROPion (WELLBUTRIN SR) 150 MG 12 hr tablet Take 1 tablet (150 mg total) by mouth 2 (two) times daily. One tab per day for 3 days then increase to twice daily (Patient not taking: Reported on 05/01/2023)   predniSONE (DELTASONE) 10 MG tablet Take 4 tabs by mouth for 3 days, then 3 for 3 days, 2 for 3 days, 1 for 3 days and stop (Patient not taking: Reported on 05/12/2023)   No current facility-administered medications on file prior to visit.     Allergies  Allergen Reactions   Lithium Shortness Of Breath, Nausea And Vomiting, Nausea Only and Rash    Difficulty breathing   Aspirin Rash    Review Of Systems:  Constitutional:   No  weight loss, night sweats,  Fevers, chills, +fatigue, or  lassitude.  HEENT:   No headaches,  Difficulty swallowing,  Tooth/dental problems, or  Sore throat,                No sneezing, itching, ear ache, nasal congestion, post nasal drip,   CV:  No chest pain, but chest tightness, No Orthopnea, PND, swelling in lower extremities, anasarca, dizziness, palpitations, syncope.   GI  No heartburn, indigestion, abdominal pain, nausea, vomiting, diarrhea, change in bowel habits, loss of appetite, bloody stools.   Resp: No shortness of breath with exertion or at rest.  No excess mucus, no productive cough,  No non-productive cough,  No coughing up of blood.  No change in color of mucus.  No wheezing.  No chest wall deformity  Skin: no rash or lesions.  GU: no dysuria, change in color of urine, no urgency or frequency.  No flank pain, no hematuria   MS:  No joint pain or swelling.  No decreased range of motion.  No back pain.  Psych:  No change in mood or affect. No depression or anxiety.  No memory loss.   Vital Signs BP 118/82 (BP Location: Left Arm, Cuff Size: Large)   Pulse 63   Temp 97.9 F (36.6 C) (  Temporal)   Ht 5\' 4"  (1.626 m)   Wt 196 lb (88.9 kg)   SpO2  99%   BMI 33.64 kg/m    Physical Exam:  General- No distress,  A&Ox3, pleasant ENT: No sinus tenderness, TM clear, pale nasal mucosa, no oral exudate,no post nasal drip, no LAN Cardiac: S1, S2, regular rate and rhythm, no murmur Chest: No wheeze/ rales/ dullness; no accessory muscle use, no nasal flaring, no sternal retractions, chest tightness Abd.: Soft Non-tender, ND, BS +, Body mass index is 33.64 kg/m.  Ext: No clubbing cyanosis, edema Neuro:  normal strength, MAE x 4, A&O x 3 Skin: No rashes, warm and dry, no lesions  Psych: normal mood and behavior   Assessment/Plan Dyspnea with exertion in a current every day smoker  Chest tightness Differential diagnosis include HX, Sarcoid, Respiratory Bronchiolitis -ILD 138 pack year smoking history Bronch biopsies were + for chronic inflammation and interstitial fibrosis( But not type).  Also had atypical spindle cells but  features were not sufficient to support a diagnosis of Langerhans cell histiocytosis. Plan I am glad you are doing well after the biopsy. The biopsy results may not be enough for a clear diagnosis.  I have spoken with Dr. Tonia Brooms and Marchelle Gearing. They will present your case at the ILD conference in July. We will also refer you to be seen by Dr. Marchelle Gearing after the conference. He will let you know if next step in a surgical lung biopsy  Please take the prednisone taper you were prescribed 05/09/2023. Take medication with food and in the morning.  Follow up with Dr. Marchelle Gearing in July after conference. Follow up in 2 weeks to see if prednisone has helped with chest discomfort. Please work on quitting smoking completely. Call 1-800-QUIT NOW for free nicotine replacement therapy Call us if you need to see [place] sooner.  Please contact office for sooner follow up if symptoms do not improve or worsen or seek emergency care   I spent 45 minutes dedicated to the care of this patient on the date of this encounter to include  pre-visit review of records, face-to-face time with the patient discussing conditions above, post visit ordering of testing, clinical documentation with the electronic health record, making appropriate referrals as documented, and communicating necessary information to the patient's healthcare team.   Bevelyn Ngo, NP 05/12/2023  9:02 AM

## 2023-06-05 ENCOUNTER — Ambulatory Visit: Payer: Medicare HMO | Admitting: Acute Care

## 2023-06-05 LAB — FUNGUS CULTURE WITH STAIN

## 2023-06-05 LAB — FUNGAL ORGANISM REFLEX

## 2023-06-05 LAB — FUNGUS CULTURE RESULT

## 2023-06-09 ENCOUNTER — Ambulatory Visit: Payer: Self-pay | Admitting: Internal Medicine

## 2023-06-09 NOTE — Progress Notes (Cosign Needed)
Interstitial Lung Disease Multidisciplinary Conference   Brittney Sanford    MRN [id number]    DOB [birth year]  Primary Care Physician:Arvind, Idelia Salm, MD  Referring Physician: Kandice Robinsons, NP  Time of Conference: 7.00am- 8.00am Date of conference: 06/09/2023 Location of Conference: -  Virtual  Participating Pulmonary: Dr. Kalman Shan Pathology: - Radiology: Dr Allegra Lai  Others: -  Brief History: 57 year old female current every day smoker with a history of asthma, COPD. No prior PFTs per patient. Longstanding history of tobacco use. No family history of sarcoidosis. She has a history of rheumatoid arthritis, but is not on any maintenance medications. At her max was smoking 2 packs/day. She was referred for evaluation of her underlying emphysema COPD asthma. CT imaging of the chest completed during her recent hospital visit in March 2024  revealed upper lobe predominant cystic changes and interstitial changes with scattered pulmonary nodules. There is no history of family lung cancer. She was seen by Dr. Tonia Brooms, who 04/09/2023 who ordered PFT's, Started the patient on Breztri to see if this helps with her symptomatology. Counseled her heavily on smoking cessation , and started her on Wellbutrin. Dr. Tonia Brooms also consulted with  Dr. Marchelle Gearing. He felt the differential diagnoses are HX, Sarcoid, Respiratory Bronchiolitis -ILD >> He advised Bronch first > if non diagnostic -> consider SLB OR direct SLB based on shared decision making and safety/risk profile. If bronch or TTBx but with caveat 30-50% yield rate. Can the lab do "CD-1a and CD207 in tissue and BAL fluid," which is highly specific for HX. She underwent Flexible video fiberoptic bronchoscopy with robotic assistance and biopsies on 05/05/2023. Target was LUL, and in addition BAL from RUL was done . Biopsies showed no malignant cells, however specimen demonstrates pulmonary parenchyma with chronic inflammation and interstitial  fibrosis. Dr. Tonia Brooms suspects suspect this is [facility] but she is still smoking . 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 There were also atypical spindle cells noted but there were so few cells on the block that features were not sufficient to support a diagnosis of Langerhans cell histiocytosis. [facility]Recommendation per  pathology is  that if clinical concern for [facility] persists, re-biopsy for more material is necessary to evaluate the possibility. -Plan is to review at ILD conference and then determine if surgical lung biopsy should be next step as bronch was not definitive.  She is on a prednisone taper at present to see if that brings her any relief.   PFT     No data to display            MDD discussion of CT scan    - Date or time period of scan: CT Super D Chest: 04/30/2023    - Discussion synopsis:  In 2019 lungs had mild emphyasema only without nodularity. In 2024 there is sparring of lung base but has UL predminiant disase with nodularity and small cavitary nodules and ground glass opacities.   - What is the final conclusion per 2018 ATS/Fleischner Criteria - Overall per Dr Almira Coaster - classic HX and some DIP changes likely  - Concordance with official report: discordant - was not read by thoracic radioligy  Pathology discussion of biopsy: n/a    MDD Impression/Recs: rec: no need for SLB. Classic HX. RX as such including quitting smoking   Time Spent in preparation and discussion:  > 30 min    SIGNATURE   Dr. Kalman Shan, M.D., F.C.C.P,  Pulmonary and Critical Care Medicine Staff Physician,  [facility] Center Director - Interstitial Lung Disease  Program  Pulmonary Fibrosis [facility] [place], [place], [address]  Pager: [phone], If no answer or between  15:00h - 7:00h: call [phone] Telephone: [phone]  9:40  PM 06/09/2023 ...................................................................................................................Marland Kitchen References: Diagnosis of Hypersensitivity Pneumonitis in Adults. An Official ATS/JRS/ALAT Clinical Practice Guideline. Ragu G et al, Am J Respir Crit Care Med. 2020 Aug 1;202(3):e36-e69.       Diagnosis of Idiopathic Pulmonary Fibrosis. An Official ATS/ERS/JRS/ALAT Clinical Practice Guideline. Raghu G et al, Am J Respir Crit Care Med. 2018 Sep 1;198(5):e44-e68.   IPF Suspected   Histopath ology Pattern      UIP  Probable UIP  Indeterminate for  UIP  Alternative  diagnosis    UIP  IPF  IPF  IPF  Non-IPF dx   HRCT   Probabe UIP  IPF  IPF  IPF (Likely)**  Non-IPF dx  Pattern  Indeterminate for UIP  IPF  IPF (Likely)**  Indeterminate  for IPF**  Non-IPF dx    Alternative diagnosis  IPF (Likely)**/ non-IPF dx  Non-IPF dx  Non-IPF dx  Non-IPF dx     Idiopathic pulmonary fibrosis diagnosis based upon HRCT and Biopsy paterns.  ** IPF is the likely diagnosis when any of following features are present:  Moderate-to-severe traction bronchiectasis/bronchiolectasis (defined as mild traction bronchiectasis/bronchiolectasis in four or more lobes including the lingual as a lobe, or moderate to severe traction bronchiectasis in two or more lobes) in a man over age 22 years or in a woman over age 42 years Extensive (>30%) reticulation on HRCT and an age >70 years  Increased neutrophils and/or absence of lymphocytosis in BAL fluid  Multidisciplinary discussion reaches a confident diagnosis of IPF.   **Indeterminate for IPF  Without an adequate biopsy is unlikely to be IPF  With an adequate biopsy may be reclassified to a more specific diagnosis after multidisciplinary discussion and/or additional consultation.   dx = diagnosis; HRCT = high-resolution computed tomography; IPF = idiopathic pulmonary fibrosis; UIP = usual interstitial  pneumonia.

## 2023-06-16 ENCOUNTER — Encounter: Payer: Self-pay | Admitting: Internal Medicine

## 2023-06-16 ENCOUNTER — Ambulatory Visit: Payer: Medicare HMO | Admitting: Internal Medicine

## 2023-06-16 VITALS — BP 128/84 | HR 51 | Temp 98.6°F | Ht 64.0 in | Wt 198.4 lb

## 2023-06-16 DIAGNOSIS — J8482 Adult pulmonary Langerhans cell histiocytosis: Secondary | ICD-10-CM | POA: Diagnosis not present

## 2023-06-16 DIAGNOSIS — Z720 Tobacco use: Secondary | ICD-10-CM | POA: Diagnosis not present

## 2023-06-16 DIAGNOSIS — R0609 Other forms of dyspnea: Secondary | ICD-10-CM

## 2023-06-16 DIAGNOSIS — C966 Unifocal Langerhans-cell histiocytosis: Secondary | ICD-10-CM

## 2023-06-16 DIAGNOSIS — R0789 Other chest pain: Secondary | ICD-10-CM

## 2023-06-16 DIAGNOSIS — J84117 Desquamative interstitial pneumonia: Secondary | ICD-10-CM | POA: Diagnosis not present

## 2023-06-16 DIAGNOSIS — Z716 Tobacco abuse counseling: Secondary | ICD-10-CM

## 2023-06-16 DIAGNOSIS — R053 Chronic cough: Secondary | ICD-10-CM

## 2023-06-16 MED ORDER — VARENICLINE TARTRATE 0.5 MG PO TABS: ORAL_TABLET | ORAL | 0 refills | Status: DC

## 2023-06-16 NOTE — Progress Notes (Addendum)
OV saral groce 98?30/84  57 year old female current every day smoker  with a history of asthma, COPD. No prior PFTs per patient. Longstanding history of tobacco use. No family history of sarcoidosis. She has a history of rheumatoid arthritis, but is not on any maintenance medications .  At her max was smoking 2 packs/day. She was referred for evaluation of her underlying emphysema COPD asthma. CT imaging of the chest completed during her recent hospital visit in March 2024  revealed upper lobe predominant cystic changes and interstitial changes with scattered pulmonary nodules.There is no history of family lung cancer. She was seen by Dr. Tonia Brooms, who 04/09/2023 who ordered PFT's, Started the patient on Breztri to see if this helps with her symptomatology. Counseled her heavily on smoking cessation , and started her on Welbutrin   Dr. Tonia Brooms also consulted with  Dr. Marchelle Gearing. He felt the differential diagnoses are HX, Sarcoid, Respiratory Bronchiolitis -ILD >> He advised Bronch first > if non diagnostic -> consider SLB OR direct SLB based on shared decision making and safety/risk profile.If bronch or TTBx but with caveat 30-50% yield rate. Can the lab do "CD-1a and CD207 in tissue and BAL fluid," which is highly specific for HX.  She underwent Flexible video fiberoptic bronchoscopy with robotic assistance and biopsies on 05/05/2023. Target was LUL, and in addition BAL from RUL was done . Biopsies showed no malignant cells, however specimen demonstrates pulmonary parenchyma with chronic inflammation and interstitial fibrosis.Dr. Tonia Brooms suspects suspect this is [facility] but she is still smoking . 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 There were also atypical spindle cells noted but there were so few cells on the block that features were not sufficient to support a diagnosis of Langerhans cell histiocytosis [facility]).Recommendation that if clinical concern for [facility] persists, re-biopsy for more material is necessary to  evaluate the possibility.     Pt. Presents today for follow up bronch. Procedure was done 05/05/2023. She states she has done well after the bronch. No hemoptysis. She did have a sore throat, this has self resolved.She has had continued pain and chest tightness. She did call the office 05/09/2023. Dr. Thora Lance prescribed her a  prednisone taper, but she has not started it. She states she does have improvement in her chest pain with prednisone, so she has agreed to start this today.  I have sent biopsy results to Dr. Marchelle Gearing to evaluate, in addition to Dr. Tonia Brooms. It does confirm fibrosis, but not specific type. There were also atypical spindle cells noted but there were so few cells on the block that features were not sufficient to support a diagnosis of Langerhans cell histiocytosis.  Plan is to review at ILD conference, follow up with Dr. Marchelle Gearing and then determine if surgical lung biopsy should be next step as bronch was not definitive.  She will also start the prednisone taper Dr. Thora Lance prescribed 05/09/2023.      OV 06/16/2023 -referred by Dr. Elige Radon Icard to Dr. Marchelle Gearing in the ILD center.  Subjective:  Patient ID: Brittney Sanford, female , DOB: 04/06/1966 , age 33 y.o. , MRN: [id number] , ADDRESS: [address] [place] [address] PCP Karle Plumber, MD Patient Care Team: Karle Plumber, MD as PCP - General (Internal Medicine) August Luz, MD (Cardiology)  This Provider for this visit: Treatment Team:  Attending Provider: Kalman Shan, MD    06/16/2023 -   Chief Complaint  Patient presents with   Follow-up  No concerns, a little cough, chest pain,      HPI Sanford Brittney 57 y.o. -presents with her sister and her grandchildren.  The grandchildren are below 46 years of age and are visiting from [place].  None of the family members are independent historians today.  History is gained from talking to the patient and also review of the prior medical  records.  She has been a heavy smoker and for the last 1 years experienced shortness of breath and cough and for the last 2 to 3 months is also having atypical chest pain from the sternal area radiating to the right infra axillary area that comes and goes it is mild but occasionally severe.  It does wake her up.  There is no sweating.  It is also worse with deep inspiration.  She definitely feels she wants treatment for this.  She has been on Breztri inhaler but without much relief.  Did discuss her CT scan of the chest last week at the interstitial lung disease conference.  My early suspicion when Dr. Tonia Brooms referred her to me was histocytosis X.  To this end he did bronchoscopy.  It was nondiagnostic but some single cells were seen.  Cultures negative.'s at the case conference Dr. 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 Personal visualization of a previous scan from 2019 her findings of upper lobe cyst were very minimal but currently is a lot more extensive.  In addition there is pulmonary infiltrates in the upper and lower lobes.  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  Explained to her that quitting smoking was a #1 and best treatment.  Greater than 70% improving/cure rate if she quit smoking but without quitting smoking she can progress.  Did indicate to her that surgical lung biopsy to make a definitive diagnosis was an option but the current pretest probably is very high.  We took a shared decision making to hold off on surgical lung biopsy and focus on quitting smoking.  Regarding associated DIP/ILD: After the visit I did order autoimmune serology profile but when the CMA went to discuss this with her she did not want to get this done.  We will call her maybe address this at the next visit.  She tried Wellbutrin from the office in the past but she  said it gave her "chest pain".  I do not think the side effect was related to the Wellbutrin probably was coincidental.  However did discuss Chantix.  She does not have bad dreams.  Did indicate the side effects of Chantix.  She currently smoking 2 cigarettes to 3 cigarettes a day.  Did indicate to her she could cut down by 1 cigarette every few days and then in a month stop smoking altogether.  Meanwhile she can start Chantix straightaway and did explain the benefits of Chantix by preventing withdrawal symptoms.  Regarding her symptoms she is most worried about the atypical chest pain.  I want to make sure there is no cardiac issue so we will get an echocardiogram..  In addition I want to make sure there is no bone involvement.  I have written to Dr. Dorothey Baseman via email to see whether we should get an MRI chest or she could just look at the CT chest sternum again or maybe get a bone scan.     SYMPTOM SCALE - ILD 06/16/2023  Current weight  O2 use ra  Shortness of Breath 0 -> 5 scale with 5 being worst (score 6 If unable to do)  At rest 0  Simple tasks - showers, clothes change, eating, shaving 4  Household (dishes, doing bed, laundry) 4  Shopping 4  Walking level at own pace 4  Walking up Stairs 20  Total (30-36) Dyspnea Score 3  How bad is your cough? 4  How bad is your fatigue 0  How bad is nausea 0  How bad is vomiting?  0  How bad is diarrhea? 0  How bad is anxiety? 0  How bad is depression 0  Any chronic pain - if so where and how bad 0      CAse cnferece July 2024   - Discussion synopsis:  In 2019 lungs had mild emphyasema only without nodularity. In 2024 there is sparring of lung base but has UL predminiant disase with nodularity and small cavitary nodules and ground glass opacities.   - What is the final conclusion per 2018 ATS/Fleischner Criteria - Overall per Dr Almira Coaster - classic HX and some DIP changes likely  - Concordance with official report: discordant - was  not read by thoracic radioligy  CT Chest data from date:04/30/23 - was super D CT  - personally visualized and independently interpreted : yes during MDD conference last week with Dr Dorothey Baseman - my findings are: Histiocytosis X and some DIP. T his is in variance with official report   LAB RESULTS last 96 hours No results found.  LAB RESULTS last 90 days Recent Results (from the past 2160 hour(s))  CBC per protocol     Status: Abnormal   Collection Time: 05/05/23  8:39 AM  Result Value Ref Range   WBC 6.1 4.0 - 10.5 K/uL   RBC 4.54 3.87 - 5.11 MIL/uL   Hemoglobin 13.5 12.0 - 15.0 g/dL   HCT 16.1 09.6 - 04.5 %   MCV 90.7 80.0 - 100.0 fL   MCH 29.7 26.0 - 34.0 pg   MCHC 32.8 30.0 - 36.0 g/dL   RDW 40.9 (H) 81.1 - 91.4 %   Platelets 148 (L) 150 - 400 K/uL   nRBC 0.0 0.0 - 0.2 %    Comment: Performed at [facility], [address], [place], [place] [address]  Cytology - Non PAP;     Status: None   Collection Time: 05/05/23 10:20 AM  Result Value Ref Range   CYTOLOGY - NON GYN      CYTOLOGY - NON PAP CASE: [id number] PATIENT: Nakeshia Sobczyk Non-Gynecological Cytology Report     Clinical History: Upper Lobe Nodule, ? Langerhans cell histiocytosis    FINAL MICROSCOPIC DIAGNOSIS:  A. LUNG, LUL, FINE NEEDLE ASPIRATION  BIOPSIES: - No malignant cells identified - See comment  C. LUNG, LUL, BRUSHING: - No malignant cells identified   SPECIMEN ADEQUACY: A. Satisfactory for Evaluation C.  Satisfactory for Evaluation  COMMENT:  The specimen demonstrates pulmonary parenchyma with chronic inflammation and interstitial fibrosis. There are scattered atypical spindle cells in the smear. However, the cells are so scant in the cell block. Immunohistochemical stain for CD1a labels rare cells wile those cells are not present on S100 stain. The features are so limited and not sufficient to support a diagnosis of Langerhans cell histiocytosis [facility]). If clinical  concern for [facility] persists, re-biopsy for more material is necessary to evaluate the p ossibility.  Controls worked appropriately.  The case was peer reviewed by Dr. Venetia Night who  agrees with the interpretation.  GROSS: Received is/are (A)(1) 2 Slides (1 Quick Stain). Also received are 7cc's of red color fluid in saline solution from needle rinse. Biopsy/tissue pieces included in cell block. (C)(1) 2 Slides (1 Quick Stain). (JB:TC;tc) Smears: (A) 2 (C) 2 Concentration Method (ThinPrep): (A) 1 (C) 0 Cell Block: (A) 1 Conventional (C) 0 Additional Studies: Bronchial Lavage (B) [id number]  Final Diagnosis performed by Lance Coon, MD.   Electronically signed 05/08/2023 Technical and / or Professional components performed at [facility]. [facility], [address] [place], [place] [address].  Immunohistochemistry Technical component (if applicable) was performed at [facility]. [address], [place], [place] [address].   IMMUNOHISTOCHEMISTRY DISCLAIMER (if applicable): Some of these immunohistochemical stains may hav e been developed and the performance characteristics determine by [facility]. Some may not have been cleared or approved by the U.S. Food and Drug Administration. The FDA has determined that such clearance or approval is not necessary. This test is used for clinical purposes. It should not be regarded as investigational or for research. This laboratory is certified under the Clinical Laboratory Improvement Amendments of 1988 (CLIA-88) as qualified to perform high complexity clinical laboratory testing.  The controls stained appropriately.   IHC stains are performed on formalin fixed, paraffin embedded tissue using a 3,3"diaminobenzidine (DAB) chromogen and Leica Bond Autostainer System. The staining intensity of the nucleus is score manually and is reported as the percentage of tumor cell nuclei demonstrating specific  nuclear staining. The specimens are fixed in 10% Neutral Formalin for at least 6 hours and up to 72hrs. These tests are validated on decalcifi ed tissue. Results should be interpreted with caution given the possibility of false negative results on decalcified specimens. Antibody Clones are as follows ER-clone 36F, PR-clone 16, Ki67- clone MM1. Some of these immunohistochemical stains may have been developed and the performance characteristics determined by [facility].   Cytology - Non PAP;     Status: None   Collection Time: 05/05/23 10:31 AM  Result Value Ref Range   CYTOLOGY - NON GYN      CYTOLOGY - NON PAP CASE: [id number] PATIENT: Jaselynn Mccollom Non-Gynecological Cytology Report     Clinical History: Upper Lobe nodules, ? Langerhans cell histiocytosis Specimen Submitted:  B. LUNG, RUL, LAVAGE:   FINAL MICROSCOPIC DIAGNOSIS: - No malignant cells identified  SPECIMEN ADEQUACY: Satisfactory for evaluation  GROSS: Received is/are 20cc's of clear fluid. (TC:tc) Smears:2 Concentration Method (ThinPrep): 0 Cell Block: 0 Additional Studies: NAV (A  C) [id number]     Final Diagnosis performed by Lance Coon, MD.   Electronically signed 05/06/2023 Technical and / or Professional components performed at [facility]. [facility], [address] [place], [place] [address].  Immunohistochemistry Technical component (if applicable) was performed at [facility]. [address], [place], [place] [address].   IMMUNOHISTOCHEMISTRY DISCLAIMER (if applicable): Some of these immunohistochemical stains may have been devel oped and the performance characteristics determine by [facility]. Some may not have been cleared or approved by the U.S. Food and Drug Administration. The FDA has determined that such clearance or approval is not necessary. This test is used for clinical purposes. It should not be regarded as  investigational or for research. This laboratory is certified under the Clinical Laboratory Improvement Amendments of 1988 (CLIA-88) as qualified to perform high complexity clinical laboratory testing.  The controls stained appropriately.   IHC stains are performed on  formalin fixed, paraffin embedded tissue using a 3,3"diaminobenzidine (DAB) chromogen and Leica Bond Autostainer System. The staining intensity of the nucleus is score manually and is reported as the percentage of tumor cell nuclei demonstrating specific nuclear staining. The specimens are fixed in 10% Neutral Formalin for at least 6 hours and up to 72hrs. These tests are validated on decalcified tissue. R esults should be interpreted with caution given the possibility of false negative results on decalcified specimens. Antibody Clones are as follows ER-clone 68F, PR-clone 16, Ki67- clone MM1. Some of these immunohistochemical stains may have been developed and the performance characteristics determined by [facility].   Fungus Culture With Stain     Status: None   Collection Time: 05/05/23 10:31 AM   Specimen: Bronchial Alveolar Lavage; Respiratory  Result Value Ref Range   Fungus Stain Final report    Fungus (Mycology) Culture Final report     Comment: (NOTE) Performed At: [facility] [address] [place], [place] [id number] Jolene Schimke MD AO:[id number]    Fungal Source RUL BAL     Comment: Performed at [facility], [address], [place], [place] [address]  Culture, BAL-quantitative w Gram Stain     Status: Abnormal   Collection Time: 05/05/23 10:31 AM   Specimen: Bronchial Alveolar Lavage; Respiratory  Result Value Ref Range   Specimen Description BRONCHIAL ALVEOLAR LAVAGE    Special Requests RUL    Gram Stain      FEW WBC PRESENT,BOTH PMN AND MONONUCLEAR RARE SQUAMOUS EPITHELIAL CELLS PRESENT NO ORGANISMS SEEN    Culture (A)     10,000 COLONIES/mL Normal respiratory flora-no  Staph aureus or Pseudomonas seen Performed at [facility], [address] [place], [place] [address]    Report Status 05/07/2023 FINAL   Aerobic/Anaerobic Culture w Gram Stain (surgical/deep wound)     Status: None   Collection Time: 05/05/23 10:31 AM   Specimen: Bronchial Alveolar Lavage; Respiratory  Result Value Ref Range   Specimen Description BRONCHIAL ALVEOLAR LAVAGE    Special Requests RUL    Gram Stain      FEW WBC PRESENT,BOTH PMN AND MONONUCLEAR RARE SQUAMOUS EPITHELIAL CELLS PRESENT NO ORGANISMS SEEN    Culture      RARE Consistent with normal respiratory flora. NO ANAEROBES ISOLATED Performed at [facility], [address], [place], [place] [address]    Report Status 05/10/2023 FINAL   Acid Fast Smear (AFB)     Status: None   Collection Time: 05/05/23 10:31 AM   Specimen: Bronchial Alveolar Lavage; Respiratory  Result Value Ref Range   AFB Specimen Processing Concentration    Acid Fast Smear Negative     Comment: (NOTE) Performed At: [facility] [address] [place], [place] [id number] Jolene Schimke MD QI:[id number]    Source (AFB) RUL BAL     Comment: Performed at [facility], [address], [place], [place] [address]  Fungus Culture Result     Status: None   Collection Time: 05/05/23 10:31 AM  Result Value Ref Range   Result 1 Comment     Comment: (NOTE) KOH/Calcofluor preparation:  no fungus observed. Performed At: [facility] [address] [place], [place] [id number] Jolene Schimke MD ZY:[id number]   Fungal organism reflex     Status: None   Collection Time: 05/05/23 10:31 AM  Result Value Ref Range   Fungal result 1 Comment     Comment: (NOTE) No yeast or mold isolated  after 4 weeks. Performed At: [facility] [address] [place], [place] [id number] Jolene Schimke MD HQ:[id number]      PFT      No data to display             has a past medical history of Anemia, Anxiety,  Asthma, Chronic back pain, COPD (chronic obstructive pulmonary disease) (HCC), COVID-19, Depression, Fibromyalgia, GERD (gastroesophageal reflux disease), Headache, Hyperlipidemia, Pseudoaneurysm (HCC), Rheumatoid arthritis (HCC), and Sleep apnea.   reports that she has been smoking cigarettes. She started smoking about 46 years ago. She has a 139.6 pack-year smoking history. She has never used smokeless tobacco.  Past Surgical History:  Procedure Laterality Date   ABDOMINAL HYSTERECTOMY     APPENDECTOMY     BACK SURGERY     BRONCHIAL BIOPSY  05/05/2023   Procedure: BRONCHIAL BIOPSIES;  Surgeon: Josephine Igo, DO;  Location: [facility];  Service: Thoracic;;   BRONCHIAL BRUSHINGS  05/05/2023   Procedure: BRONCHIAL BRUSHINGS;  Surgeon: Josephine Igo, DO;  Location: [facility];  Service: Thoracic;;   BRONCHIAL NEEDLE ASPIRATION BIOPSY  05/05/2023   Procedure: BRONCHIAL NEEDLE ASPIRATION BIOPSIES;  Surgeon: Josephine Igo, DO;  Location: [facility];  Service: Thoracic;;   BRONCHIAL WASHINGS  05/05/2023   Procedure: BRONCHIAL WASHINGS;  Surgeon: Josephine Igo, DO;  Location: [facility];  Service: Thoracic;;   CHOLECYSTECTOMY     LAPAROSCOPIC GASTRIC SLEEVE RESECTION N/A 01/07/2021   Procedure: LAPAROSCOPIC GASTRIC SLEEVE RESECTION AND HIATAL HERNIA REPAIR;  Surgeon: Luretha Murphy, MD;  Location: [facility];  Service: General;  Laterality: N/A;   OVARIAN CYST REMOVAL     UPPER GI ENDOSCOPY N/A 01/07/2021   Procedure: UPPER GI ENDOSCOPY;  Surgeon: Luretha Murphy, MD;  Location: [facility];  Service: General;  Laterality: N/A;    Allergies  Allergen Reactions   Lithium Shortness Of Breath, Nausea And Vomiting, Nausea Only and Rash    Difficulty breathing   Aspirin Rash    Immunization History  Administered Date(s) Administered   Influenza Split 09/14/2017   Influenza-Unspecified 10/17/2015   Moderna Sars-Covid-2 Vaccination 06/28/2020, 08/11/2020    Family History  Problem Relation  Age of Onset   COPD Mother    Emphysema Mother    Hypertension Father      Current Outpatient Medications:    albuterol (PROVENTIL) (2.5 MG/3ML) 0.083% nebulizer solution, Take 3 mLs (2.5 mg total) by nebulization every 6 (six) hours as needed for wheezing or shortness of breath., Disp: 75 mL, Rfl: 12   albuterol (VENTOLIN HFA) 108 (90 Base) MCG/ACT inhaler, Inhale 2 puffs into the lungs every 6 (six) hours as needed for wheezing or shortness of breath., Disp: , Rfl:    Budeson-Glycopyrrol-Formoterol (BREZTRI AEROSPHERE) 160-9-4.8 MCG/ACT AERO, Inhale 2 puffs into the lungs in the morning and at bedtime., Disp: 10.7 g, Rfl: 6   Calcium Carb-Cholecalciferol (CALCIUM 500 + D3 PO), Place 1 patch onto the skin daily. Patch Aid Vitamin D3 + Calcium Patch, Disp: , Rfl:    cyclobenzaprine (FLEXERIL) 10 MG tablet, Take 10 mg by mouth 3 (three) times daily as needed for muscle spasms., Disp: , Rfl:    ergocalciferol (VITAMIN D2) 1.25 MG (50000 UT) capsule, Take 50,000 Units by mouth every Sunday., Disp: , Rfl:    estradiol (VIVELLE-DOT) 0.0375 MG/24HR, Place 1 patch onto the skin 2 (two) times a week. Sundays & Thursdays., Disp: , Rfl:    folic acid (FOLVITE) 1 MG tablet, Take 1 mg by mouth in  the morning., Disp: , Rfl:    gabapentin (NEURONTIN) 300 MG capsule, Take 300 mg by mouth 3 (three) times daily as needed (nerve pain)., Disp: , Rfl:    meloxicam (MOBIC) 7.5 MG tablet, Take 7.5 mg by mouth 2 (two) times daily as needed (pain.)., Disp: , Rfl:    MULTIPLE VITAMINS-IRON PO, Place 1 patch onto the skin daily. Patch Aid Iron Plus Multivitamin Patch, Disp: , Rfl:    omeprazole (PRILOSEC) 40 MG capsule, Take 40 mg by mouth daily before breakfast., Disp: , Rfl:    oxyCODONE-acetaminophen (PERCOCET) 10-325 MG tablet, Take 1 tablet by mouth in the morning, at noon, and at bedtime., Disp: , Rfl:    rosuvastatin (CRESTOR) 10 MG tablet, Take 10 mg by mouth at bedtime., Disp: , Rfl:    SUMAtriptan (IMITREX)  100 MG tablet, Take 100 mg by mouth every 2 (two) hours as needed for migraine., Disp: , Rfl:    TRULANCE 3 MG TABS, Take 3 mg by mouth in the morning., Disp: , Rfl:    varenicline (CHANTIX) 0.5 MG tablet, Take 1 tablet (0.5 mg total) by mouth daily for 3 days, THEN 1 tablet (0.5 mg total) 2 (two) times daily for 3 days, THEN 1 tablet (0.5 mg total) daily., Disp: 86 tablet, Rfl: 0   buPROPion (WELLBUTRIN SR) 150 MG 12 hr tablet, Take 1 tablet (150 mg total) by mouth 2 (two) times daily. One tab per day for 3 days then increase to twice daily (Patient not taking: Reported on 05/01/2023), Disp: 60 tablet, Rfl: 2   predniSONE (DELTASONE) 10 MG tablet, Take 4 tabs by mouth for 3 days, then 3 for 3 days, 2 for 3 days, 1 for 3 days and stop (Patient not taking: Reported on 05/12/2023), Disp: 30 tablet, Rfl: 0      Objective:   Vitals:   06/16/23 0848  BP: 128/84  Pulse: (!) 51  Temp: 98.6 F (37 C)  TempSrc: Oral  SpO2: 100%  Weight: 198 lb 6.4 oz (90 kg)  Height: 5\' 4"  (1.626 m)    Estimated body mass index is 34.06 kg/m as calculated from the following:   Height as of this encounter: 5\' 4"  (1.626 m).   Weight as of this encounter: 198 lb 6.4 oz (90 kg).  @WEIGHTCHANGE @  [employer]   06/16/23 0848  Weight: 198 lb 6.4 oz (90 kg)     Physical Exam   General: No distress. Looks well. Smells of tobacoo O2 at rest: no Cane present: no Sitting in wheel chair: no Frail: no Obese: no Neuro: Alert and Oriented x 3. GCS 15. Speech normal Psych: Pleasant Resp:  Barrel Chest - n.  Wheeze - on, Crackles - o, No overt respiratory distress CVS: Normal heart sounds. Murmurs - no Ext: Stigmata of Connective Tissue Disease - NO HEENT: Normal upper airway. PEERL +. 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 Encounter for smoking cessation counseling  Z71.6     4. Tobacco abuse  Z72.0  5. Atypical chest pain  R07.89 ECHOCARDIOGRAM COMPLETE    6. DOE (dyspnea on exertion)  R06.09 ECHOCARDIOGRAM COMPLETE    7. 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 Encounter for smoking cessation counseling  Z71.6     4. Tobacco abuse  Z72.0     5. Atypical chest pain  R07.89     6. DOE (dyspnea on exertion)  R06.09     7. 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  Diagnosis based on age, smoking, features on the CT scan and nondiagnostic bronchoscopy.  This condition typically improves/cures with quitting smoking but will progress with continued smoking  - There is also mild inflammation along with HX and we believe this is also smoking-related  -There is barely this condition in 2019 and is definitely more prominent right now  Plan - See quit smoking instructions below - Keep pulmonary function test appointment in mid August 2024 and then every 3 months -Hold off on surgical lung biopsy at this point [shared decision making] -Consider surgical lung biopsy based on course -Get blood work to complete the workup and to ensure no other causes such as autoimmune disease  -Do ANA, sedimentation rate, angiotensin-converting enzyme, double-stranded DNA, rheumatoid factor, SCL-70, SSA, SSB  -Do QuantiFERON gold  Encounter for smoking cessation counseling Tobacco abuse  -Current focus is to quit smoking and this can be an  extremely challenging task but I believe you are up to it  Plan - -  start chantix as directed  - Days 1-3: 0.5 mg once daily  - Days 4-7: 0.5 mg twice daily  - Maintenance (? Day 8):  1 mg twice daily for 11 weeks   - if you have bad dream stop medication and call us  - take the medicaiton as instructed and after food -Reduce smoking by 1 cigarette every 2 or 3 days and then quit smoking in 4 weeks   Atypical chest pain DOE (dyspnea on exertion) Chronic cough  -All the symptoms are very likely due to HX and the lung inflammation but we do need to rule out any bony issues that can happen with HX  Plan  - Order bone scan based on 2nd opinion discussion with Dr Dorothey Baseman -Get echocardiogram - COntinue BREZTRI  Followup  -= APP in 4 weeks to reviuew results and quit smoking effort   FOLLOWUP Return in about 4 weeks (around 07/14/2023) for 30 min visit, after Cleda Daub and DLCO, wit Kandice Robinsons and reviw resulsts.  ( Level 05 visit E&M 2024: Estb >= 40 min  visit type: on-site physical face to visit  in total care time and counseling or/and coordination of care by this undersigned MD - Dr Kalman Shan. This includes one or more of the following on this same day 06/16/2023: pre-charting, chart review, note writing, documentation discussion of test results, diagnostic or treatment recommendations, prognosis, risks and benefits of management options, instructions, education, compliance or risk-factor reduction. It excludes time spent by the CMA or office staff in the care of the patient. Actual time 45 min)   ALso > 12 min  spent in quitting smoking counseling and discussion (addendum)  SIGNATURE    Dr. Kalman Shan, M.D., F.C.C.P,  Pulmonary and Critical Care Medicine Staff Physician, [facility] Director - Interstitial Lung Disease  Program  [facility] at [facility] [place], [place], [address]  Pager: [phone], If  no answer or between  15:00h - 7:00h: call [phone] Telephone: [phone]  7:13 PM 06/16/2023

## 2023-06-16 NOTE — Patient Instructions (Addendum)
ICD-10-CM   1. 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 Encounter for smoking cessation counseling  Z71.6     4. Tobacco abuse  Z72.0     5. Atypical chest pain  R07.89     6. DOE (dyspnea on exertion)  R06.09     7. 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  Diagnosis based on age, smoking, features on the CT scan and nondiagnostic bronchoscopy.  This condition typically improves/cures with quitting smoking but will progress with continued smoking  - There is also mild inflammation along with HX and we believe this is also smoking-related  -There is barely this condition in 2019 and is definitely more prominent right now  Plan - See quit smoking instructions below - Keep pulmonary function test appointment in mid August 2024 and then every 3 months -Hold off on surgical lung biopsy at this point [shared decision making] -Consider surgical lung biopsy based on course -Get blood work to complete the workup and to ensure no other causes such as autoimmune disease  -Do ANA, sedimentation rate, angiotensin-converting enzyme, double-stranded DNA, rheumatoid factor, SCL-70, SSA, SSB  -Do QuantiFERON gold  Encounter for smoking cessation counseling Tobacco abuse  -Current focus is to quit smoking and this can be an extremely challenging task but I believe you are up to it  Plan - -  start chantix as directed  - Days 1-3: 0.5 mg once daily  - Days 4-7: 0.5 mg twice daily  - Maintenance (? Day 8):  1 mg twice daily for 11 weeks   - if you have bad dream stop medication and call us  - take the medicaiton as instructed and after food -Reduce smoking by 1 cigarette every 2 or 3 days and then quit smoking in 4 weeks   Atypical chest pain DOE (dyspnea on exertion) Chronic  cough  -All the symptoms are very likely due to HX and the lung inflammation but we do need to rule out any bony issues that can happen with HX  Plan  - Order bone scan based on 2nd opinion discussion with Dr Dorothey Baseman -Get echocardiogram - COntinue BREZTRI  Followup  -= APP in 4 weeks to reviuew results and quit smoking effort

## 2023-06-23 ENCOUNTER — Encounter (HOSPITAL_COMMUNITY): Payer: Self-pay

## 2023-06-30 ENCOUNTER — Telehealth (HOSPITAL_COMMUNITY): Payer: Self-pay | Admitting: Internal Medicine

## 2023-06-30 NOTE — Telephone Encounter (Signed)
Just an FYI. We have made several attempts to contact this patient including sending a letter to schedule or reschedule their echocardiogram. We will be removing the patient from the echo WQ.   06/23/23 Message sent thru [facility] CHART/LBW  06/23/23 called and te# no longer in service @ 12:01/LBW        Thank you

## 2023-07-20 ENCOUNTER — Ambulatory Visit (INDEPENDENT_AMBULATORY_CARE_PROVIDER_SITE_OTHER): Payer: Medicare HMO | Admitting: Pulmonary Disease

## 2023-07-20 DIAGNOSIS — J849 Interstitial pulmonary disease, unspecified: Secondary | ICD-10-CM

## 2023-07-20 DIAGNOSIS — Z716 Tobacco abuse counseling: Secondary | ICD-10-CM

## 2023-07-20 DIAGNOSIS — R918 Other nonspecific abnormal finding of lung field: Secondary | ICD-10-CM

## 2023-07-20 LAB — PULMONARY FUNCTION TEST
DL/VA % pred: 72 %
DL/VA: 3.08 ml/min/mmHg/L
DLCO cor % pred: 55 %
DLCO cor: 11.49 ml/min/mmHg
DLCO unc % pred: 55 %
DLCO unc: 11.49 ml/min/mmHg
FEF 25-75 Post: 1.62 L/sec
FEF 25-75 Pre: 1.21 L/sec
FEF2575-%Change-Post: 33 %
FEF2575-%Pred-Post: 64 %
FEF2575-%Pred-Pre: 48 %
FEV1-%Change-Post: 6 %
FEV1-%Pred-Post: 68 %
FEV1-%Pred-Pre: 63 %
FEV1-Post: 1.81 L
FEV1-Pre: 1.7 L
FEV1FVC-%Change-Post: 8 %
FEV1FVC-%Pred-Pre: 91 %
FEV6-%Change-Post: -1 %
FEV6-%Pred-Post: 69 %
FEV6-%Pred-Pre: 70 %
FEV6-Post: 2.3 L
FEV6-Pre: 2.34 L
FEV6FVC-%Pred-Post: 103 %
FEV6FVC-%Pred-Pre: 103 %
FVC-%Change-Post: -1 %
FVC-%Pred-Post: 67 %
FVC-%Pred-Pre: 68 %
FVC-Post: 2.3 L
FVC-Pre: 2.34 L
Post FEV1/FVC ratio: 79 %
Post FEV6/FVC ratio: 100 %
Pre FEV1/FVC ratio: 73 %
Pre FEV6/FVC Ratio: 100 %

## 2023-07-20 NOTE — Progress Notes (Signed)
PFT done today. 

## 2023-08-07 ENCOUNTER — Ambulatory Visit (INDEPENDENT_AMBULATORY_CARE_PROVIDER_SITE_OTHER): Payer: Medicare HMO | Admitting: Plastic Surgery

## 2023-08-07 VITALS — BP 135/70 | HR 75 | Resp 20

## 2023-08-07 DIAGNOSIS — G8929 Other chronic pain: Secondary | ICD-10-CM

## 2023-08-07 DIAGNOSIS — M793 Panniculitis, unspecified: Secondary | ICD-10-CM

## 2023-08-07 DIAGNOSIS — E669 Obesity, unspecified: Secondary | ICD-10-CM

## 2023-08-07 DIAGNOSIS — M546 Pain in thoracic spine: Secondary | ICD-10-CM | POA: Diagnosis not present

## 2023-08-07 DIAGNOSIS — R21 Rash and other nonspecific skin eruption: Secondary | ICD-10-CM

## 2023-08-07 DIAGNOSIS — M549 Dorsalgia, unspecified: Secondary | ICD-10-CM | POA: Insufficient documentation

## 2023-08-07 DIAGNOSIS — M542 Cervicalgia: Secondary | ICD-10-CM | POA: Diagnosis not present

## 2023-08-07 DIAGNOSIS — F17210 Nicotine dependence, cigarettes, uncomplicated: Secondary | ICD-10-CM

## 2023-08-07 NOTE — Progress Notes (Signed)
   Subjective:    Patient ID: Brittney Sanford, female    DOB: 01-Dec-1966, 57 y.o.   MRN: [id number]  The patient is a 57 year old female here for follow-up on her abdomen.  She is 5 feet 4 inches tall and weighs 190 pounds.  She had gastric bypass and lost over 100 pounds.  She has been stable with her weight.  She complains of back pain and rashes in her skin folds.  She has skin breakdown and irritation.  She has tried creams and powders with only temporary help.  Her past surgical history includes an appendectomy, hysterectomy and a cholecystectomy.  She does not have diabetes and is not on a blood thinner.  She is a smoker and has decreased her cigarettes to only 3 per day.  Her past medical history includes anxiety, depression, rheumatoid arthritis, chronic back pain, vertebral artery pseudoaneurysm, hyperlipidemia and COPD.  She is still interested in a panniculectomy.      Review of Systems  Constitutional:  Positive for activity change.  Eyes: Negative.   Respiratory: Negative.    Cardiovascular: Negative.   Gastrointestinal: Negative.   Endocrine: Negative.   Musculoskeletal:  Positive for back pain and neck pain.  Skin:  Positive for rash.       Objective:   Physical Exam Vitals and nursing note reviewed.  Constitutional:      Appearance: Normal appearance.  HENT:     Head: Normocephalic and atraumatic.  Cardiovascular:     Rate and Rhythm: Normal rate.     Pulses: Normal pulses.  Pulmonary:     Effort: Pulmonary effort is normal.  Abdominal:     General: There is no distension.     Palpations: Abdomen is soft.     Tenderness: There is no abdominal tenderness.  Musculoskeletal:        General: No swelling or deformity.  Skin:    General: Skin is warm.     Capillary Refill: Capillary refill takes less than 2 seconds.     Coloration: Skin is not jaundiced.     Findings: No bruising or lesion.  Neurological:     Mental Status: She is alert and oriented to person,  place, and time.  Psychiatric:        Mood and Affect: Mood normal.        Behavior: Behavior normal.        Thought Content: Thought content normal.        Judgment: Judgment normal.         Assessment & Plan:     ICD-10-CM   1. Panniculitis  M79.3     2. Obesity, unspecified classification, unspecified obesity type, unspecified whether serious comorbidity present  E66.9     3. Chronic bilateral thoracic back pain  M54.6    G89.29        The patient is still a good candidate for panniculectomy.  I have encouraged her that I think that she would do well with the surgery if she can stop smoking and be 3 months tobacco free.  She is going to continue to work on it and then let us know and come back and see [place].

## 2023-08-12 ENCOUNTER — Emergency Department (HOSPITAL_COMMUNITY)
Admission: EM | Admit: 2023-08-12 | Discharge: 2023-08-12 | Disposition: A | Discharge status: Home / Self Care | Payer: Medicare HMO | Attending: Emergency Medicine | Admitting: Emergency Medicine

## 2023-08-12 ENCOUNTER — Other Ambulatory Visit: Payer: Self-pay

## 2023-08-12 ENCOUNTER — Encounter (HOSPITAL_COMMUNITY): Payer: Self-pay

## 2023-08-12 ENCOUNTER — Emergency Department (HOSPITAL_COMMUNITY): Payer: Medicare HMO

## 2023-08-12 DIAGNOSIS — Z7951 Long term (current) use of inhaled steroids: Secondary | ICD-10-CM | POA: Insufficient documentation

## 2023-08-12 DIAGNOSIS — R519 Headache, unspecified: Secondary | ICD-10-CM

## 2023-08-12 DIAGNOSIS — R112 Nausea with vomiting, unspecified: Secondary | ICD-10-CM | POA: Insufficient documentation

## 2023-08-12 DIAGNOSIS — R42 Dizziness and giddiness: Secondary | ICD-10-CM | POA: Diagnosis present

## 2023-08-12 DIAGNOSIS — J449 Chronic obstructive pulmonary disease, unspecified: Secondary | ICD-10-CM | POA: Insufficient documentation

## 2023-08-12 LAB — I-STAT CHEM 8, ED
BUN: 7 mg/dL (ref 6–20)
Calcium, Ion: 1.3 mmol/L (ref 1.15–1.40)
Chloride: 108 mmol/L (ref 98–111)
Creatinine, Ser: 0.8 mg/dL (ref 0.44–1.00)
Glucose, Bld: 84 mg/dL (ref 70–99)
HCT: 40 % (ref 36.0–46.0)
Hemoglobin: 13.6 g/dL (ref 12.0–15.0)
Potassium: 4 mmol/L (ref 3.5–5.1)
Sodium: 140 mmol/L (ref 135–145)
TCO2: 24 mmol/L (ref 22–32)

## 2023-08-12 LAB — DIFFERENTIAL
Abs Immature Granulocytes: 0.01 10*3/uL (ref 0.00–0.07)
Basophils Absolute: 0 10*3/uL (ref 0.0–0.1)
Basophils Relative: 1 %
Eosinophils Absolute: 0.1 10*3/uL (ref 0.0–0.5)
Eosinophils Relative: 1 %
Immature Granulocytes: 0 %
Lymphocytes Relative: 31 %
Lymphs Abs: 2.1 10*3/uL (ref 0.7–4.0)
Monocytes Absolute: 0.4 10*3/uL (ref 0.1–1.0)
Monocytes Relative: 6 %
Neutro Abs: 4.1 10*3/uL (ref 1.7–7.7)
Neutrophils Relative %: 61 %

## 2023-08-12 LAB — CBG MONITORING, ED: Glucose-Capillary: 85 mg/dL (ref 70–99)

## 2023-08-12 LAB — COMPREHENSIVE METABOLIC PANEL
ALT: 15 U/L (ref 0–44)
AST: 22 U/L (ref 15–41)
Albumin: 3.9 g/dL (ref 3.5–5.0)
Alkaline Phosphatase: 78 U/L (ref 38–126)
Anion gap: 6 (ref 5–15)
BUN: 9 mg/dL (ref 6–20)
CO2: 24 mmol/L (ref 22–32)
Calcium: 9.5 mg/dL (ref 8.9–10.3)
Chloride: 106 mmol/L (ref 98–111)
Creatinine, Ser: 0.82 mg/dL (ref 0.44–1.00)
GFR, Estimated: >60 mL/min (ref >60)
Glucose, Bld: 88 mg/dL (ref 70–99)
Potassium: 4 mmol/L (ref 3.5–5.1)
Sodium: 136 mmol/L (ref 135–145)
Total Bilirubin: 0.5 mg/dL (ref 0.3–1.2)
Total Protein: 7.8 g/dL (ref 6.5–8.1)

## 2023-08-12 LAB — CBC
HCT: 41.2 % (ref 36.0–46.0)
Hemoglobin: 13.8 g/dL (ref 12.0–15.0)
MCH: 30.6 pg (ref 26.0–34.0)
MCHC: 33.5 g/dL (ref 30.0–36.0)
MCV: 91.4 fL (ref 80.0–100.0)
Platelets: 170 10*3/uL (ref 150–400)
RBC: 4.51 MIL/uL (ref 3.87–5.11)
RDW: 14.3 % (ref 11.5–15.5)
WBC: 6.6 10*3/uL (ref 4.0–10.5)
nRBC: 0 % (ref 0.0–0.2)

## 2023-08-12 MED ORDER — IOHEXOL 350 MG/ML SOLN
80.0000 mL | Freq: Once | INTRAVENOUS | Status: AC | PRN
  Administered 2023-08-12: 80 mL via INTRAVENOUS

## 2023-08-12 MED ORDER — SODIUM CHLORIDE (PF) 0.9 % IJ SOLN
INTRAMUSCULAR | Status: AC
  Filled 2023-08-12: qty 50

## 2023-08-12 MED ORDER — MECLIZINE HCL 25 MG PO TABS: 25.0000 mg | ORAL_TABLET | Freq: Three times a day (TID) | ORAL | 0 refills | Status: AC | PRN

## 2023-08-12 MED ORDER — ONDANSETRON HCL 4 MG/2ML IJ SOLN
4.0000 mg | Freq: Once | INTRAMUSCULAR | Status: AC
  Administered 2023-08-12: 4 mg via INTRAVENOUS
  Filled 2023-08-12: qty 2

## 2023-08-12 NOTE — ED Provider Notes (Signed)
[place] EMERGENCY DEPARTMENT AT [facility] Provider Note   CSN: [id number] Arrival date & time: 08/12/23  8657     History  Chief Complaint  Patient presents with   Dizziness    Brittney Sanford is a 57 y.o. female.   Dizziness    Patient has a history of COPD, fibromyalgia, pseudoaneurysm, anxiety, headaches.  Patient states she has been having issues off and on for the last several weeks.  Patient's had headaches, intermittent nausea vomiting, dizziness.  Patient initially thought it might be her vertigo but this feels different.  This morning she felt more off balance again so she came to the ED for evaluation.  Feels lightheaded as if she is going to fall.  She does not have any focal numbness or weakness.  No fevers or chills.  She does have history of headaches but not regularly.  Home Medications Prior to Admission medications   Medication Sig Start Date End Date Taking? Authorizing Provider  meclizine (ANTIVERT) 25 MG tablet Take 1 tablet (25 mg total) by mouth 3 (three) times daily as needed for dizziness. 08/12/23  Yes Linwood Dibbles, MD  albuterol (PROVENTIL) (2.5 MG/3ML) 0.083% nebulizer solution Take 3 mLs (2.5 mg total) by nebulization every 6 (six) hours as needed for wheezing or shortness of breath. 04/09/23   Icard, Rachel Bo, DO  albuterol (VENTOLIN HFA) 108 (90 Base) MCG/ACT inhaler Inhale 2 puffs into the lungs every 6 (six) hours as needed for wheezing or shortness of breath.    [provider]  buPROPion (WELLBUTRIN SR) 150 MG 12 hr tablet Take 1 tablet (150 mg total) by mouth 2 (two) times daily. One tab per day for 3 days then increase to twice daily Patient not taking: Reported on 05/01/2023 04/09/23 07/08/23  Josephine Igo, DO  Calcium Carb-Cholecalciferol (CALCIUM 500 + D3 PO) Place 1 patch onto the skin daily. Patch Aid Vitamin D3 + Calcium Patch    [provider]  cyclobenzaprine (FLEXERIL) 10 MG tablet Take 10 mg by mouth 3 (three)  times daily as needed for muscle spasms.    [provider]  ergocalciferol (VITAMIN D2) 1.25 MG (50000 UT) capsule Take 50,000 Units by mouth every Sunday.    [provider]  estradiol (VIVELLE-DOT) 0.0375 MG/24HR Place 1 patch onto the skin 2 (two) times a week. Sundays & Thursdays.    [provider]  folic acid (FOLVITE) 1 MG tablet Take 1 mg by mouth in the morning.    [provider]  gabapentin (NEURONTIN) 300 MG capsule Take 300 mg by mouth 3 (three) times daily as needed (nerve pain).    [provider]  meloxicam (MOBIC) 7.5 MG tablet Take 7.5 mg by mouth 2 (two) times daily as needed (pain.).    [provider]  MULTIPLE VITAMINS-IRON PO Place 1 patch onto the skin daily. Patch Aid Iron Plus Multivitamin Patch    [provider]  omeprazole (PRILOSEC) 40 MG capsule Take 40 mg by mouth daily before breakfast. 09/16/22   [provider]  oxyCODONE-acetaminophen (PERCOCET) 10-325 MG tablet Take 1 tablet by mouth in the morning, at noon, and at bedtime. 12/27/15   [provider]  predniSONE (DELTASONE) 10 MG tablet Take 4 tabs by mouth for 3 days, then 3 for 3 days, 2 for 3 days, 1 for 3 days and stop Patient not taking: Reported on 05/12/2023 05/09/23   Omar Person, MD  rosuvastatin (CRESTOR) 10 MG tablet Take  10 mg by mouth at bedtime.    [provider]  SUMAtriptan (IMITREX) 100 MG tablet Take 100 mg by mouth every 2 (two) hours as needed for migraine. 10/08/20   [provider]  TRULANCE 3 MG TABS Take 3 mg by mouth in the morning.    [provider]  varenicline (CHANTIX) 0.5 MG tablet Take 1 tablet (0.5 mg total) by mouth daily for 3 days, THEN 1 tablet (0.5 mg total) 2 (two) times daily for 3 days, THEN 1 tablet (0.5 mg total) daily. 06/16/23 09/07/23  Kalman Shan, MD      Allergies    Lithium and Aspirin    Review of Systems   Review of Systems  Neurological:   Positive for dizziness.    Physical Exam Updated Vital Signs BP 124/89   Pulse (!) 43   Temp 97.7 F (36.5 C) (Oral)   Resp 15   Ht 1.626 m (5\' 4" )   Wt 89.8 kg   SpO2 94%   BMI 33.99 kg/m  Physical Exam Vitals and nursing note reviewed.  Constitutional:      General: She is not in acute distress.    Appearance: She is well-developed.  HENT:     Head: Normocephalic and atraumatic.     Right Ear: External ear normal.     Left Ear: External ear normal.  Eyes:     General: No visual field deficit or scleral icterus.       Right eye: No discharge.        Left eye: No discharge.     Conjunctiva/sclera: Conjunctivae normal.  Neck:     Trachea: No tracheal deviation.  Cardiovascular:     Rate and Rhythm: Normal rate and regular rhythm.  Pulmonary:     Effort: Pulmonary effort is normal. No respiratory distress.     Breath sounds: Normal breath sounds. No stridor. No wheezing or rales.  Abdominal:     General: Bowel sounds are normal. There is no distension.     Palpations: Abdomen is soft.     Tenderness: There is no abdominal tenderness. There is no guarding or rebound.  Musculoskeletal:        General: No tenderness.     Cervical back: Neck supple. No rigidity or tenderness.  Skin:    General: Skin is warm and dry.     Findings: No rash.  Neurological:     Mental Status: She is alert and oriented to person, place, and time.     Cranial Nerves: No cranial nerve deficit, dysarthria or facial asymmetry.     Sensory: No sensory deficit.     Motor: No abnormal muscle tone, seizure activity or pronator drift.     Coordination: Coordination normal.     Comments:  able to hold both legs off bed for 5 seconds, sensation intact in all extremities,  no left or right sided neglect, normal finger-nose exam bilaterally, no nystagmus noted   Psychiatric:        Mood and Affect: Mood normal.     ED Results / Procedures / Treatments   Labs (all labs ordered are listed, but only  abnormal results are displayed) Labs Reviewed  CBC  DIFFERENTIAL  COMPREHENSIVE METABOLIC PANEL  CBG MONITORING, ED  I-STAT CHEM 8, ED    EKG EKG Interpretation Date/Time:  Wednesday August 12 2023 08:33:02 EDT Ventricular Rate:  65 PR Interval:  155 QRS Duration:  95 QT Interval:  394 QTC Calculation: 410 R  Axis:   65  Text Interpretation: Sinus rhythm Low voltage, precordial leads Baseline wander in lead(s) I III aVL No significant change since last tracing Confirmed by Linwood Dibbles [phone]) on 08/12/2023 9:12:14 AM  Radiology CT Angio Head W or Wo Contrast  Result Date: 08/12/2023 CLINICAL DATA:  Cerebral aneurysm, untreated. Headache with increasing frequency or severity. Dizziness. EXAM: CT ANGIOGRAPHY HEAD TECHNIQUE: Multidetector CT imaging of the head was performed using the standard protocol during bolus administration of intravenous contrast. Multiplanar CT image reconstructions and MIPs were obtained to evaluate the vascular anatomy. RADIATION DOSE REDUCTION: This exam was performed according to the departmental dose-optimization program which includes automated exposure control, adjustment of the mA and/or kV according to patient size and/or use of iterative reconstruction technique. CONTRAST:  80mL OMNIPAQUE IOHEXOL 350 MG/ML SOLN COMPARISON:  08/20/2022 FINDINGS: CT HEAD Brain: The brain shows a normal appearance without evidence of malformation, atrophy, old or acute small or large vessel infarction, mass lesion, hemorrhage, hydrocephalus or extra-axial collection. Vascular: No hyperdense vessel. No evidence of atherosclerotic calcification. Skull: Normal.  No traumatic finding.  No focal bone lesion. Sinuses/Orbits: Sinuses are clear. Orbits appear normal. Mastoids are clear. Other: None significant CTA HEAD Anterior circulation: Both internal carotid arteries are widely patent through the skull base and siphon regions. The anterior and middle cerebral vessels are normal. No  occlusion, stenosis, aneurysm or vascular malformation. Posterior circulation: CT angiography of the head does not include the neck. Examination begins above the level of the previously seen C2 left vertebral artery aneurysm, and this cannot be evaluated. The upper vertebral arteries are normal. Both vertebral arteries reach the basilar artery. No basilar stenosis. Posterior circulation branch vessels are normal. Venous sinuses: Patent and normal. Anatomic variants: None significant. Review of the MIP images confirms the above findings. IMPRESSION: 1. Normal head CT. 2. Normal CT angiography of the intracranial circulation. 3. CT angiography of the head does not include the neck. The previously seen C2 level left vertebral artery aneurysm cannot be evaluated. Given the normal appearance of the head CT and the normal intracranial CT angiography, it would seem unlikely that the symptoms would relate to the previously seen left distal vertebral artery aneurysm. However, if concern persists, CT angiography of the neck or MR angiography of the neck could be performed. Electronically Signed   By: Paulina Fusi M.D.   On: 08/12/2023 11:19    Procedures Procedures    Medications Ordered in ED Medications  iohexol (OMNIPAQUE) 350 MG/ML injection 80 mL (80 mLs Intravenous Contrast Given 08/12/23 1017)  ondansetron (ZOFRAN) injection 4 mg (4 mg Intravenous Given 08/12/23 1037)    ED Course/ Medical Decision Making/ A&P Clinical Course as of 08/12/23 1135  Wed Aug 12, 2023  1123 CBC metabolic panel normal. [JK]  1124 CT angio of the head does not show any acute abnormality [JK]  1132 Resting bradycardia noted on the bedside monitor.  Sinus rhythm.  Patient asymptomatic [JK]    Clinical Course User Index [JK] Linwood Dibbles, MD                                 Medical Decision Making Problems Addressed: Dizziness: acute illness or injury that poses a threat to life or bodily functions Nonintractable headache,  unspecified chronicity pattern, unspecified headache type: acute illness or injury  Amount and/or Complexity of Data Reviewed Labs: ordered. Radiology: ordered.  Risk Prescription drug management.  Patient presented with complaints of dizziness headache.  Patient has history of vertebral artery aneurysm.  On exam patient does not have any focal neurologic deficits.  Low suspicion for stroke.  Labs do not show any signs of any significant electrolyte abnormality.  No anemia.  Patient has a normal cardiac rhythm.  She did have a resting bradycardia while she was on the monitor at times however I do not feel this correlates with her symptoms.  Likely incidental and unrelated.  CT scan did not show any acute abnormality.  Low suspicion for an aneurysm stroke at this time.  Symptoms could be related to migraine headache.  She has had these in the past.  Not typical for vertigo but will try course of meclizine to see if this helps.  This time patient appears appropriate for discharge and close outpatient follow-up.        Final Clinical Impression(s) / ED Diagnoses Final diagnoses:  Dizziness  Nonintractable headache, unspecified chronicity pattern, unspecified headache type    Rx / DC Orders ED Discharge Orders          Ordered    meclizine (ANTIVERT) 25 MG tablet  3 times daily PRN        08/12/23 1132              Linwood Dibbles, MD 08/12/23 1135

## 2023-08-12 NOTE — Discharge Instructions (Addendum)
Take the medications to see if that helps with your dizziness.  Follow-up with your primary care doctor to be rechecked.  Return to the ED for worsening symptoms

## 2023-08-12 NOTE — ED Notes (Signed)
Pt arrived in room CAOx4 and ambulating. Pt complains of headache and nausea at this time. Pt has hx of vertigo and states that she is concerned because symptoms have lasted for 3 weeks.

## 2023-08-12 NOTE — ED Triage Notes (Signed)
Pt reports being dizzy for 3 weeks. Pt also endorses nausea/vomiting, along with intermittent headaches, and pain in ears. Pt does endorse decreased sensation on the left side, and hx of left sided aneurysm. Pt also has hx of vertigo.

## 2023-09-01 ENCOUNTER — Ambulatory Visit (INDEPENDENT_AMBULATORY_CARE_PROVIDER_SITE_OTHER): Payer: Medicare HMO | Admitting: Family Medicine

## 2023-09-01 ENCOUNTER — Encounter: Payer: Self-pay | Admitting: Family Medicine

## 2023-09-01 VITALS — BP 112/76 | HR 69 | Temp 98.4°F | Ht 63.5 in | Wt 193.0 lb

## 2023-09-01 DIAGNOSIS — Z0289 Encounter for other administrative examinations: Secondary | ICD-10-CM

## 2023-09-01 DIAGNOSIS — E559 Vitamin D deficiency, unspecified: Secondary | ICD-10-CM | POA: Insufficient documentation

## 2023-09-01 DIAGNOSIS — Z9884 Bariatric surgery status: Secondary | ICD-10-CM

## 2023-09-01 DIAGNOSIS — Z6833 Body mass index (BMI) 33.0-33.9, adult: Secondary | ICD-10-CM | POA: Diagnosis not present

## 2023-09-01 DIAGNOSIS — E66811 Obesity, class 1: Secondary | ICD-10-CM | POA: Diagnosis not present

## 2023-09-01 NOTE — Addendum Note (Signed)
Addended by: Glennis Brink on: 09/01/2023 04:15 PM   Modules accepted: Level of Service

## 2023-09-01 NOTE — Assessment & Plan Note (Signed)
She has had less restriction with 20 lb of weight regain in the past year She would like to get back to 170 lb and is doing gym workouts several times a week She is not drinking with meals and avoids SSBs She is compliant with her supplements  Will be working on 4-5 small meals per day each with lean protein Check annual bariatric labs next visit

## 2023-09-01 NOTE — Progress Notes (Signed)
Office: [phone]  /  Fax: [phone]   Initial Visit  Brittney Sanford was seen in clinic today to evaluate for obesity. She is interested in losing weight to improve overall health and reduce the risk of weight related complications. She presents today to review program treatment options, initial physical assessment, and evaluation.     She was referred by: Specialist  When asked what else they would like to accomplish? She states: Improve existing medical conditions, Improve quality of life, and Improve appearance  Weight history:  she had been overweight since having 3rd child.  Lost from 298 lb to 171 lb in 2022 from VSG with Dr Brittney Sanford.  Regained 20 lb in 1 year with less restriction and increased hunger  When asked how has your weight affected you? She states: Contributed to orthopedic problems or mobility issues  Some associated conditions: Arthritis:back, knees and OSA - not wearing CPAP  Contributing factors: Family history and Nutritional  Weight promoting medications identified: None  Current nutrition plan: None  Current level of physical activity: Walking and Strength training  Current or previous pharmacotherapy: None  Response to medication: Never tried medications   Past medical history includes:   Past Medical History:  Diagnosis Date   Anemia    Anxiety    History of   Asthma    Chronic back pain    COPD (chronic obstructive pulmonary disease) (HCC)    COVID-19    2020   Depression    History of   Fibromyalgia    GERD (gastroesophageal reflux disease)    Headache    MIGRAINES   Hyperlipidemia    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  Peak Weight:298 , Body  Fat%:40.1, Visceral Fat Rating:11, Weight trend over the last 12 months: Unchanged  General:  Alert, oriented and cooperative. Patient is in no acute distress.  Respiratory: Normal respiratory effort, no problems with respiration noted   Gait: able to ambulate independently  Mental Status: Normal mood and affect. Normal behavior. Normal judgment and thought content.   DIAGNOSTIC DATA REVIEWED:  BMET    Component Value Date/Time   NA 140 08/12/2023 0926   K 4.0 08/12/2023 0926   CL 108 08/12/2023 0926   CO2 24 08/12/2023 0912   GLUCOSE 84 08/12/2023 0926   BUN 7 08/12/2023 0926   CREATININE 0.80 08/12/2023 0926   CALCIUM 9.5 08/12/2023 0912   GFRNONAA >60 08/12/2023 0912   GFRAA >60 05/24/2020 1956   No results found for: "HGBA1C" No results found for: "INSULIN" CBC    Component Value Date/Time   WBC 6.6 08/12/2023 0912   RBC 4.51 08/12/2023 0912   HGB 13.6 08/12/2023 0926   HCT 40.0 08/12/2023 0926   PLT 170 08/12/2023 0912   MCV 91.4 08/12/2023 0912   MCH 30.6 08/12/2023 0912   MCHC 33.5 08/12/2023 0912   RDW 14.3 08/12/2023 0912   Iron/TIBC/Ferritin/ %Sat No results found for: "IRON", "TIBC", "FERRITIN", "IRONPCTSAT" Lipid Panel  No results found for: "CHOL", "TRIG", "HDL", "CHOLHDL", "VLDL", "LDLCALC", "LDLDIRECT" Hepatic Function  Panel     Component Value Date/Time   PROT 7.8 08/12/2023 0912   ALBUMIN 3.9 08/12/2023 0912   AST 22 08/12/2023 0912   ALT 15 08/12/2023 0912   ALKPHOS 78 08/12/2023 0912   BILITOT 0.5 08/12/2023 0912   No results found for: "TSH"   Assessment and Plan:   Vitamin D deficiency Assessment & Plan: Last vitamin D No results found for: "25OHVITD2", "25OHVITD3", "VD25OH" She has been taking a vitamin D supplement and a MVI patch + a calcium supplement daily  Check vitamin D level next visit   Class 1 obesity due to excess calories with body mass index (BMI) of 33.0 to 33.9 in adult, unspecified whether serious comorbidity  present  S/P laparoscopic sleeve gastrectomy Assessment & Plan: She has had less restriction with 20 lb of weight regain in the past year She would like to get back to 170 lb and is doing gym workouts several times a week She is not drinking with meals and avoids SSBs She is compliant with her supplements  Will be working on 4-5 small meals per day each with lean protein Check annual bariatric labs next visit         Obesity Treatment / Action Plan:  Patient will work on garnering support from family and friends to begin weight loss journey. Will work on eliminating or reducing the presence of highly palatable, calorie dense foods in the home. Will complete provided nutritional and psychosocial assessment questionnaire before the next appointment. Will be scheduled for indirect calorimetry to determine resting energy expenditure in a fasting state.  This will allow [place] to create a reduced calorie, high-protein meal plan to promote loss of fat mass while preserving muscle mass. Will think about ideas on how to incorporate physical activity into their daily routine. Counseled on the health benefits of losing 5%-15% of total body weight. Was counseled on nutritional approaches to weight loss and benefits of reducing processed foods and consuming plant-based foods and high quality protein as part of nutritional weight management. Was counseled on pharmacotherapy and role as an adjunct in weight management.   Obesity Education Performed Today:  She was weighed on the bioimpedance scale and results were discussed and documented in the synopsis.  We discussed obesity as a disease and the importance of a more detailed evaluation of all the factors contributing to the disease.  We discussed the importance of long term lifestyle changes which include nutrition, exercise and behavioral modifications as well as the importance of customizing this to her specific health and social needs.  We  discussed the benefits of reaching a healthier weight to alleviate the symptoms of existing conditions and reduce the risks of the biomechanical, metabolic and psychological effects of obesity.  Brittney Sanford appears to be in the action stage of change and states they are ready to start intensive lifestyle modifications and behavioral modifications.  30 minutes was spent today on this visit including the above counseling, pre-visit chart review, and post-visit documentation.  Reviewed by clinician on day of visit: allergies, medications, problem list, medical history, surgical history, family history, social history, and previous encounter notes pertinent to obesity diagnosis.    Seymour Bars, D.O. DABFM, DABOM [facility] [phone] [place], [place] [address] [phone]

## 2023-09-01 NOTE — Assessment & Plan Note (Signed)
Last vitamin D No results found for: "25OHVITD2", "25OHVITD3", "VD25OH" She has been taking a vitamin D supplement and a MVI patch + a calcium supplement daily  Check vitamin D level next visit

## 2023-09-10 ENCOUNTER — Other Ambulatory Visit: Payer: Self-pay | Admitting: Orthopaedic Surgery

## 2023-09-10 DIAGNOSIS — M4326 Fusion of spine, lumbar region: Secondary | ICD-10-CM

## 2023-09-28 ENCOUNTER — Ambulatory Visit (INDEPENDENT_AMBULATORY_CARE_PROVIDER_SITE_OTHER): Payer: Medicare HMO | Admitting: Family Medicine

## 2023-09-28 ENCOUNTER — Encounter: Payer: Self-pay | Admitting: Family Medicine

## 2023-09-28 VITALS — BP 124/73 | HR 52 | Temp 97.9°F | Ht 63.5 in | Wt 195.0 lb

## 2023-09-28 DIAGNOSIS — G4733 Obstructive sleep apnea (adult) (pediatric): Secondary | ICD-10-CM | POA: Diagnosis not present

## 2023-09-28 DIAGNOSIS — E7849 Other hyperlipidemia: Secondary | ICD-10-CM

## 2023-09-28 DIAGNOSIS — G8929 Other chronic pain: Secondary | ICD-10-CM | POA: Diagnosis not present

## 2023-09-28 DIAGNOSIS — R0602 Shortness of breath: Secondary | ICD-10-CM | POA: Diagnosis not present

## 2023-09-28 DIAGNOSIS — Z9884 Bariatric surgery status: Secondary | ICD-10-CM

## 2023-09-28 DIAGNOSIS — R5383 Other fatigue: Secondary | ICD-10-CM | POA: Diagnosis not present

## 2023-09-28 DIAGNOSIS — M549 Dorsalgia, unspecified: Secondary | ICD-10-CM

## 2023-09-28 DIAGNOSIS — Z6834 Body mass index (BMI) 34.0-34.9, adult: Secondary | ICD-10-CM

## 2023-09-28 DIAGNOSIS — E669 Obesity, unspecified: Secondary | ICD-10-CM

## 2023-09-28 DIAGNOSIS — Z1331 Encounter for screening for depression: Secondary | ICD-10-CM | POA: Diagnosis not present

## 2023-09-28 DIAGNOSIS — K219 Gastro-esophageal reflux disease without esophagitis: Secondary | ICD-10-CM

## 2023-09-28 NOTE — Patient Instructions (Addendum)
Labs today Will review next visit  Read labels on food and drink for added sugar Avoid products with over 8 g of sugar per serving  Track calorie intake with the [employer] ap  We will review labs and discuss medication needs next visit

## 2023-09-28 NOTE — Progress Notes (Unsigned)
Chief Complaint:   OBESITY Brittney Sanford (MR# [id number]) is a 57 y.o. female who presents for evaluation and treatment of obesity and related comorbidities. Current BMI is Body mass index is 34 kg/m. Brittney Sanford has been struggling with her weight for many years and has been unsuccessful in either losing weight, maintaining weight loss, or reaching her healthy weight goal.  Brittney Sanford is currently in the action stage of change and ready to dedicate time achieving and maintaining a healthier weight. Brittney Sanford is interested in becoming our patient and working on intensive lifestyle modifications including (but not limited to) diet and exercise for weight loss.  Patient is retired and she lives alone.  She has a lot of food cravings and she is a picky eater.  She is status post VSG in February 2022 with [facility] surgery.  She notes less satiety.  Her physical activity is limited due to pain.  Brittney Sanford's habits were reviewed today and are as follows: Her family eats meals together, she thinks her family will eat healthier with her, her desired weight loss is 35 lbs, she started gaining excessive weight in 1998, her heaviest weight ever was 298 pounds, she is a picky eater and doesn't like to eat healthier foods, she has significant food cravings issues, she snacks frequently in the evenings, she skips meals frequently, she is frequently drinking liquids with calories, she frequently makes poor food choices, and she has problems with excessive hunger.  Depression Screen Brittney Sanford's Food and Mood (modified PHQ-9) score was 20.  Subjective:   1. Other fatigue Brittney Sanford admits to daytime somnolence and admits to waking up still tired. Patient has a history of symptoms of daytime fatigue, morning fatigue, and morning headache. Brittney Sanford generally gets 6 hours of sleep per night, and states that she has nightime awakenings and generally restful sleep. Snoring is present. Apneic episodes are  present. Epworth Sleepiness Score is 9.  EKG done on 08/12/2023 showed normal sinus rhythm.  2. SOBOE (shortness of breath on exertion) Brittney Sanford notes increasing shortness of breath with exercising and seems to be worsening over time with weight gain. She notes getting out of breath sooner with activity than she used to. This has not gotten worse recently. Brittney Sanford denies shortness of breath at rest or orthopnea.  3. OSA (obstructive sleep apnea) Patient used to use a CPAP.  She notes thumbsucking interferes with her CPAP.  She notes daytime somnolence unchanged with or without it.  4. Chronic back pain, unspecified back location, unspecified back pain laterality Patient's pain is worsened today.  She usually can walk and do Pilates.  She is on oxycodone 10 mg 3 times daily and meloxicam 7.5 mg twice daily; not daily.  She takes Trulance for constipation.  With radiation of pain.  5. S/P laparoscopic sleeve gastrectomy Patient is status post SVG with Dr. Daphine Deutscher at [facility] surgery.  Her preop weight was 298 pounds and Brittney Sanford weight 171 pounds.  She takes prescription iron once a week, calcium plus D Gummies daily, multivitamin patch, collagen, and biotin.  She has not needed IV iron.  6. Other hyperlipidemia Patient is on rosuvastatin 10 mg nightly.  She denies any change in myalgias.  She has a family history of hyperlipidemia.  7. Gastroesophageal reflux disease, unspecified whether esophagitis present Patient notes her symptoms have worsened following VSG.  She uses omeprazole 40 mg daily.  Assessment/Plan:   1. Other fatigue Brittney Sanford does feel that her weight is causing her energy to  be lower than it should be. Fatigue may be related to obesity, depression or many other causes. Labs will be ordered, and in the meanwhile, Brittney Sanford will focus on self care including making healthy food choices, increasing physical activity and focusing on stress reduction.  - VITAMIN D 25 Hydroxy  (Vit-D Deficiency, Fractures) - Vitamin B12 - Folate - TSH Rfx on Abnormal to Free T4 - Hemoglobin A1c - Insulin, random - Ferritin - Iron and TIBC - Vitamin B1 - Prealbumin  2. SOBOE (shortness of breath on exertion) Brittney Sanford does feel that she gets out of breath more easily that she used to when she exercises. Brittney Sanford's shortness of breath appears to be obesity related and exercise induced. She has agreed to work on weight loss and gradually increase exercise to treat her exercise induced shortness of breath. Will continue to monitor closely.  3. OSA (obstructive sleep apnea) Patient is to look for obstructive sleep apnea improvements with weight loss.  4. Chronic back pain, unspecified back location, unspecified back pain laterality Patient is to do home back stretches as tolerated.  5. S/P laparoscopic sleeve gastrectomy Patient will continue her current supplementations.  Review post bariatric surgery labs.  Patient is to drink water and sugar-free fluids 30 minutes outside of meals.  6. Other hyperlipidemia We will check labs today.  Patient will continue her current statin dose.  - Lipid panel  7. Gastroesophageal reflux disease, unspecified whether esophagitis present Patient is to look for GERD improvements with weight loss and dietary changes.  8. Depression screening Brittney Sanford had a positive depression screening. Depression is commonly associated with obesity and often results in emotional eating behaviors. We will monitor this closely and work on CBT to help improve the non-hunger eating patterns. Referral to Psychology may be required if no improvement is seen as she continues in our clinic.  9. BMI 34.0-34.9,adult  10. Obesity, current BMI 34.0 Brittney Sanford is currently in the action stage of change and her goal is to continue with weight loss efforts. I recommend Brittney Sanford begin the structured treatment plan as follows:  She has agreed to keeping a food journal and  adhering to recommended goals of 1200 calories and 85-100 grams of protein daily.  Journal logging handout was given. 100-calorie snack list and protein handout was given. Patient is to try out pea protein shakes.   Exercise goals: All adults should avoid inactivity. Some physical activity is better than none, and adults who participate in any amount of physical activity gain some health benefits.   Behavioral modification strategies: increasing lean protein intake, increasing vegetables, increasing water intake, decreasing liquid calories, decreasing eating out, no skipping meals, meal planning and cooking strategies, keeping healthy foods in the home, ways to avoid night time snacking, planning for success, and decreasing junk food.  She was informed of the importance of frequent follow-up visits to maximize her success with intensive lifestyle modifications for her multiple health conditions. She was informed we would discuss her lab results at her next visit unless there is a critical issue that needs to be addressed sooner. Brittney Sanford agreed to keep her next visit at the agreed upon time to discuss these results.  Objective:   Blood pressure 124/73, pulse (!) 52, temperature 97.9 F (36.6 C), height 5' 3.5" (1.613 m), weight 195 lb (88.5 kg), SpO2 100%. Body mass index is 34 kg/m.  EKG: Normal sinus rhythm, rate 65 BPM.  Indirect Calorimeter completed today shows a VO2 of 206 and a REE of 1426.  Her calculated basal metabolic rate is 1610 thus her basal metabolic rate is worse than expected.  General: Cooperative, alert, well developed, in no acute distress. HEENT: Conjunctivae and lids unremarkable. Cardiovascular: Regular rhythm.  Lungs: Normal work of breathing. Neurologic: No focal deficits.   Lab Results  Component Value Date   CREATININE 0.80 08/12/2023   BUN 7 08/12/2023   NA 140 08/12/2023   K 4.0 08/12/2023   CL 108 08/12/2023   CO2 24 08/12/2023   Lab Results   Component Value Date   ALT 15 08/12/2023   AST 22 08/12/2023   ALKPHOS 78 08/12/2023   BILITOT 0.5 08/12/2023   No results found for: "HGBA1C" No results found for: "INSULIN" No results found for: "TSH" No results found for: "CHOL", "HDL", "LDLCALC", "LDLDIRECT", "TRIG", "CHOLHDL" Lab Results  Component Value Date   WBC 6.6 08/12/2023   HGB 13.6 08/12/2023   HCT 40.0 08/12/2023   MCV 91.4 08/12/2023   PLT 170 08/12/2023   No results found for: "IRON", "TIBC", "FERRITIN"  Attestation Statements:   Reviewed by clinician on day of visit: allergies, medications, problem list, medical history, surgical history, family history, social history, and previous encounter notes.  Time spent on visit including pre-visit chart review and post-visit charting and care was 40 minutes.   Trude Mcburney, am acting as transcriptionist for Seymour Bars, DO.  I have reviewed the above documentation for accuracy and completeness, and I agree with the above. Seymour Bars, DO

## 2023-10-02 LAB — FOLATE: Folate: 19.3 ng/mL (ref >3.0)

## 2023-10-02 LAB — PREALBUMIN: PREALBUMIN: 24 mg/dL (ref 10–36)

## 2023-10-02 LAB — TSH RFX ON ABNORMAL TO FREE T4: TSH: 0.735 u[IU]/mL (ref 0.450–4.500)

## 2023-10-02 LAB — FERRITIN: Ferritin: 193 ng/mL — ABNORMAL HIGH (ref 15–150)

## 2023-10-02 LAB — LIPID PANEL
Chol/HDL Ratio: 2.7 ratio (ref 0.0–4.4)
Cholesterol, Total: 181 mg/dL (ref 100–199)
HDL: 67 mg/dL (ref >39)
LDL Chol Calc (NIH): 98 mg/dL (ref 0–99)
Triglycerides: 89 mg/dL (ref 0–149)
VLDL Cholesterol Cal: 16 mg/dL (ref 5–40)

## 2023-10-02 LAB — HEMOGLOBIN A1C
Est. average glucose Bld gHb Est-mCnc: 114 mg/dL
Hgb A1c MFr Bld: 5.6 % (ref 4.8–5.6)

## 2023-10-02 LAB — VITAMIN B12: Vitamin B-12: 293 pg/mL (ref 232–1245)

## 2023-10-02 LAB — INSULIN, RANDOM: INSULIN: 4.4 u[IU]/mL (ref 2.6–24.9)

## 2023-10-02 LAB — IRON AND TIBC
Iron Saturation: 25 % (ref 15–55)
Iron: 71 ug/dL (ref 27–159)
Total Iron Binding Capacity: 288 ug/dL (ref 250–450)
UIBC: 217 ug/dL (ref 131–425)

## 2023-10-02 LAB — VITAMIN B1: Thiamine: 93.7 nmol/L (ref 66.5–200.0)

## 2023-10-02 LAB — VITAMIN D 25 HYDROXY (VIT D DEFICIENCY, FRACTURES): Vit D, 25-Hydroxy: 47.9 ng/mL (ref 30.0–100.0)

## 2023-10-02 NOTE — Discharge Instructions (Signed)

## 2023-10-05 ENCOUNTER — Telehealth: Payer: Self-pay

## 2023-10-05 ENCOUNTER — Ambulatory Visit
Admission: RE | Admit: 2023-10-05 | Discharge: 2023-10-05 | Disposition: A | Discharge status: Home / Self Care | Payer: Medicare HMO | Source: Ambulatory Visit | Attending: Orthopaedic Surgery | Admitting: Orthopaedic Surgery

## 2023-10-05 ENCOUNTER — Inpatient Hospital Stay
Admission: RE | Admit: 2023-10-05 | Discharge: 2023-10-05 | Disposition: A | Discharge status: Home / Self Care | Payer: Medicare HMO | Source: Ambulatory Visit | Attending: Orthopaedic Surgery | Admitting: Orthopaedic Surgery

## 2023-10-05 DIAGNOSIS — M4326 Fusion of spine, lumbar region: Secondary | ICD-10-CM

## 2023-10-05 MED ORDER — MEPERIDINE HCL 50 MG/ML IJ SOLN
50.0000 mg | Freq: Once | INTRAMUSCULAR | Status: DC | PRN
Start: 2023-10-05 — End: 2023-10-06

## 2023-10-05 MED ORDER — IOPAMIDOL (ISOVUE-M 200) INJECTION 41%
20.0000 mL | Freq: Once | INTRAMUSCULAR | Status: AC
  Administered 2023-10-05: 20 mL via INTRATHECAL

## 2023-10-05 MED ORDER — DIAZEPAM 5 MG PO TABS
10.0000 mg | ORAL_TABLET | Freq: Once | ORAL | Status: AC
Start: 2023-10-05 — End: 2023-10-05
  Administered 2023-10-05: 10 mg via ORAL

## 2023-10-05 MED ORDER — ONDANSETRON HCL 4 MG/2ML IJ SOLN: 4.0000 mg | Freq: Once | INTRAMUSCULAR | Status: DC | PRN

## 2023-10-11 ENCOUNTER — Telehealth: Payer: Medicare HMO | Admitting: Nurse Practitioner

## 2023-10-11 DIAGNOSIS — G8929 Other chronic pain: Secondary | ICD-10-CM | POA: Diagnosis not present

## 2023-10-11 DIAGNOSIS — M549 Dorsalgia, unspecified: Secondary | ICD-10-CM | POA: Diagnosis not present

## 2023-10-11 MED ORDER — METHOCARBAMOL 500 MG PO TABS: 500.0000 mg | ORAL_TABLET | Freq: Four times a day (QID) | ORAL | 0 refills | Status: DC

## 2023-10-11 NOTE — Progress Notes (Signed)
I have spent 5 minutes in review of e-visit questionnaire, review and updating patient chart, medical decision making and response to patient.  ° °Jerrell Mangel W Secilia Apps, NP ° °  °

## 2023-10-11 NOTE — Progress Notes (Signed)

## 2023-10-13 ENCOUNTER — Other Ambulatory Visit: Payer: Self-pay | Admitting: Family Medicine

## 2023-10-13 ENCOUNTER — Encounter: Payer: Self-pay | Admitting: Family Medicine

## 2023-10-13 ENCOUNTER — Ambulatory Visit (INDEPENDENT_AMBULATORY_CARE_PROVIDER_SITE_OTHER): Payer: Medicare HMO | Admitting: Family Medicine

## 2023-10-13 VITALS — BP 124/80 | HR 63 | Temp 97.7°F | Ht 63.5 in | Wt 198.0 lb

## 2023-10-13 DIAGNOSIS — Z9884 Bariatric surgery status: Secondary | ICD-10-CM

## 2023-10-13 DIAGNOSIS — E66811 Obesity, class 1: Secondary | ICD-10-CM

## 2023-10-13 DIAGNOSIS — R7989 Other specified abnormal findings of blood chemistry: Secondary | ICD-10-CM | POA: Insufficient documentation

## 2023-10-13 DIAGNOSIS — G8929 Other chronic pain: Secondary | ICD-10-CM | POA: Diagnosis not present

## 2023-10-13 DIAGNOSIS — M549 Dorsalgia, unspecified: Secondary | ICD-10-CM | POA: Diagnosis not present

## 2023-10-13 DIAGNOSIS — Z6834 Body mass index (BMI) 34.0-34.9, adult: Secondary | ICD-10-CM

## 2023-10-13 MED ORDER — CYANOCOBALAMIN 500 MCG PO TABS: 500.0000 ug | ORAL_TABLET | Freq: Every day | ORAL | 2 refills | Status: DC

## 2023-10-13 MED ORDER — WEGOVY 0.25 MG/0.5ML ~~LOC~~ SOAJ: 0.2500 mg | SUBCUTANEOUS | 0 refills | Status: DC

## 2023-10-13 NOTE — Assessment & Plan Note (Signed)
Reviewed most recent lab results.  Her vitamin levels do seem appropriate despite borderline low B12 levels.  She is currently on a multivitamin patch daily.  She has struggled with postop regain currently up 27 pounds from her nadir weight of 171 pounds.  Her surgery was in 2022 with Dr. Daphine Deutscher.  She has noticed increased portion sizes at mealtime and increased snacking with hunger and cravings.  Her prealbumin was at goal.  She has made a more valiant effort to get in lean protein with meals but is still lacking adequate vegetable intake.  She has started reducing her intake of high sugar snacks and sugar sweetened beverages.  Continue on a 1200 -calorie/day diet, excluding added exercise to her app.  Aim for 90+ grams of protein daily. Continue multivitamin patch along with prescription folic acid daily, vitamin D 50,000 IU once weekly, calcium and vitamin D Gummies daily.

## 2023-10-13 NOTE — Assessment & Plan Note (Signed)
Lab Results  Component Value Date   VITAMINB12 293 09/28/2023   Status post vertical sleeve gastrectomy on a multivitamin patch.  She denies a history of pernicious anemia, brain fog or paresthesias.  She has low energy levels.  She does consume red meat and avoids eggs.  Begin vitamin B12 500 mcg once daily.  Recheck level in the next 6 months

## 2023-10-13 NOTE — Patient Instructions (Signed)
Begin B12 500 mcg daily  Begin Wegovy 0.25 mg weekly once approved by insurance Check out pen training video online  Aim for 1200 cal/ day (not adding in exercise) Protein goal 90 g/ day  Keep junk food out of the house Read labels on food and drink for sugar Avoid products with over 8 g of sugar per serving

## 2023-10-13 NOTE — Assessment & Plan Note (Signed)
She has a long history of chronic back pain.  She reports a recent diagnosis of spinal stenosis.  She is status post spinal fusion in 2019.  She has an upcoming visit with her spinal doctor.  She has radicular pain and is on chronic opioid therapy.  This has limited her ability to exercise which has contributed to further weight gain.  Keep upcoming visit with her spinal specialist.  Consider the addition of yoga or waterexercises.

## 2023-10-13 NOTE — Progress Notes (Signed)
Office: [phone]  /  Fax: [phone]  WEIGHT SUMMARY AND BIOMETRICS  Starting Date: 09/28/23  Starting Weight: 195lb   Weight Lost Since Last Visit: 0lb   Vitals Temp: 97.7 F (36.5 C) BP: 124/80 Pulse Rate: 63 SpO2: 98 %   Body Composition  Body Fat %: 42.1 % Fat Mass (lbs): 83.4 lbs Muscle Mass (lbs): 109 lbs Total Body Water (lbs): 76.2 lbs Visceral Fat Rating : 11    HPI  Chief Complaint: OBESITY  Brittney Sanford is here to discuss her progress with her obesity treatment plan. She is on the the Category 2 Plan and states she is following her eating plan approximately 60-70 % of the time. She states she is exercising 0 minutes 0 times per week.   Interval History:  Since last office visit she is up 3 lb She has been eating on plan most of the time and also tracking calories at 1200 cal/ day and 90 g of protein She is not skipping meals She is subbing in a protein shake for breakfast some days She has been hungry all day even after meals She is craving sweets and all food  She has previously used semaglutide and Phentermine/ Topiramate  Pharmacotherapy: none  PHYSICAL EXAM:  Blood pressure 124/80, pulse 63, temperature 97.7 F (36.5 C), height 5' 3.5" (1.613 m), weight 198 lb (89.8 kg), SpO2 98%. Body mass index is 34.52 kg/m.  General: She is overweight, cooperative, alert, well developed, and in no acute distress. PSYCH: Has normal mood, affect and thought process.   Lungs: Normal breathing effort, no conversational dyspnea.   ASSESSMENT AND PLAN  TREATMENT PLAN FOR OBESITY:  Recommended Dietary Goals  Brittney Sanford is currently in the action stage of change. As such, her goal is to continue weight management plan. She has agreed to the Category 2 Plan and keeping a food journal and adhering to recommended goals of 1200 calories and 90+ g of  protein.  Behavioral Intervention  We discussed the following Behavioral Modification Strategies today:  increasing lean protein intake to established goals, increasing vegetables, increasing lower glycemic fruits, increasing fiber rich foods, avoiding skipping meals, work on meal planning and preparation, work on tracking and journaling calories using tracking application, keeping healthy foods at home, avoiding temptations and identifying enticing environmental cues, planning for success, and continue to work on maintaining a reduced calorie state, getting the recommended amount of protein, incorporating whole foods, making healthy choices, staying well hydrated and practicing mindfulness when eating..  Additional resources provided today: NA  Recommended Physical Activity Goals  Brittney Sanford has been advised to work up to 150 minutes of moderate intensity aerobic activity a week and strengthening exercises 2-3 times per week for cardiovascular health, weight loss maintenance and preservation of muscle mass.   She has agreed to Increase physical activity in their day and reduce sedentary time (increase NEAT).  Pharmacotherapy changes for the treatment of obesity: begin Wegovy 0.25 mg weekly injection Patient denies a personal or family history of pancreatitis, medullary thyroid carcinoma or multiple endocrine neoplasia type II. Recommend reviewing pen training video online. 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  She denies a history of pernicious anemia, brain fog or paresthesias.  She has low energy  levels.  She does consume red meat and avoids eggs.  Begin vitamin B12 500 mcg once daily.  Recheck level in the next 6 months  Orders: -     Cyanocobalamin; Take 1 tablet (500 mcg total) by mouth daily.  Dispense: 30 tablet;  Refill: 2  Class 1 obesity due to excess calories with body mass index (BMI) of 34.0 to 34.9 in adult, unspecified whether serious comorbidity present -     CBJSEG; Inject 0.25 mg into the skin once a week.  Dispense: 2 mL; Refill: 0  S/P laparoscopic sleeve gastrectomy Assessment & Plan: Reviewed most recent lab results.  Her vitamin levels do seem appropriate despite borderline low B12 levels.  She is currently on a multivitamin patch daily.  She has struggled with postop regain currently up 27 pounds from her nadir weight of 171 pounds.  Her surgery was in 2022 with Dr. Daphine Deutscher.  She has noticed increased portion sizes at mealtime and increased snacking with hunger and cravings.  Her prealbumin was at goal.  She has made a more valiant effort to get in lean protein with meals but is still lacking adequate vegetable intake.  She has started reducing her intake of high sugar snacks and sugar sweetened beverages.  Continue on a 1200 -calorie/day diet, excluding added exercise to her app.  Aim for 90+ grams of protein daily. Continue multivitamin patch along with prescription folic acid daily, vitamin D 50,000 IU once weekly, calcium and vitamin D Gummies daily.   Chronic back pain, unspecified back location, unspecified back pain laterality Assessment & Plan: She has a long history of chronic back pain.  She reports a recent diagnosis of spinal stenosis.  She is status post spinal fusion in 2019.  She has an upcoming visit with her spinal doctor.  She has radicular pain and is on chronic opioid therapy.  This has limited her ability to exercise which has contributed to further weight gain.  Keep upcoming visit with her spinal specialist.  Consider the addition of yoga or waterexercises.       She was informed of the importance of frequent follow up visits to maximize her success with intensive lifestyle modifications for her multiple health conditions.   ATTESTASTION STATEMENTS:  Reviewed  by clinician on day of visit: allergies, medications, problem list, medical history, surgical history, family history, social history, and previous encounter notes pertinent to obesity diagnosis.   I have personally spent 30 minutes total time today in preparation, patient care, nutritional counseling and documentation for this visit, including the following: review of clinical lab tests; review of medical tests/procedures/services.      Glennis Brink, DO DABFM, DABOM [facility] [address] [place], [place] [address] [phone]

## 2023-10-14 ENCOUNTER — Telehealth: Payer: Self-pay | Admitting: *Deleted

## 2023-10-14 NOTE — Telephone Encounter (Signed)
Prior authorization done via cover my meds for patients. Wegovy. Waiting on determination.

## 2023-10-15 NOTE — Telephone Encounter (Signed)
Prior authorization denied for patients Wegovy through her [facility]. Will send another prior authorization through her medicaid.

## 2023-10-27 ENCOUNTER — Telehealth (INDEPENDENT_AMBULATORY_CARE_PROVIDER_SITE_OTHER): Payer: Self-pay | Admitting: Family Medicine

## 2023-10-27 NOTE — Telephone Encounter (Signed)
Patient states that her rx of Reginal Lutes was denied by her [employer]. Pt states that [facility] sent her an appeal form. Pt would like a call back from Dr. Cathey Endow or Chelsea Primus to advise what pt needs to do with her appeal form. Please call at the number on file.

## 2023-11-03 NOTE — Telephone Encounter (Signed)
Prior authorization faxed over along with office notes and previous denial from [place] to [facility] [place] tracks. Waiting on determination.

## 2023-11-04 ENCOUNTER — Ambulatory Visit: Payer: Medicare HMO | Admitting: Family Medicine

## 2023-11-04 ENCOUNTER — Encounter: Payer: Self-pay | Admitting: Family Medicine

## 2023-11-04 VITALS — BP 129/87 | HR 73 | Temp 98.2°F | Ht 63.5 in | Wt 200.0 lb

## 2023-11-04 DIAGNOSIS — E66811 Obesity, class 1: Secondary | ICD-10-CM | POA: Diagnosis not present

## 2023-11-04 DIAGNOSIS — M549 Dorsalgia, unspecified: Secondary | ICD-10-CM

## 2023-11-04 DIAGNOSIS — Z6834 Body mass index (BMI) 34.0-34.9, adult: Secondary | ICD-10-CM

## 2023-11-04 DIAGNOSIS — G8929 Other chronic pain: Secondary | ICD-10-CM | POA: Diagnosis not present

## 2023-11-04 DIAGNOSIS — Z9884 Bariatric surgery status: Secondary | ICD-10-CM

## 2023-11-04 DIAGNOSIS — R7989 Other specified abnormal findings of blood chemistry: Secondary | ICD-10-CM

## 2023-11-04 MED ORDER — CYANOCOBALAMIN 500 MCG PO TABS: 500.0000 ug | ORAL_TABLET | Freq: Every day | ORAL | 2 refills | Status: AC

## 2023-11-04 MED ORDER — WEGOVY 0.25 MG/0.5ML ~~LOC~~ SOAJ: 0.2500 mg | SUBCUTANEOUS | 0 refills | Status: DC

## 2023-11-04 NOTE — Progress Notes (Signed)
Office: [phone]  /  Fax: [phone]  WEIGHT SUMMARY AND BIOMETRICS  Starting Date: 09/28/23  Starting Weight: 195lb   Weight Lost Since Last Visit: 0lb   Vitals Temp: 98.2 F (36.8 C) BP: 129/87 Pulse Rate: 73 SpO2: 98 %   Body Composition  Body Fat %: 43.1 % Fat Mass (lbs): 86.6 lbs Muscle Mass (lbs): 108.4 lbs Total Body Water (lbs): 75.8 lbs Visceral Fat Rating : 12    HPI  Chief Complaint: OBESITY  Brittney Sanford is here to discuss her progress with her obesity treatment plan. She is on the the Category 2 Plan and states she is following her eating plan approximately 60 % of the time. She states she is exercising 0 minutes 0 times per week.   Interval History:  Since last office visit she is up 2 lb She has had a harder time getting in all the food on her plan Denies cravings for sweets She remains on a MVI patch but she didn't pick up her B12 supplement Exercise has been limited due to back pain She is scheduled for back surgery with Dr Noel Gerold 12/10 She has a net weight gain of 5 lb in the past 5 weeks  Pharmacotherapy: none  PHYSICAL EXAM:  Blood pressure 129/87, pulse 73, temperature 98.2 F (36.8 C), height 5' 3.5" (1.613 m), weight 200 lb (90.7 kg), SpO2 98%. Body mass index is 34.87 kg/m.  General: She is overweight, cooperative, alert, well developed, and in no acute distress. PSYCH: Has normal mood, affect and thought process.   Lungs: Normal breathing effort, no conversational dyspnea.   ASSESSMENT AND PLAN  TREATMENT PLAN FOR OBESITY:  Recommended Dietary Goals  Brittney Sanford is currently in the action stage of change. As such, her goal is to continue weight management plan. She has agreed to the Category 2 Plan.  Behavioral Intervention  We discussed the following Behavioral Modification Strategies today: increasing lean protein intake to established goals, increasing fiber rich foods, work on meal planning and preparation, keeping  healthy foods at home, avoiding temptations and identifying enticing environmental cues, continue to work on implementation of reduced calorie nutritional plan, planning for success, and continue to work on maintaining a reduced calorie state, getting the recommended amount of protein, incorporating whole foods, making healthy choices, staying well hydrated and practicing mindfulness when eating..  Additional resources provided today: NA  Recommended Physical Activity Goals  Brittney Sanford has been advised to work up to 150 minutes of moderate intensity aerobic activity a week and strengthening exercises 2-3 times per week for cardiovascular health, weight loss maintenance and preservation of muscle mass.   She has agreed to Think about enjoyable ways to increase daily physical activity and overcoming barriers to exercise and Increase physical activity in their day and reduce sedentary time (increase NEAT).  Pharmacotherapy changes for the treatment of obesity: re- run PA for Wegovy 0.25 mg to start AFTER BACK SURGERY  ASSOCIATED CONDITIONS ADDRESSED TODAY  Low vitamin B12 level Assessment & Plan: Lab Results  Component Value Date   VITAMINB12 293 09/28/2023   She c/o fatigue and has not been taking her B12 supplement  Re-sent RX for vitamin B12 500 mcg to pharmacy for pick up Repeat lab in 6 mos  Orders: -     Cyanocobalamin; Take 1 tablet (500 mcg total) by mouth daily.  Dispense: 30 tablet; Refill: 2  Class 1 obesity due to excess calories with body mass index (BMI) of 34.0 to 34.9 in adult, unspecified whether  serious comorbidity present -     ZOXWRU; Inject 0.25 mg into the skin once a week.  Dispense: 2 mL; Refill: 0  S/P laparoscopic sleeve gastrectomy Assessment & Plan: She is net down 98 lb from VSG in 2022 but up 29 lb from her previous nadir weight Her goal is to get back down to 170 lb where she felt better She is working on prescribed diet, high protein/ 1200 cal/  day Exercise has been limited due to back pain  Continue prescribed diet Plan to start PT post op Continue with MVI patch daily   Chronic back pain, unspecified back location, unspecified back pain laterality Assessment & Plan: Awaiting L3-S1 laminectomy and fusion with Dr Noel Gerold at [facility] on 1/210. Limited exercise due to chronic back pain which will hopefully improve over time post op Wait until AFTER back surgery and eating a regular diet to start on Wegovy 0.25 mg weekly injection       She was informed of the importance of frequent follow up visits to maximize her success with intensive lifestyle modifications for her multiple health conditions.   ATTESTASTION STATEMENTS:  Reviewed by clinician on day of visit: allergies, medications, problem list, medical history, surgical history, family history, social history, and previous encounter notes pertinent to obesity diagnosis.   I have personally spent 30 minutes total time today in preparation, patient care, nutritional counseling and documentation for this visit, including the following: review of clinical lab tests; review of medical tests/procedures/services.      Glennis Brink, DO DABFM, DABOM [facility] [address] [place], [place] [address] [phone]

## 2023-11-04 NOTE — Assessment & Plan Note (Signed)
Lab Results  Component Value Date   VITAMINB12 293 09/28/2023   She c/o fatigue and has not been taking her B12 supplement  Re-sent RX for vitamin B12 500 mcg to pharmacy for pick up Repeat lab in 6 mos

## 2023-11-04 NOTE — Assessment & Plan Note (Signed)
She is net down 98 lb from VSG in 2022 but up 29 lb from her previous nadir weight Her goal is to get back down to 170 lb where she felt better She is working on prescribed diet, high protein/ 1200 cal/ day Exercise has been limited due to back pain  Continue prescribed diet Plan to start PT post op Continue with MVI patch daily

## 2023-11-04 NOTE — Assessment & Plan Note (Signed)
Awaiting L3-S1 laminectomy and fusion with Dr Noel Gerold at [facility] on 1/210. Limited exercise due to chronic back pain which will hopefully improve over time post op Wait until AFTER back surgery and eating a regular diet to start on Wegovy 0.25 mg weekly injection

## 2023-11-05 ENCOUNTER — Ambulatory Visit: Payer: Medicare HMO | Admitting: Family Medicine

## 2023-12-01 ENCOUNTER — Telehealth: Payer: Self-pay | Admitting: Internal Medicine

## 2023-12-01 NOTE — Telephone Encounter (Signed)
 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

## 2023-12-03 NOTE — Telephone Encounter (Signed)
 Patient will call back to schedule appointments. Patient just had surgery

## 2023-12-08 ENCOUNTER — Ambulatory Visit: Payer: Medicare HMO | Admitting: Family Medicine

## 2023-12-28 ENCOUNTER — Encounter: Payer: Self-pay | Admitting: Family Medicine

## 2023-12-28 ENCOUNTER — Ambulatory Visit (INDEPENDENT_AMBULATORY_CARE_PROVIDER_SITE_OTHER): Payer: Medicare HMO | Admitting: Family Medicine

## 2023-12-28 VITALS — BP 111/79 | HR 85 | Temp 98.1°F | Ht 63.5 in | Wt 203.0 lb

## 2023-12-28 DIAGNOSIS — E66812 Obesity, class 2: Secondary | ICD-10-CM

## 2023-12-28 DIAGNOSIS — M549 Dorsalgia, unspecified: Secondary | ICD-10-CM | POA: Diagnosis not present

## 2023-12-28 DIAGNOSIS — G8929 Other chronic pain: Secondary | ICD-10-CM

## 2023-12-28 DIAGNOSIS — Z9884 Bariatric surgery status: Secondary | ICD-10-CM

## 2023-12-28 DIAGNOSIS — Z6835 Body mass index (BMI) 35.0-35.9, adult: Secondary | ICD-10-CM | POA: Diagnosis not present

## 2023-12-28 MED ORDER — QSYMIA 7.5-46 MG PO CP24: ORAL_CAPSULE | ORAL | 0 refills | Status: DC

## 2023-12-28 MED ORDER — QSYMIA 3.75-23 MG PO CP24: ORAL_CAPSULE | ORAL | 0 refills | Status: DC

## 2023-12-28 NOTE — Progress Notes (Signed)
Office: [phone]  /  Fax: [phone]  WEIGHT SUMMARY AND BIOMETRICS  Starting Date: 09/28/23  Starting Weight: 195lb   Weight Lost Since Last Visit: 0lb   Vitals Temp: 98.1 F (36.7 C) BP: 111/79 Pulse Rate: 85 SpO2: 99 %   Body Composition  Body Fat %: 44.4 % Fat Mass (lbs): 90.2 lbs Muscle Mass (lbs): 107 lbs Total Body Water (lbs): 76.6 lbs Visceral Fat Rating : 12     HPI  Chief Complaint: OBESITY  Brittney Sanford is here to discuss her progress with her obesity treatment plan. She is on the the Category 2 Plan and states she is following her eating plan approximately 0 % of the time. She states she is exercising 0 minutes 0 times per week.  Interval History:  Since last office visit she is up 3 pounds This gives her a net weight gain of 8 pounds in the past 3 months of medically supervised weight management She is healing from back surgery with Dr. Noel Gerold on December chance having an L3-S1 laminectomy and fusion She is currently in physical therapy and plans to add in some treadmill walking soon Wegovy was written for at her last visit but denied by insurance She has struggled with weight regain since her vertical sleeve gastrectomy in 2022 with Dr. Daphine Deutscher.  Preop weight 298 pounds and postop nadir weight 171 pounds Weight regain has only worsened her back pain  Pharmacotherapy: None  PHYSICAL EXAM:  Blood pressure 111/79, pulse 85, temperature 98.1 F (36.7 C), height 5' 3.5" (1.613 m), weight 203 lb (92.1 kg), SpO2 99%. Body mass index is 35.4 kg/m.  General: She is overweight, cooperative, alert, well developed, and in no acute distress. PSYCH: Has normal mood, affect and thought process.   Lungs: Normal breathing effort, no conversational dyspnea.   ASSESSMENT AND PLAN  TREATMENT PLAN FOR OBESITY:  Recommended Dietary Goals  Brittney Sanford is currently in the action stage of change. As such, her goal is to continue weight management plan. She has  agreed to the Category 2 Plan and keeping a food journal and adhering to recommended goals of 1200 calories and 85 g of protein.  Behavioral Intervention  We discussed the following Behavioral Modification Strategies today: increasing lean protein intake to established goals, increasing fiber rich foods, increasing water intake , work on meal planning and preparation, work on Counselling psychologist calories using tracking application, keeping healthy foods at home, identifying sources and decreasing liquid calories, work on managing stress, creating time for self-care and relaxation, continue to practice mindfulness when eating, planning for success, and continue to work on maintaining a reduced calorie state, getting the recommended amount of protein, incorporating whole foods, making healthy choices, staying well hydrated and practicing mindfulness when eating..  Additional resources provided today: NA  Recommended Physical Activity Goals  Brittney Sanford has been advised to work up to 150 minutes of moderate intensity aerobic activity a week and strengthening exercises 2-3 times per week for cardiovascular health, weight loss maintenance and preservation of muscle mass.   She has agreed to Start aerobic activity with a goal of 150 minutes a week at moderate intensity.  Start with physical therapy, gradually adding in some slow treadmill walking as tolerated  Pharmacotherapy changes for the treatment of obesity: Wegovy discontinued from med list, never started due to lack of insurance coverage Start Qsymia 3.75/23 mg once daily with breakfast x 14 days then increase to 7.5/46 mg once daily with breakfast PDMP reviewed, informed consent signed  She will use Qsymia in combination with a reduced calorie high-protein low carbohydrate diet along with a plan to increase regular physical activity She understands potential adverse side effects of Qsymia and has done well on it in the past  ASSOCIATED  CONDITIONS ADDRESSED TODAY  Chronic back pain, unspecified back location, unspecified back pain laterality Assessment & Plan: Back pain is starting to improve status post back surgery with Dr. Noel Gerold on December 10.  She has just started physical therapy.  She is on medication regimen including Percocet, Robaxin, scan and Flexeril as listed  She is hoping to see weight reduction to help her overall reduction of back pain We discussed the plan to resume Qsymia which did work well for her in the past given her denial of Wegovy and postop regain post sleeve gastrectomy in 2022   Class 2 severe obesity due to excess calories with serious comorbidity and body mass index (BMI) of 35.0 to 35.9 in adult (HCC) -     Qsymia; 1 capsule po qAM  Dispense: 14 capsule; Refill: 0 -     Qsymia; 1 capsule po qAM  Dispense: 30 capsule; Refill: 0  S/P laparoscopic sleeve gastrectomy Assessment & Plan: She has done well taking a multivitamin and B12 supplement at 500 mcg once daily.  Annual bariatric labs are reviewed and up-to-date.  She has some volume restriction at mealtime her sleeve gastrectomy but has been over consuming snacks and liquid calories.  She has made more of a conscious effort to reduce boredom snacking and liquid calories.  She has room for improvement with regular exercise and will hopefully be able to increase this now that she is healing up from her back surgery.  Reviewed plan of care together on after visit summary Remember to drink fluids outside of mealtime Avoid caloric beverages and carbonation       She was informed of the importance of frequent follow up visits to maximize her success with intensive lifestyle modifications for her multiple health conditions.   ATTESTASTION STATEMENTS:  Reviewed by clinician on day of visit: allergies, medications, problem list, medical history, surgical history, family history, social history, and previous encounter notes pertinent to obesity  diagnosis.   I have personally spent 30 minutes total time today in preparation, patient care, nutritional counseling and documentation for this visit, including the following: review of clinical lab tests; review of medical tests/procedures/services.      Glennis Brink, DO DABFM, DABOM [facility] [address] [place], [place] [address] [phone]

## 2023-12-28 NOTE — Assessment & Plan Note (Signed)
She has done well taking a multivitamin and B12 supplement at 500 mcg once daily.  Annual bariatric labs are reviewed and up-to-date.  She has some volume restriction at mealtime her sleeve gastrectomy but has been over consuming snacks and liquid calories.  She has made more of a conscious effort to reduce boredom snacking and liquid calories.  She has room for improvement with regular exercise and will hopefully be able to increase this now that she is healing up from her back surgery.  Reviewed plan of care together on after visit summary Remember to drink fluids outside of mealtime Avoid caloric beverages and carbonation

## 2023-12-28 NOTE — Patient Instructions (Signed)
Aim for 1300 cal/ day This should include 80+ g of protein daily Avoid foods and drinks with over 8 g of added sugar per serving  Plan to continue with PT, adding in walking as you feel better  Get started on Qsymia 3.75/23 mg each morning x 14 days then increase to 7.5/46 mg daily with or without breakfast  Call if any problems or questions

## 2023-12-28 NOTE — Assessment & Plan Note (Signed)
Back pain is starting to improve status post back surgery with Dr. Noel Gerold on December 10.  She has just started physical therapy.  She is on medication regimen including Percocet, Robaxin, scan and Flexeril as listed  She is hoping to see weight reduction to help her overall reduction of back pain We discussed the plan to resume Qsymia which did work well for her in the past given her denial of Wegovy and postop regain post sleeve gastrectomy in 2022

## 2023-12-30 ENCOUNTER — Telehealth: Payer: Self-pay | Admitting: *Deleted

## 2023-12-30 NOTE — Telephone Encounter (Signed)
Prior authorization done via cover my meds for patients Qsymia. Waiting on determination.

## 2023-12-31 MED ORDER — QSYMIA 3.75-23 MG PO CP24: ORAL_CAPSULE | ORAL | 0 refills | Status: DC

## 2023-12-31 MED ORDER — QSYMIA 7.5-46 MG PO CP24: ORAL_CAPSULE | ORAL | 0 refills | Status: DC

## 2023-12-31 NOTE — Telephone Encounter (Signed)
done

## 2024-01-19 ENCOUNTER — Other Ambulatory Visit: Payer: Self-pay | Admitting: Pulmonary Disease

## 2024-01-27 ENCOUNTER — Ambulatory Visit (INDEPENDENT_AMBULATORY_CARE_PROVIDER_SITE_OTHER): Payer: Medicare HMO | Admitting: Family Medicine

## 2024-01-27 ENCOUNTER — Encounter: Payer: Self-pay | Admitting: Family Medicine

## 2024-01-27 VITALS — BP 108/77 | HR 86 | Temp 98.0°F | Ht 63.5 in | Wt 210.0 lb

## 2024-01-27 DIAGNOSIS — Z6836 Body mass index (BMI) 36.0-36.9, adult: Secondary | ICD-10-CM

## 2024-01-27 DIAGNOSIS — M549 Dorsalgia, unspecified: Secondary | ICD-10-CM

## 2024-01-27 DIAGNOSIS — G8929 Other chronic pain: Secondary | ICD-10-CM | POA: Diagnosis not present

## 2024-01-27 DIAGNOSIS — E66812 Obesity, class 2: Secondary | ICD-10-CM | POA: Diagnosis not present

## 2024-01-27 DIAGNOSIS — R7989 Other specified abnormal findings of blood chemistry: Secondary | ICD-10-CM | POA: Diagnosis not present

## 2024-01-27 DIAGNOSIS — Z9884 Bariatric surgery status: Secondary | ICD-10-CM

## 2024-01-27 MED ORDER — QSYMIA 7.5-46 MG PO CP24: ORAL_CAPSULE | ORAL | 0 refills | Status: DC

## 2024-01-27 NOTE — Progress Notes (Signed)
 Office: [phone]  /  Fax: [phone]  WEIGHT SUMMARY AND BIOMETRICS  Starting Date: 09/28/23  Starting Weight: 195lb   Weight Lost Since Last Visit: 0lb   Vitals Temp: 98 F (36.7 C) BP: 108/77 Pulse Rate: 86 SpO2: 99 %   Body Composition  Body Fat %: 44.5 % Fat Mass (lbs): 93.8 lbs Muscle Mass (lbs): 111 lbs Total Body Water (lbs): 77.2 lbs Visceral Fat Rating : 13     HPI  Chief Complaint: OBESITY  Brittney Sanford is here to discuss her progress with her obesity treatment plan. She is on the the Category 2 Plan and states she is following her eating plan approximately 50 % of the time. She states she is walking and physical therapy.    Interval History:  Since last office visit she is up 7 lb She is up 4 lb of muscle mass and is up 3.6 lb of body fat She started Qsymia 2 weeks ago She just started the 7.5/46 mg dose today and is tolerating well She didn't feel much satiety or changes from 3.75/23 mg dose.  Denies adverse SE Cravings and portion control are doing better She is in PT since her back surgery 12/10 with Dr Noel Gerold She has also added in some outdoor walking  She has a net weight gain of 15 lb in the past 4 mos  Pharmacotherapy: Qsymia 7.5/46 mg qAM  PHYSICAL EXAM:  Blood pressure 108/77, pulse 86, temperature 98 F (36.7 C), height 5' 3.5" (1.613 m), weight 210 lb (95.3 kg), SpO2 99%. Body mass index is 36.62 kg/m.  General: She is overweight, cooperative, alert, well developed, and in no acute distress. PSYCH: Has normal mood, affect and thought process.   Lungs: Normal breathing effort, no conversational dyspnea.   ASSESSMENT AND PLAN  TREATMENT PLAN FOR OBESITY:  Recommended Dietary Goals  Brittney Sanford is currently in the action stage of change. As such, her goal is to continue weight management plan. She has agreed to keeping a food journal and adhering to recommended goals of 1200 calories and 90+ g of  protein.  Behavioral  Intervention  We discussed the following Behavioral Modification Strategies today: increasing lean protein intake to established goals, increasing fiber rich foods, increasing water intake , work on meal planning and preparation, work on tracking and journaling calories using tracking application, keeping healthy foods at home, decreasing eating out or consumption of processed foods, and making healthy choices when eating convenient foods, practice mindfulness eating and understand the difference between hunger signals and cravings, work on managing stress, creating time for self-care and relaxation, avoiding temptations and identifying enticing environmental cues, and continue to work on maintaining a reduced calorie state, getting the recommended amount of protein, incorporating whole foods, making healthy choices, staying well hydrated and practicing mindfulness when eating..  Additional resources provided today: NA  Recommended Physical Activity Goals  [name] has been advised to work up to 150 minutes of moderate intensity aerobic activity a week and strengthening exercises 2-3 times per week for cardiovascular health, weight loss maintenance and preservation of muscle mass.   She has agreed to Increase the intensity, frequency or duration of aerobic exercises   Continue PT Ramp up outdoor walking time slowly  Pharmacotherapy changes for the treatment of obesity: none  ASSOCIATED CONDITIONS ADDRESSED TODAY  Low vitamin B12 level Lab Results  Component Value Date   VITAMINB12 293 09/28/2023   She is at risk for B12 deficiency status post vertical sleeve gastrectomy on a  multivitamin once daily.  She complains of fatigue but denies paresthesias or brain fog.  She has not yet started over-the-counter vitamin B12 500 mcg once daily as directed.  Recommend starting vitamin B12 OTC 500 mcg once daily in addition to her multivitamin daily. Repeat lab in 6 months  Class 2 obesity due to  excess calories with body mass index (BMI) of 36.0 to 36.9 in adult, unspecified whether serious comorbidity present -     Qsymia; 1 capsule po qAM  Dispense: 30 capsule; Refill: 0 Blood pressure and heart rate are within normal limits on Qsymia without adverse side effect, currently getting through [facility] order pharmacy with improved satiety  Benefit outweighs the risk to continue Qsymia at 7.5/46 mg once daily along with a reduced calorie prescribed diet focus on lean protein and fiber with meals.  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 Daphine Deutscher 2022  She has improved sense of satiety with the addition of Qsymia.  Her insurance did not cover use of a GLP-1 receptor agonist.  Continue current plan of care  Chronic back pain, unspecified back location, unspecified back pain laterality Status post L3-S1 laminectomy and fusion with Dr. Noel Gerold 11/10/2023.  Currently in physical therapy 2 days a week with improvements in pain.  She has been able to add in some light walking without difficulty.  Continue follow-up with Dr. Noel Gerold as directed.  Slowly increase walking time once cleared by physical therapy.     She was informed of the importance of frequent follow up visits to maximize her success with intensive lifestyle modifications for her multiple health conditions.   ATTESTASTION STATEMENTS:  Reviewed by clinician on day of visit: allergies, medications, problem list, medical history, surgical history, family history, social history, and previous encounter notes pertinent to obesity diagnosis.   I have personally spent 30 minutes total time today in preparation, patient care, nutritional counseling and documentation for this visit, including the following: review of clinical lab tests; review of medical  tests/procedures/services.      Glennis Brink, DO DABFM, DABOM [facility] [address] [place], [place] [address] [phone]

## 2024-02-23 ENCOUNTER — Ambulatory Visit: Payer: Medicare HMO | Admitting: Internal Medicine

## 2024-02-23 ENCOUNTER — Encounter: Payer: Self-pay | Admitting: Internal Medicine

## 2024-02-23 VITALS — BP 108/62 | HR 86 | Temp 97.8°F | Ht 63.5 in | Wt 219.0 lb

## 2024-02-23 DIAGNOSIS — J849 Interstitial pulmonary disease, unspecified: Secondary | ICD-10-CM

## 2024-02-23 DIAGNOSIS — Z122 Encounter for screening for malignant neoplasm of respiratory organs: Secondary | ICD-10-CM

## 2024-02-23 DIAGNOSIS — R053 Chronic cough: Secondary | ICD-10-CM

## 2024-02-23 DIAGNOSIS — C966 Unifocal Langerhans-cell histiocytosis: Secondary | ICD-10-CM | POA: Diagnosis not present

## 2024-02-23 DIAGNOSIS — J84117 Desquamative interstitial pneumonia: Secondary | ICD-10-CM

## 2024-02-23 DIAGNOSIS — R0789 Other chest pain: Secondary | ICD-10-CM | POA: Diagnosis not present

## 2024-02-23 DIAGNOSIS — F17210 Nicotine dependence, cigarettes, uncomplicated: Secondary | ICD-10-CM | POA: Diagnosis not present

## 2024-02-23 DIAGNOSIS — R0609 Other forms of dyspnea: Secondary | ICD-10-CM

## 2024-02-23 DIAGNOSIS — Z720 Tobacco use: Secondary | ICD-10-CM

## 2024-02-23 DIAGNOSIS — R918 Other nonspecific abnormal finding of lung field: Secondary | ICD-10-CM

## 2024-02-23 NOTE — Patient Instructions (Addendum)
 ICD-10-CM   1. 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 Encounter for smoking cessation counseling  Z71.6     4. Tobacco abuse  Z72.0     5. Atypical chest pain  R07.89     6. DOE (dyspnea on exertion)  R06.09     7. 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  Diagnosis based on age, smoking, features on the CT scan and nondiagnostic bronchoscopy.  This condition typically improves/cures with quitting smoking but will progress with continued smoking. - There is also mild inflammation along with HX and we believe this is also smoking-related  -There is barely this condition in 2019 and is definitely more prominent as of May 2024  - PFT Aug 2024 shows impaired lung function 1/2 to 2/3rd normal. Concern if you do not quit smoking this will get worse  Plan - reserve seriology blood work for future  - right now focus on quitting smoking  - do HRCT in summer 2025     Atypical chest pain DOE (dyspnea on exertion) Chronic cough  -All the symptoms are very likely due to HX and the lung inflammation but we do need to rule out any bony issues that can happen with HX - Currently stable  Plan  -  We need to oOrder bone scan based on 2nd opinion discussion with Dr Dorothey Baseman of radiologist  - but will do this in future - COntinue BREZTRI   Lung cancer screening age > 86  Plan  - capture information in HRCT in summer 2025  Encounter for smoking cessation counseling Tobacco abuse  - unable to quit on own  Plan - - call [facility] quit smoking program  Followup  APP in 3 months after CT chest  Xxxx  Dear [name],   Congratulations for your interest in quitting smoking!  Find a program that suits you best: when you want to quit, how you need support, where you  live, and how you like to learn.    If you're ready to get started TODAY, consider scheduling a visit through [email or web address].  Appointments are available from 8am to 8pm, Monday to Friday.   Most health insurance plans will cover some level of tobacco cessation visits and medications.    Additional Resources: [employer] are also available to help you quit & provide the support you'll need. Many programs are available in both [place] and Spanish and have a long history of successfully helping people get off and stay off tobacco.    Quit Smoking Apps:  quitSTART at [employer].de QuitGuide?at [employer].dk Online education and resources: Smokefree  at [employer].gov Free Telephone Coaching: QuitNow,  Call 1-800-QUIT-NOW ((240)737-8813) or Text- Ready to 231-606-3604 *Quitline [place] has teamed up with Medicaid to offer a free 14 week program    Vaping- Want to Quit? Free 24/7 support. Call [facility]  [place], [place], [place], [place], [place]  [facility]

## 2024-02-23 NOTE — Progress Notes (Signed)
 OV Brittney Sanford 49?56/62  58 year old female current every day smoker  with a history of asthma, COPD. No prior PFTs per patient. Longstanding history of tobacco use. No family history of sarcoidosis. She has a history of rheumatoid arthritis, but is not on any maintenance medications .  At her max was smoking 2 packs/day. She was referred for evaluation of her underlying emphysema COPD asthma. CT imaging of the chest completed during her recent Sanford visit in March 2024  revealed upper lobe predominant cystic changes and interstitial changes with scattered pulmonary nodules.There is no history of family lung cancer. She was seen by Brittney Sanford, who 04/09/2023 who ordered PFT's, Started the patient on Breztri to see if this helps with her symptomatology. Counseled her heavily on smoking cessation , and started her on Welbutrin   Brittney Sanford also consulted with  Dr. Marchelle Sanford. He felt the differential diagnoses are HX, Sarcoid, Respiratory Bronchiolitis -ILD >> He advised Bronch first > if non diagnostic -> consider SLB OR direct SLB based on shared decision making and safety/risk profile.If bronch or TTBx but with caveat 30-50% yield rate. Can the lab do "CD-1a and CD207 in tissue and BAL fluid," which is highly specific for HX.  She underwent Flexible video fiberoptic bronchoscopy with robotic assistance and biopsies on 05/05/2023. Target was LUL, and in addition BAL from RUL was done . Biopsies showed no malignant cells, however specimen demonstrates pulmonary parenchyma with chronic inflammation and interstitial fibrosis.Brittney Sanford suspects suspect this is Brittney Sanford but she is still smoking . 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 There were also atypical spindle cells noted but there were so few cells on the block that features were not sufficient to support a diagnosis of Langerhans cell histiocytosis Brittney Sanford).Recommendation that if clinical concern for Brittney Sanford persists, re-biopsy for more material is  necessary to evaluate the possibility.     Pt. Presents today for follow up bronch. Procedure was done 05/05/2023. She states she has done well after the bronch. No hemoptysis. She did have a sore throat, this has self resolved.She has had continued pain and chest tightness. She did call the office 05/09/2023. Dr. Thora Sanford prescribed her a  prednisone taper, but she has not started it. She states she does have improvement in her chest pain with prednisone, so she has agreed to start this today.  I have sent biopsy results to Dr. Marchelle Sanford to evaluate, in addition to Brittney Sanford. It does confirm fibrosis, but not specific type. There were also atypical spindle cells noted but there were so few cells on the block that features were not sufficient to support a diagnosis of Langerhans cell histiocytosis.  Plan is to review at ILD conference, follow up with Dr. Marchelle Sanford and then determine if surgical lung biopsy should be next step as bronch was not definitive.  She will also start the prednisone taper Dr. Thora Sanford prescribed 05/09/2023.      OV 06/16/2023 -referred by Dr. Elige Radon Sanford to Dr. Marchelle Sanford in the ILD center.  Subjective:  Patient ID: Brittney Sanford, female , DOB: [birth year] , age 42 y.o. , MRN: [id number] , ADDRESS: [address] [place] [address] PCP Brittney Plumber, MD Patient Care Team: Brittney Plumber, MD as PCP - General (Internal Medicine) Brittney Luz, MD (Cardiology)  This Provider for this visit: Treatment Team:  Attending Provider: Kalman Shan, MD    06/16/2023 -   Chief Complaint  Patient presents with  Follow-up    No concerns, a little cough, chest pain,      HPI Brittney Sanford 58 y.o. -presents with her sister and her grandchildren.  The grandchildren are below 24 years of age and are visiting from [place].  None of the family members are independent historians today.  History is gained from talking to the patient and also review of the prior  medical records.  She has been a heavy smoker and for the last 1 years experienced shortness of breath and cough and for the last 2 to 3 months is also having atypical chest pain from the sternal area radiating to the right infra axillary area that comes and goes it is mild but occasionally severe.  It does wake her up.  There is no sweating.  It is also worse with deep inspiration.  She definitely feels she wants treatment for this.  She has been on Breztri inhaler but without much relief.  Did discuss her CT scan of the chest last week at the interstitial lung disease conference.  My early suspicion when Brittney Sanford referred her to me was histocytosis X.  To this end he did bronchoscopy.  It was nondiagnostic but some single cells were seen.  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 Personal visualization of a previous scan from 2019 her findings of upper lobe cyst were very minimal but currently is a lot more extensive.  In addition there is pulmonary infiltrates in the upper and lower lobes.  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  Explained to her that quitting smoking was a #1 and best treatment.  Greater than 70% improving/cure rate if she quit smoking but without quitting smoking she can progress.  Did indicate to her that surgical lung biopsy to make a definitive diagnosis was an option but the current pretest probably is very high.  We took a shared decision making to hold off on surgical lung biopsy and focus on quitting smoking.  Regarding associated DIP/ILD: After the visit I did order autoimmune serology profile but when the CMA went to discuss this with her she did not want to get this done.  We will call her maybe address this at the next visit.  She tried Wellbutrin from the office in the past but  she said it gave her "chest pain".  I do not think the side effect was related to the Wellbutrin probably was coincidental.  However did discuss Chantix.  She does not have bad dreams.  Did indicate the side effects of Chantix.  She currently smoking 2 cigarettes to 3 cigarettes a day.  Did indicate to her she could cut down by 1 cigarette every few days and then in a month stop smoking altogether.  Meanwhile she can start Chantix straightaway and did explain the benefits of Chantix by preventing withdrawal symptoms.  Regarding her symptoms she is most worried about the atypical chest pain.  I want to make sure there is no cardiac issue so we will get an echocardiogram..  In addition I want to make sure there is no bone involvement.  I have written to Brittney Sanford via email to see whether we should get an MRI chest or she could just look at the CT chest sternum again or maybe get a bone scan.    CAse cnferece July 2024   -  Discussion synopsis:  In 2019 lungs had mild emphyasema only without nodularity. In 2024 there is sparring of lung base but has UL predminiant disase with nodularity and small cavitary nodules and ground glass opacities.   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 T his is in variance with official report   OV 02/23/2024  Subjective:  Patient ID: Brittney Sanford, female , DOB: 03/19/1966 , age 43 y.o. , MRN: [id number] , ADDRESS: [address] [place] [address] PCP Brittney Plumber, MD Patient Care Team: Brittney Plumber, MD as PCP - General (Internal Medicine) Brittney Luz, MD (Cardiology)  This Provider for this visit: Treatment Team:  Attending Provider: Kalman Shan, MD    02/23/2024 -   Chief Complaint  Patient presents with   Follow-up    Everything is going ok, occ dry cough, had back surgery Dec 7   #Histiocytosis X associated with DIP #Smoking #Bronchitis symptoms on Breztri #Lung cancer screening  HPI Brittney Sanford 58 y.o. -presents for follow-up.  Since her last visit she had pulmonary function test that shows 50-60% restriction with low DLCO.  She says respiratory wise she is stable.  See symptom score below.  She continues to smoke she is been unable to quit.  Chantix gave her side effects.  We discussed referring to the [place] quit smoking program and she is interested in this.  It is now almost a year since she got a CT scan of the chest.  I did discuss to about lung cancer screening and she is interested in getting a CT scan of the chest but it be a high-resolution given her histiocytosis X and also DIP.  Last visit she declined serology and I also ordered a bone scan because of her Hx but she has not gotten this done.  We will hold off on this at the moment.  Currently just focus on quitting smoking and getting neck CT scan of the chest in a few months and she is open to this idea.  She did have back surgery in December 2024 and she slowly improving.        SYMPTOM SCALE - ILD 06/16/2023 02/23/2024   Current weight    O2 use ra   Shortness of Breath 0 -> 5 scale with 5 being worst (score 6 If unable to do)   At rest 0 0  Simple tasks - showers, clothes change, eating, shaving 4 4  Household (dishes, doing bed, laundry) 4 4  Shopping 4 5  Walking level at own pace 4 3  Walking up Stairs  5  Total (30-36) Dyspnea Score 20 21  How bad is your cough? 4 2  How bad is your fatigue 0 5  How bad is nausea 0 0  How bad is vomiting?  0 0  How bad is diarrhea? 0 0  How bad is anxiety? 0 2  How bad is depression 0 3  Any  chronic pain - if so where and how bad 0 0       PFT     Latest  Ref Rng & Units 07/20/2023    3:32 PM  PFT Results  FVC-Pre L 2.34   FVC-Predicted Pre % 68   FVC-Post L 2.30   FVC-Predicted Post % 67   Pre FEV1/FVC % % 73   Post FEV1/FCV % % 79   FEV1-Pre L 1.70   FEV1-Predicted Pre % 63   FEV1-Post L 1.81   DLCO uncorrected ml/min/mmHg 11.49   DLCO UNC% % 55   DLCO corrected ml/min/mmHg 11.49   DLCO COR %Predicted % 55   DLVA Predicted % 72        LAB RESULTS last 96 hours No results found.       has a past medical history of Anemia, Anxiety, Arthritis, Asthma, Chronic back pain, Chronic back pain, COPD (chronic obstructive pulmonary disease) (HCC), COVID-19, Depression, Fibromyalgia, GERD (gastroesophageal reflux disease), Headache, Hyperlipidemia, Pseudoaneurysm (HCC), Rheumatoid arthritis (HCC), Sleep apnea, and Sleep apnea.   reports that she has been smoking cigarettes. She started smoking about 47 years ago. She has a 141.7 pack-year smoking history. She has never used smokeless tobacco.  Past Surgical History:  Procedure Laterality Date   ABDOMINAL HYSTERECTOMY     APPENDECTOMY     BACK SURGERY     BRONCHIAL BIOPSY  05/05/2023   Procedure: BRONCHIAL BIOPSIES;  Surgeon: Josephine Igo, DO;  Location: [facility];  Service: Thoracic;;   BRONCHIAL BRUSHINGS  05/05/2023   Procedure: BRONCHIAL BRUSHINGS;  Surgeon: Josephine Igo, DO;  Location: [facility];  Service: Thoracic;;   BRONCHIAL NEEDLE ASPIRATION BIOPSY  05/05/2023   Procedure: BRONCHIAL NEEDLE ASPIRATION BIOPSIES;  Surgeon: Josephine Igo, DO;  Location: [facility];  Service: Thoracic;;   BRONCHIAL WASHINGS  05/05/2023   Procedure: BRONCHIAL WASHINGS;  Surgeon: Josephine Igo, DO;  Location: [facility];  Service: Thoracic;;   CHOLECYSTECTOMY     LAPAROSCOPIC GASTRIC SLEEVE RESECTION N/A 01/07/2021   Procedure: LAPAROSCOPIC GASTRIC SLEEVE RESECTION AND HIATAL HERNIA REPAIR;  Surgeon: Luretha Murphy, MD;  Location: [facility];  Service: General;  Laterality: N/A;   OVARIAN CYST REMOVAL     UPPER GI ENDOSCOPY N/A 01/07/2021   Procedure: UPPER GI ENDOSCOPY;  Surgeon: Luretha Murphy, MD;  Location: [facility];  Service: General;  Laterality: N/A;    Allergies  Allergen Reactions   Lithium Shortness Of Breath, Nausea And Vomiting, Nausea Only and Rash    Difficulty breathing   Aspirin Rash    Immunization History  Administered Date(s) Administered   Influenza Split 09/14/2017   Influenza-Unspecified 10/17/2015   Moderna Sars-Covid-2 Vaccination 06/28/2020, 08/11/2020    Family History  Problem Relation Age of Onset   COPD Mother    Emphysema Mother    Alcoholism Mother    Alcoholism Father    Stroke Father    Hyperlipidemia Father    Hypertension Father    Obesity Father      Current Outpatient Medications:    albuterol (PROVENTIL) (2.5 MG/3ML) 0.083% nebulizer solution, Take 3 mLs (2.5 mg total) by nebulization every 6 (six) hours as needed for wheezing or shortness of breath., Disp: 75 mL, Rfl: 12   albuterol (VENTOLIN HFA) 108 (90 Base) MCG/ACT inhaler, Inhale 2 puffs into the lungs every 6 (six) hours as needed for wheezing or shortness of breath., Disp: , Rfl:    BIOTIN PO, Take by mouth., Disp: , Rfl:    Budeson-Glycopyrrol-Formoterol (BREZTRI AEROSPHERE) 160-9-4.8 MCG/ACT AERO, INHALE 2 PUFFS IN THE MORNING AND AT BEDTIME, Disp: 11 g, Rfl: 1  Calcium Carb-Cholecalciferol (CALCIUM 500 + D3 PO), Place 1 patch onto the skin daily. Patch Aid Vitamin D3 + Calcium Patch, Disp: , Rfl:    cyanocobalamin (VITAMIN B12) 500 MCG tablet, Take 1 tablet (500 mcg total) by mouth daily., Disp: 30 tablet, Rfl: 2   cyclobenzaprine (FLEXERIL) 10 MG tablet, Take 10 mg by mouth 3 (three) times daily as needed for muscle spasms., Disp: , Rfl:    ergocalciferol (VITAMIN D2) 1.25 MG (50000 UT) capsule, Take 50,000 Units by mouth every Sunday., Disp: , Rfl:    esomeprazole (NEXIUM) 40 MG  capsule, Take 40 mg by mouth daily., Disp: , Rfl:    estradiol (VIVELLE-DOT) 0.0375 MG/24HR, Place 1 patch onto the skin 2 (two) times a week. Sundays & Thursdays., Disp: , Rfl:    folic acid (FOLVITE) 1 MG tablet, Take 1 mg by mouth in the morning., Disp: , Rfl:    gabapentin (NEURONTIN) 300 MG capsule, Take 300 mg by mouth 3 (three) times daily as needed (nerve pain)., Disp: , Rfl:    meclizine (ANTIVERT) 25 MG tablet, Take 1 tablet (25 mg total) by mouth 3 (three) times daily as needed for dizziness., Disp: 30 tablet, Rfl: 0   meloxicam (MOBIC) 7.5 MG tablet, Take 7.5 mg by mouth 2 (two) times daily as needed (pain.)., Disp: , Rfl:    methocarbamol (ROBAXIN) 500 MG tablet, Take 1 tablet (500 mg total) by mouth 4 (four) times daily., Disp: 60 tablet, Rfl: 0   MULTIPLE VITAMINS-IRON PO, Place 1 patch onto the skin daily. Patch Aid Iron Plus Multivitamin Patch, Disp: , Rfl:    oxyCODONE-acetaminophen (PERCOCET) 10-325 MG tablet, Take 1 tablet by mouth in the morning, at noon, and at bedtime., Disp: , Rfl:    Phentermine-Topiramate (QSYMIA) 7.5-46 MG CP24, 1 capsule po qAM, Disp: 30 capsule, Rfl: 0   rosuvastatin (CRESTOR) 10 MG tablet, Take 10 mg by mouth at bedtime., Disp: , Rfl:    SUMAtriptan (IMITREX) 100 MG tablet, Take 100 mg by mouth every 2 (two) hours as needed for migraine., Disp: , Rfl:    TRULANCE 3 MG TABS, Take 3 mg by mouth in the morning., Disp: , Rfl:       Objective:   Vitals:   02/23/24 1341  BP: 108/62  Pulse: 86  Temp: 97.8 F (36.6 C)  TempSrc: Oral  SpO2: 100%  Weight: 219 lb (99.3 kg)  Height: 5' 3.5" (1.613 m)    Estimated body mass index is 38.19 kg/m as calculated from the following:   Height as of this encounter: 5' 3.5" (1.613 m).   Weight as of this encounter: 219 lb (99.3 kg).  @WEIGHTCHANGE @  [employer]   02/23/24 1341  Weight: 219 lb (99.3 kg)     Physical Exam   General: No distress. obese O2 at rest: no Cane present: no Sitting in  wheel chair: no Frail: no Obese: no Neuro: Alert and Oriented x 3. GCS 15. Speech normal Psych: Pleasant Resp:  Barrel Chest - no.  Wheeze - no, Crackles - no, No overt respiratory distress CVS: Normal heart sounds. Murmurs - no Ext: Stigmata of Connective Tissue Disease - no HEENT: Normal upper airway. PEERL +. 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 ILD (interstitial lung disease) (HCC)  J84.9 CT Chest High Resolution    4. Tobacco abuse  Z72.0 CT Chest High Resolution    5. Multiple lung nodules  R91.8 CT Chest High Resolution    6. 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 Encounter for smoking cessation counseling  Z71.6     4. Tobacco abuse  Z72.0     5. Atypical chest pain  R07.89     6. DOE (dyspnea on exertion)  R06.09     7. 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  Diagnosis based on age, smoking, features on the CT scan and nondiagnostic bronchoscopy.  This condition typically improves/cures with quitting smoking but will progress with continued smoking. - There is also mild inflammation along with HX and we believe this is also smoking-related  -There is barely this condition in 2019 and is definitely more prominent as of May 2024  - PFT Aug 2024 shows impaired lung function 1/2 to 2/3rd normal. Concern if you do not quit smoking this will get worse  Plan - reserve seriology blood work for future  - right now focus on quitting smoking  - do HRCT in summer  2025     Atypical chest pain DOE (dyspnea on exertion) Chronic cough  -All the symptoms are very likely due to HX and the lung inflammation but we do need to rule out any bony issues that can happen with HX - Currently stable  Plan  -  We need to oOrder bone scan based on 2nd opinion discussion with Dr Dorothey Sanford of radiologist  - but will do this in future - COntinue BREZTRI   Lung cancer screening age > 28  Plan  - capture information in HRCT in summer 2025  Encounter for smoking cessation counseling Tobacco abuse  - unable to quit on own  Plan - - call [place] quit smoking program  Followup  APP in 3 months after CT chest  Xxxx  Dear Brittney Sanford,   Congratulations for your interest in quitting smoking!  Find a program that suits you best: when you want to quit, how you need support, where you live, and how you like to learn.    If you're ready to get started TODAY, consider scheduling a visit through [email or web address].  Appointments are available from 8am to 8pm, Monday to Friday.   Most health insurance plans will cover some level of tobacco cessation visits and medications.    Additional Resources: [employer] are also available to help you quit & provide the support you'll need. Many programs are available in both [place] and Spanish and have a long history of successfully helping people get off and stay off tobacco.    Quit Smoking Apps:  quitSTART at [employer].de QuitGuide?at [employer]getPark[employer].dk Online education and resources: Smokefree  at [employer].gov Free Telephone Coaching: QuitNow,  Call 1-800-QUIT-NOW ((859) 667-7590) or Text- Ready to 6628217162 *Quitline [place] has teamed up with Medicaid to offer a free 14 week program    Vaping- Want to Quit? Free 24/7 support. Call 1-800-QUIT-NOW Primary  Care  [place], [place], [place], [place], [place]  [facility]      FOLLOWUP Return in about 3 months (around 05/25/2024) for with any of the APPS after CT.    SIGNATURE    Dr. Kalman Sanford, M.D., F.C.C.P,  Pulmonary and Critical Care Medicine Staff Physician, [facility] Director - Interstitial Lung Disease  Program  Pulmonary Fibrosis [facility] at [facility] [place], [place], [address]  Pager: [phone], If no answer or between  15:00h - 7:00h: call [phone] Telephone: [phone]  2:28 PM 02/23/2024

## 2024-03-02 ENCOUNTER — Ambulatory Visit (INDEPENDENT_AMBULATORY_CARE_PROVIDER_SITE_OTHER): Payer: Medicare HMO | Admitting: Family Medicine

## 2024-03-02 ENCOUNTER — Encounter: Payer: Self-pay | Admitting: Family Medicine

## 2024-03-02 VITALS — BP 134/85 | HR 61 | Temp 97.7°F | Ht 63.5 in | Wt 215.0 lb

## 2024-03-02 DIAGNOSIS — G4733 Obstructive sleep apnea (adult) (pediatric): Secondary | ICD-10-CM | POA: Diagnosis not present

## 2024-03-02 DIAGNOSIS — Z9884 Bariatric surgery status: Secondary | ICD-10-CM

## 2024-03-02 DIAGNOSIS — Z6837 Body mass index (BMI) 37.0-37.9, adult: Secondary | ICD-10-CM

## 2024-03-02 DIAGNOSIS — E66812 Obesity, class 2: Secondary | ICD-10-CM

## 2024-03-02 DIAGNOSIS — G8929 Other chronic pain: Secondary | ICD-10-CM

## 2024-03-02 DIAGNOSIS — M549 Dorsalgia, unspecified: Secondary | ICD-10-CM

## 2024-03-02 MED ORDER — QSYMIA 15-92 MG PO CP24: ORAL_CAPSULE | ORAL | 0 refills | Status: DC

## 2024-03-02 MED ORDER — PHENTERMINE-TOPIRAMATE ER 11.25-69 MG PO CP24: ORAL_CAPSULE | ORAL | 0 refills | Status: DC

## 2024-03-02 NOTE — Progress Notes (Signed)
 Office: [phone]  /  Fax: [phone]  WEIGHT SUMMARY AND BIOMETRICS  Starting Date: 09/28/23  Starting Weight: 195lb   Weight Lost Since Last Visit: 0lb   Vitals Temp: 97.7 F (36.5 C) BP: 134/85 Pulse Rate: 61 SpO2: 98 %   Body Composition  Body Fat %: 45.4 % Fat Mass (lbs): 97.8 lbs Muscle Mass (lbs): 111.4 lbs Total Body Water (lbs): 79.4 lbs Visceral Fat Rating : 13     HPI  Chief Complaint: OBESITY  Ceci is here to discuss her progress with her obesity treatment plan. She is on the the Category 2 Plan and states she is following her eating plan approximately 50-60 % of the time. She states she is exercising 30 minutes 2-3 times per week.  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 Daphine Deutscher with a preop weight of 298 pounds and a nadir weight of 171 pounds She complains of excess hunger and cravings She has been taking Qsymia 7.5/46 mg once daily without improved satiety and without adverse side effects Exercise has been limited due to chronic pain.  She has been doing PT since her back surgery in December She is trying to limit sweets at home  Pharmacotherapy: Qsymia 7.5/46 mg once daily  PHYSICAL EXAM:  Blood pressure 134/85, pulse 61, temperature 97.7 F (36.5 C), height 5' 3.5" (1.613 m), weight 215 lb (97.5 kg), SpO2 98%. Body mass index is 37.49 kg/m.  General: She is overweight, cooperative, alert, well developed, and in no acute distress. PSYCH: Has normal mood, affect and thought process.   Lungs: Normal breathing effort, no conversational dyspnea.   ASSESSMENT AND PLAN  TREATMENT PLAN FOR OBESITY:  Recommended Dietary Goals  Yared is currently in the action stage of change. As such, her goal is to continue  weight management plan. She has agreed to the Category 2 Plan and keeping a food journal and adhering to recommended goals of 1200 and calories and 90 g of protein. Reviewed dietary goals together on after visit summary Behavioral Intervention  We discussed the following Behavioral Modification Strategies today: increasing lean protein intake to established goals, increasing fiber rich foods, increasing water intake , work on tracking and journaling calories using tracking application, keeping healthy foods at home, decreasing eating out or consumption of processed foods, and making healthy choices when eating convenient foods, work on managing stress, creating time for self-care and relaxation, avoiding temptations and identifying enticing environmental cues, planning for success, and continue to work on maintaining a reduced calorie state, getting the recommended amount of protein, incorporating whole foods, making healthy choices, staying well hydrated and practicing mindfulness when eating..  Additional resources provided today: NA  Recommended Physical Activity Goals  Ayano has been advised to work up to 150 minutes of moderate intensity aerobic activity a week and strengthening exercises 2-3 times per week for cardiovascular health, weight loss maintenance and preservation of muscle mass.   She has agreed to Think about enjoyable ways to increase daily physical activity and overcoming barriers to exercise and Increase physical activity in their day and reduce sedentary time (increase NEAT).  Pharmacotherapy changes for the treatment of obesity: Increase Qsymia to 11.25/69 mg once daily with breakfast for 14 days and 15/92 mg once daily with breakfast  ASSOCIATED CONDITIONS ADDRESSED TODAY  OSA (obstructive sleep apnea) She is no longer on CPAP.  She was supposed to repeat visit her sleep medicine doctor at [facility] in December but had to reschedule her appointment.  She has  noted increased snoring since regaining weight following her nadir weight of 171 pounds post sleeve gastrectomy.  She has regained weight over the past 1-1/2 years.  Her sleep has also been of poor quality due to chronic pain.  We discussed potentially using Zepbound if she tests in the moderate to severe OSA range.  Schedule follow-up visit with [facility] to repeat polysomnogram.  Continue active plan for weight reduction.  Class 2 severe obesity due to excess calories with serious comorbidity and body mass index (BMI) of 37.0 to 37.9 in adult [facility]) Given increased hunger and cravings and lack of weight loss on Qsymia dose to, will increase her dose to 3 for 2 weeks and then dose 4.  Cautioned her about abrupt cessation of dose 4 of Qsymia due to risk of seizures.  Her blood pressure and heart rate are within normal limits and labs are reviewed and up-to-date. -     Phentermine-Topiramate; 1 capsule po qAM  Dispense: 14 capsule; Refill: 0 -     Qsymia; 1 capsule po QAM  Dispense: 30 capsule; Refill: 0  S/P laparoscopic sleeve gastrectomy She has done a better job of eating 3 meals a day including lean protein and fiber with meals, adding in a few more vegetables but seems to be over snacking due to excess hunger.  She is trying to avoid excess sugar.  Advise tracking daily caloric intake using a dietary tracking app.  Allow to fresh or frozen fruit servings daily and increase nonstarchy vegetable to at least 2 servings per day.  Continue a bariatric multivitamin daily.  Chronic back pain, unspecified back location, unspecified back pain laterality Improving.  She is managed by Dr. Noel Gerold following back surgery done 12/10.  She has been doing physical therapy and the home back stretches.  She has added some more  walking.     She was informed of the importance of frequent follow up visits to maximize her success with intensive lifestyle modifications for her multiple health  conditions.   ATTESTASTION STATEMENTS:  Reviewed by clinician on day of visit: allergies, medications, problem list, medical history, surgical history, family history, social history, and previous encounter notes pertinent to obesity diagnosis.   I have personally spent 30 minutes total time today in preparation, patient care, nutritional counseling and documentation for this visit, including the following: review of clinical lab tests; review of medical tests/procedures/services.      Glennis Brink, DO DABFM, DABOM [facility] [address] [place], [place] [address] [phone]

## 2024-03-02 NOTE — Patient Instructions (Signed)
 Go up on Qsymia to 11.25/ 69 mg every morning x 14 days then increase to 15/92 mg every morning Call if any problems or questions  Follow up with [facility] for repeat sleep study We will need a hard copy of your results  Continue to track daily intake  Remember: Daily protein goal: 90-110 g of protein daily Add in more veggies 2 fruit serving per day 1200 cal/ day Limit meals out Limit sweets Hydrate well with water between meals Walking/ home exercise

## 2024-03-15 ENCOUNTER — Other Ambulatory Visit: Payer: Self-pay

## 2024-03-15 ENCOUNTER — Telehealth

## 2024-03-15 ENCOUNTER — Telehealth: Payer: Self-pay | Admitting: Pulmonary Disease

## 2024-03-15 ENCOUNTER — Emergency Department (HOSPITAL_COMMUNITY)
Admission: EM | Admit: 2024-03-15 | Discharge: 2024-03-15 | Discharge status: Against Medical Advice | Attending: Emergency Medicine | Admitting: Emergency Medicine

## 2024-03-15 ENCOUNTER — Emergency Department (HOSPITAL_COMMUNITY)

## 2024-03-15 ENCOUNTER — Encounter (HOSPITAL_COMMUNITY): Payer: Self-pay | Admitting: Emergency Medicine

## 2024-03-15 DIAGNOSIS — Z5321 Procedure and treatment not carried out due to patient leaving prior to being seen by health care provider: Secondary | ICD-10-CM | POA: Insufficient documentation

## 2024-03-15 DIAGNOSIS — R072 Precordial pain: Secondary | ICD-10-CM | POA: Diagnosis present

## 2024-03-15 LAB — BASIC METABOLIC PANEL WITH GFR
Anion gap: 4 — ABNORMAL LOW (ref 5–15)
BUN: 15 mg/dL (ref 6–20)
CO2: 25 mmol/L (ref 22–32)
Calcium: 9.7 mg/dL (ref 8.9–10.3)
Chloride: 110 mmol/L (ref 98–111)
Creatinine, Ser: 0.97 mg/dL (ref 0.44–1.00)
GFR, Estimated: >60 mL/min (ref >60)
Glucose, Bld: 96 mg/dL (ref 70–99)
Potassium: 4.2 mmol/L (ref 3.5–5.1)
Sodium: 139 mmol/L (ref 135–145)

## 2024-03-15 LAB — CBC
HCT: 40.8 % (ref 36.0–46.0)
Hemoglobin: 13.6 g/dL (ref 12.0–15.0)
MCH: 30.2 pg (ref 26.0–34.0)
MCHC: 33.3 g/dL (ref 30.0–36.0)
MCV: 90.5 fL (ref 80.0–100.0)
Platelets: 200 10*3/uL (ref 150–400)
RBC: 4.51 MIL/uL (ref 3.87–5.11)
RDW: 14.9 % (ref 11.5–15.5)
WBC: 7.4 10*3/uL (ref 4.0–10.5)
nRBC: 0 % (ref 0.0–0.2)

## 2024-03-15 LAB — TROPONIN I (HIGH SENSITIVITY): Troponin I (High Sensitivity): 5 ng/L (ref <18)

## 2024-03-15 NOTE — ED Notes (Signed)
 Pt requesting her IV be removed so she can go home, discussed risks of leaving/benefits of completing care, pt states she would still like to leave

## 2024-03-15 NOTE — ED Triage Notes (Signed)
 Pt BIB [facility] c/o sharp sternum.chest pain, hx pf "lung disease, LDS", worse with inhalation and ambulation, given 1 SL nitroglycerin en route, v/s en route 118/88 HR 60, NSR, RR 18, O2 99% RA

## 2024-03-17 NOTE — Telephone Encounter (Signed)
 Called and got pt booked for 4/21 at 9:00 A.M.

## 2024-03-17 NOTE — Telephone Encounter (Signed)
 Pls ensure first avail followup based on most recent clinic visit

## 2024-03-18 ENCOUNTER — Other Ambulatory Visit: Payer: Self-pay | Admitting: Internal Medicine

## 2024-03-21 ENCOUNTER — Encounter: Payer: Self-pay | Admitting: Internal Medicine

## 2024-03-21 ENCOUNTER — Ambulatory Visit: Admitting: Internal Medicine

## 2024-03-21 VITALS — BP 118/78 | HR 57

## 2024-03-21 DIAGNOSIS — J849 Interstitial pulmonary disease, unspecified: Secondary | ICD-10-CM | POA: Diagnosis not present

## 2024-03-21 DIAGNOSIS — R0789 Other chest pain: Secondary | ICD-10-CM

## 2024-03-21 DIAGNOSIS — F17210 Nicotine dependence, cigarettes, uncomplicated: Secondary | ICD-10-CM

## 2024-03-21 DIAGNOSIS — J84117 Desquamative interstitial pneumonia: Secondary | ICD-10-CM

## 2024-03-21 DIAGNOSIS — R918 Other nonspecific abnormal finding of lung field: Secondary | ICD-10-CM

## 2024-03-21 DIAGNOSIS — C966 Unifocal Langerhans-cell histiocytosis: Secondary | ICD-10-CM

## 2024-03-21 DIAGNOSIS — Z716 Tobacco abuse counseling: Secondary | ICD-10-CM

## 2024-03-21 DIAGNOSIS — Z122 Encounter for screening for malignant neoplasm of respiratory organs: Secondary | ICD-10-CM

## 2024-03-21 NOTE — Progress Notes (Signed)
 OV saral groce 21?36/73  58 year old female current every day smoker  with a history of asthma, COPD. No prior PFTs per patient. Longstanding history of tobacco use. No family history of sarcoidosis. She has a history of rheumatoid arthritis, but is not on any maintenance medications .  At her max was smoking 2 packs/day. She was referred for evaluation of her underlying emphysema COPD asthma. CT imaging of the chest completed during her recent hospital visit in March 2024  revealed upper lobe predominant cystic changes and interstitial changes with scattered pulmonary nodules.There is no history of family lung cancer. She was seen by Dr. Thelda Sanford, who 04/09/2023 who ordered PFT's, Started the patient on Breztri  to see if this helps with her symptomatology. Counseled her heavily on smoking cessation , and started her on Welbutrin   Dr. Thelda Sanford also consulted with  Dr. Bertrum Sanford. He felt the differential diagnoses are HX, Sarcoid, Respiratory Bronchiolitis -ILD >> He advised Bronch first > if non diagnostic -> consider SLB OR direct SLB based on shared decision making and safety/risk profile.If bronch or TTBx but with caveat 30-50% yield rate. Can the lab do "CD-1a and CD207 in tissue and BAL fluid," which is highly specific for HX.  She underwent Flexible video fiberoptic bronchoscopy with robotic assistance and biopsies on 05/05/2023. Target was LUL, and in addition BAL from RUL was done . Biopsies showed no malignant cells, however specimen demonstrates pulmonary parenchyma with chronic inflammation and interstitial fibrosis.Dr. Thelda Sanford suspects suspect this is [facility] but she is still smoking . 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 There were also atypical spindle cells noted but there were so few cells on the block that features were not sufficient to support a diagnosis of Langerhans cell histiocytosis [facility]).Recommendation that if clinical concern for [facility] persists, re-biopsy for more material is  necessary to evaluate the possibility.     Pt. Presents today for follow up bronch. Procedure was done 05/05/2023. She states she has done well after the bronch. No hemoptysis. She did have a sore throat, this has self resolved.She has had continued pain and chest tightness. She did call the office 05/09/2023. Dr. Jenny Sanford prescribed her a  prednisone  taper, but she has not started it. She states she does have improvement in her chest pain with prednisone , so she has agreed to start this today.  I have sent biopsy results to Dr. Bertrum Sanford to evaluate, in addition to Dr. Thelda Sanford. It does confirm fibrosis, but not specific type. There were also atypical spindle cells noted but there were so few cells on the block that features were not sufficient to support a diagnosis of Langerhans cell histiocytosis.  Plan is to review at ILD conference, follow up with Dr. Bertrum Sanford and then determine if surgical lung biopsy should be next step as bronch was not definitive.  She will also start the prednisone  taper Dr. Jenny Sanford prescribed 05/09/2023.      OV 06/16/2023 -referred by Brittney Sanford to Dr. Bertrum Sanford in the ILD center.  Subjective:  Patient ID: Brittney Sanford, female , DOB: May 07, 1966 , age 40 y.o. , MRN: [id number] , ADDRESS: [address] [place] [address] PCP Brittney Pickup, MD Patient Care Team: Brittney Pickup, MD as PCP - General (Internal Medicine) Brittney Pacer, MD (Cardiology)  This Provider for this visit: Treatment Team:  Attending Provider: Maire Scot, MD    06/16/2023 -   Chief Complaint  Patient presents with  Follow-up    No concerns, a little cough, chest pain,      HPI Brittney Sanford 58 y.o. -presents with her sister and her grandchildren.  The grandchildren are below 82 years of age and are visiting from [place].  None of the family members are independent historians today.  History is gained from talking to the patient and also review of the prior  medical records.  She has been a heavy smoker and for the last 1 years experienced shortness of breath and cough and for the last 2 to 3 months is also having atypical chest pain from the sternal area radiating to the right infra axillary area that comes and goes it is mild but occasionally severe.  It does wake her up.  There is no sweating.  It is also worse with deep inspiration.  She definitely feels she wants treatment for this.  She has been on Breztri  inhaler but without much relief.  Did discuss her CT scan of the chest last week at the interstitial lung disease conference.  My early suspicion when Dr. Thelda Sanford referred her to me was histocytosis X.  To this end he did bronchoscopy.  It was nondiagnostic but some single cells were seen.  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 Personal visualization of a previous scan from 2019 her findings of upper lobe cyst were very minimal but currently is a lot more extensive.  In addition there is pulmonary infiltrates in the upper and lower lobes.  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  Explained to her that quitting smoking was a #1 and best treatment.  Greater than 70% improving/cure rate if she quit smoking but without quitting smoking she can progress.  Did indicate to her that surgical lung biopsy to make a definitive diagnosis was an option but the current pretest probably is very high.  We took a shared decision making to hold off on surgical lung biopsy and focus on quitting smoking.  Regarding associated DIP/ILD: After the visit I did order autoimmune serology profile but when the CMA went to discuss this with her she did not want to get this done.  We will call her maybe address this at the next visit.  She tried Wellbutrin  from the office in the past but  she said it gave her "chest pain".  I do not think the side effect was related to the Wellbutrin  probably was coincidental.  However did discuss Chantix .  She does not have bad dreams.  Did indicate the side effects of Chantix .  She currently smoking 2 cigarettes to 3 cigarettes a day.  Did indicate to her she could cut down by 1 cigarette every few days and then in a month stop smoking altogether.  Meanwhile she can start Chantix  straightaway and did explain the benefits of Chantix  by preventing withdrawal symptoms.  Regarding her symptoms she is most worried about the atypical chest pain.  I want to make sure there is no cardiac issue so we will get an echocardiogram..  In addition I want to make sure there is no bone involvement.  I have written to Brittney Sanford via email to see whether we should get an MRI chest or she could just look at the CT chest sternum again or maybe get a bone scan.    CAse cnferece July 2024   -  Discussion synopsis:  In 2019 lungs had mild emphyasema only without nodularity. In 2024 there is sparring of lung base but has UL predminiant disase with nodularity and small cavitary nodules and ground glass opacities.   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 T his is in variance with official report   OV 02/23/2024  Subjective:  Patient ID: Brittney Sanford, female , DOB: 1966/01/03 , age 53 y.o. , MRN: [id number] , ADDRESS: [address] [place] [address] PCP Brittney Pickup, MD Patient Care Team: Brittney Pickup, MD as PCP - General (Internal Medicine) Brittney Pacer, MD (Cardiology)  This Provider for this visit: Treatment Team:  Attending Provider: Maire Scot, MD    02/23/2024 -   Chief Complaint  Patient presents with   Follow-up    Everything is going ok, occ dry cough, had back surgery Dec 7     HPI Brittney Sanford 58 y.o. -presents for follow-up.  Since her last visit she had pulmonary function test that shows 50-60% restriction with low DLCO.  She says respiratory wise she is stable.  See symptom score below.  She continues to smoke she is been unable to quit.  Chantix  gave her side effects.  We discussed referring to the [place] quit smoking program and she is interested in this.  It is now almost a year since she got a CT scan of the chest.  I did discuss to about lung cancer screening and she is interested in getting a CT scan of the chest but it be a high-resolution given her histiocytosis X and also DIP.  Last visit she declined serology and I also ordered a bone scan because of her Hx but she has not gotten this done.  We will hold off on this at the moment.  Currently just focus on quitting smoking and getting neck CT scan of the chest in a few months and she is open to this idea.  She did have back surgery in December 2024 and she slowly improving.         OV 03/21/2024  Subjective:  Patient ID: Brittney Sanford, female , DOB: November 27, 1966 , age 57 y.o. , MRN: [id number] , ADDRESS: [address] [place] [address] PCP Brittney Pickup, MD Patient Care Team: Brittney Pickup, MD as PCP - General (Internal Medicine) Brittney Pacer, MD (Cardiology)  This Provider for this visit: Treatment Team:  Attending Provider: Maire Scot, MD   #Histiocytosis X associated with DIP #Smoking #Bronchitis symptoms on Breztri  #Lung cancer screening  03/21/2024 -   Chief Complaint  Patient presents with   Follow-up    Increased SOB since 03/14/24. She went to ED with CP 03/15/24 and went home after had neg  cards workup.       HPI Joeline Freer 58 y.o. -she is making an acute visit.  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  She also has atypical chest pain.  She had deferred doing serology testing.  Then on March 14, 2024 she had acute onset of sternal pain.  This resulted in ER visit because the pain persisted March 15, 2024.  At this point in time a week out the pain is gone but she does has a dull ache.  There is reproducible tenderness.  ER visit reviewed.  Chest x-ray is basically clear other than chronic changes.  Basic blood work is normal.  She is frustrated by this.  She continues to smoke she is yet to quit.  She has upcoming CT scan of the chest in June 2025.   Her overall symptom burden remains elevated [see below].    SYMPTOM SCALE - ILD 06/16/2023 02/23/2024  03/21/2024   Current weight     O2 use ra    Shortness of Breath 0 -> 5 scale with 5 being worst (score 6 If unable to do)    At rest 0 0 3  Simple tasks - showers, clothes change, eating, shaving 4 4 5   Household (dishes, doing bed, laundry) 4 4 5   Shopping 4 5 5   Walking level at own pace 4 3 5   Walking up Stairs  5 5  Total (30-36) Dyspnea Score 20 21 28   How bad is your cough? 4 2 1   How bad is your fatigue 0 5 3  How bad is nausea 0 0 2  How bad is vomiting?  0 0 0  How bad is diarrhea? 0 0 0  How bad is anxiety? 0 2 3  How bad is depression 0 3 0  Any chronic pain - if so where and how bad 0 0 0     PFT     Latest Ref Rng & Units 07/20/2023    3:32 PM  PFT Results  FVC-Pre L 2.34   FVC-Predicted Pre % 68   FVC-Post L 2.30   FVC-Predicted Post % 67   Pre FEV1/FVC % % 73   Post FEV1/FCV % % 79   FEV1-Pre L 1.70   FEV1-Predicted Pre % 63   FEV1-Post L 1.81   DLCO uncorrected ml/min/mmHg 11.49   DLCO UNC% % 55   DLCO corrected ml/min/mmHg 11.49   DLCO COR %Predicted % 55   DLVA Predicted % 72        LAB RESULTS last 96 hours No results found.       has a past  medical history of Anemia, Anxiety, Arthritis, Asthma, Chronic back pain, Chronic back pain, COPD (chronic obstructive pulmonary disease) (HCC), COVID-19, Depression, Fibromyalgia, GERD (gastroesophageal reflux disease), Headache, Hyperlipidemia, Pseudoaneurysm (HCC), Rheumatoid arthritis (HCC), Sleep apnea, and Sleep apnea.   reports that she has been smoking cigarettes. She started smoking about 47 years ago. She has a 141.9 pack-year smoking history. She has never used smokeless tobacco.  Past Surgical History:  Procedure Laterality Date   ABDOMINAL HYSTERECTOMY     APPENDECTOMY     BACK SURGERY     BRONCHIAL BIOPSY  05/05/2023   Procedure: BRONCHIAL BIOPSIES;  Surgeon: Prudy Brownie, DO;  Location: [facility];  Service: Thoracic;;   BRONCHIAL BRUSHINGS  05/05/2023   Procedure: BRONCHIAL BRUSHINGS;  Surgeon: Prudy Brownie, DO;  Location: [facility];  Service: Thoracic;;   BRONCHIAL NEEDLE ASPIRATION BIOPSY  05/05/2023   Procedure: BRONCHIAL NEEDLE ASPIRATION BIOPSIES;  Surgeon: Prudy Brownie, DO;  Location: [facility];  Service: Thoracic;;   BRONCHIAL WASHINGS  05/05/2023   Procedure: BRONCHIAL WASHINGS;  Surgeon: Prudy Brownie, DO;  Location: [facility];  Service: Thoracic;;   CHOLECYSTECTOMY     LAPAROSCOPIC GASTRIC SLEEVE  RESECTION N/A 01/07/2021   Procedure: LAPAROSCOPIC GASTRIC SLEEVE RESECTION AND HIATAL HERNIA REPAIR;  Surgeon: Jacolyn Matar, MD;  Location: [facility];  Service: General;  Laterality: N/A;   OVARIAN CYST REMOVAL     UPPER GI ENDOSCOPY N/A 01/07/2021   Procedure: UPPER GI ENDOSCOPY;  Surgeon: Jacolyn Matar, MD;  Location: [facility];  Service: General;  Laterality: N/A;    Allergies  Allergen Reactions   Lithium Shortness Of Breath, Nausea And Vomiting, Nausea Only and Rash    Difficulty breathing   Aspirin Rash    Immunization History  Administered Date(s) Administered   Influenza Split 09/14/2017   Influenza-Unspecified 10/17/2015   Moderna  Sars-Covid-2 Vaccination 06/28/2020, 08/11/2020    Family History  Problem Relation Age of Onset   COPD Mother    Emphysema Mother    Alcoholism Mother    Alcoholism Father    Stroke Father    Hyperlipidemia Father    Hypertension Father    Obesity Father      Current Outpatient Medications:    albuterol  (PROVENTIL ) (2.5 MG/3ML) 0.083% nebulizer solution, Take 3 mLs (2.5 mg total) by nebulization every 6 (six) hours as needed for wheezing or shortness of breath., Disp: 75 mL, Rfl: 12   albuterol  (VENTOLIN  HFA) 108 (90 Base) MCG/ACT inhaler, Inhale 2 puffs into the lungs every 6 (six) hours as needed for wheezing or shortness of breath., Disp: , Rfl:    BIOTIN PO, Take by mouth., Disp: , Rfl:    budeson-glycopyrrolate-formoterol (BREZTRI  AEROSPHERE) 160-9-4.8 MCG/ACT AERO inhaler, INHALE 2 PUFFS INTO LUNGS IN THE MORNING AND AT BEDTIME, Disp: 11 g, Rfl: 5   Calcium Carb-Cholecalciferol (CALCIUM 500 + D3 PO), Place 1 patch onto the skin daily. Patch Aid Vitamin D3 + Calcium Patch, Disp: , Rfl:    cyanocobalamin  (VITAMIN B12) 500 MCG tablet, Take 1 tablet (500 mcg total) by mouth daily., Disp: 30 tablet, Rfl: 2   cyclobenzaprine  (FLEXERIL ) 10 MG tablet, Take 10 mg by mouth 3 (three) times daily as needed for muscle spasms., Disp: , Rfl:    ergocalciferol  (VITAMIN D2) 1.25 MG (50000 UT) capsule, Take 50,000 Units by mouth every Sunday., Disp: , Rfl:    esomeprazole (NEXIUM) 40 MG capsule, Take 40 mg by mouth daily., Disp: , Rfl:    estradiol (VIVELLE-DOT) 0.0375 MG/24HR, Place 1 patch onto the skin 2 (two) times a week. Sundays & Thursdays., Disp: , Rfl:    folic acid (FOLVITE) 1 MG tablet, Take 1 mg by mouth in the morning., Disp: , Rfl:    gabapentin  (NEURONTIN ) 300 MG capsule, Take 300 mg by mouth 3 (three) times daily as needed (nerve pain)., Disp: , Rfl:    meclizine  (ANTIVERT ) 25 MG tablet, Take 1 tablet (25 mg total) by mouth 3 (three) times daily as needed for dizziness., Disp: 30  tablet, Rfl: 0   meloxicam (MOBIC) 7.5 MG tablet, Take 7.5 mg by mouth 2 (two) times daily as needed (pain.)., Disp: , Rfl:    MULTIPLE VITAMINS-IRON PO, Place 1 patch onto the skin daily. Patch Aid Iron Plus Multivitamin Patch, Disp: , Rfl:    oxyCODONE -acetaminophen  (PERCOCET) 10-325 MG tablet, Take 1 tablet by mouth in the morning, at noon, and at bedtime., Disp: , Rfl:    Phentermine -Topiramate  (QSYMIA ) 15-92 MG CP24, 1 capsule po QAM, Disp: 30 capsule, Rfl: 0   rosuvastatin (CRESTOR) 10 MG tablet, Take 10 mg by mouth at bedtime., Disp: , Rfl:    TRULANCE 3 MG TABS, Take 3  mg by mouth in the morning., Disp: , Rfl:       Objective:   Vitals:   03/21/24 0901 03/21/24 0902  BP:  118/78  Pulse: (!) 57   SpO2: 99%     Estimated body mass index is 37.49 kg/m as calculated from the following:   Height as of 03/15/24: 5' 3.5" (1.613 m).   Weight as of 03/15/24: 215 lb (97.5 kg).  @WEIGHTCHANGE @  There were no vitals filed for this visit.   Physical Exam   General: No distress. Flat affect O2 at rest: no Cane present: no Sitting in wheel chair: no Frail: no Obese: no Neuro: Alert and Oriented x 3. GCS 15. Speech normal Psych: Pleasant Resp:  Barrel Chest - no.  Wheeze - no, Crackles - no, No overt respiratory distress CHST - rREPORODUCLBE TENDERNESS + CVS: Normal heart sounds. Murmurs - no Ext: Stigmata of Connective Tissue Disease - no HEENT: Normal upper airway. PEERL +. No post nasal drip        Assessment:       ICD-10-CM   1. Atypical chest pain  R07.89 Antinuclear Antib (ANA)    QuantiFERON-TB Gold Plus    Sed Rate (ESR)    Rheumatoid factor    Cyclic citrul peptide antibody, IgG    Sjogrens syndrome-A extractable nuclear antibody    Sjogrens syndrome-B extractable nuclear antibody    Pulmonary function test    2. 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 ILD (interstitial lung disease) (HCC)  J84.9 Antinuclear Antib (ANA)    QuantiFERON-TB Gold Plus    Sed Rate (ESR)    Rheumatoid factor    Cyclic citrul peptide antibody, IgG    Sjogrens syndrome-A extractable nuclear antibody    Sjogrens syndrome-B extractable nuclear antibody    Pulmonary function test    5. Multiple lung nodules  R91.8 Antinuclear Antib (ANA)    QuantiFERON-TB Gold Plus    Sed Rate (ESR)    Rheumatoid factor    Cyclic citrul peptide antibody, IgG    Sjogrens syndrome-A extractable nuclear antibody    Sjogrens syndrome-B extractable nuclear antibody    Pulmonary function test    6. Screening for lung cancer  Z12.2 Antinuclear Antib (ANA)    QuantiFERON-TB Gold Plus    Sed Rate (ESR)    Rheumatoid factor    Cyclic citrul peptide antibody, IgG    Sjogrens syndrome-A extractable nuclear antibody    Sjogrens syndrome-B extractable nuclear antibody    Pulmonary function test    7. Encounter for smoking cessation counseling  Z71.6 Antinuclear Antib (ANA)    QuantiFERON-TB Gold Plus    Sed Rate (ESR)    Rheumatoid factor    Cyclic citrul peptide antibody, IgG    Sjogrens syndrome-A extractable nuclear antibody    Sjogrens syndrome-B extractable nuclear antibody  Pulmonary function test         Plan:     Patient Instructions     ICD-10-CM   1. 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 Encounter  for smoking cessation counseling  Z71.6     4. Tobacco abuse  Z72.0     5. Atypical chest pain  R07.89     6. DOE (dyspnea on exertion)  R06.09     7. 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  Diagnosis based on age, smoking, features on the CT scan and nondiagnostic bronchoscopy.  This condition typically improves/cures with quitting smoking but will progress with continued smoking. - There is also mild inflammation along with HX and we believe this is also smoking-related  -There is barely this condition in 2019 and is definitely more prominent as of May 2024  - PFT Aug 2024 shows impaired lung function 1/2 to 2/3rd normal. Concern if you do not quit smoking this will get worse  Plan  - do HRCT in summer 2025 -schedule May 03, 2024 - Do spirometry and DLCO June 2025    Atypical chest pain with reproducible tenderness  -This resulted in ER visit in April 2025  Plan  - I have emailed the radiologist to see if he should get a bone scan - Check blood ESR, ANA, rheumatoid factor, CCP, double-stranded DNA, SSA and SSB to see if there is any autoimmune cause for her bony pain.  Lung cancer screening age > 24  Plan  - capture information in HRCT in summer 2025 -June 2025  Encounter for smoking cessation counseling Tobacco abuse  - unable to quit on own  Plan - - call [place] quit smoking program as discussed in the past  Followup -June 2025 with Dr. Bertrum Sanford after CT scan of the chest   FOLLOWUP No follow-ups on file.    SIGNATURE    Dr. Maire Sanford, M.D., F.C.C.P,  Pulmonary and Critical Care Medicine Staff Physician, [facility] Center Director - Interstitial Lung Disease  Program  Pulmonary Fibrosis [facility] at [facility] [place], [place], [address]  Pager: [phone], If no answer or  between  15:00h - 7:00h: call [phone] Telephone: [phone]  9:33 AM 03/21/2024

## 2024-03-21 NOTE — Patient Instructions (Addendum)
 ICD-10-CM   1. 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 Encounter for smoking cessation counseling  Z71.6     4. Tobacco abuse  Z72.0     5. Atypical chest pain  R07.89     6. DOE (dyspnea on exertion)  R06.09     7. 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  Diagnosis based on age, smoking, features on the CT scan and nondiagnostic bronchoscopy.  This condition typically improves/cures with quitting smoking but will progress with continued smoking. - There is also mild inflammation along with HX and we believe this is also smoking-related  -There is barely this condition in 2019 and is definitely more prominent as of May 2024  - PFT Aug 2024 shows impaired lung function 1/2 to 2/3rd normal. Concern if you do not quit smoking this will get worse  Plan  - do HRCT in summer 2025 -schedule May 03, 2024 - Do spirometry and DLCO June 2025    Atypical chest pain with reproducible tenderness  -This resulted in ER visit in April 2025  Plan  - I have emailed the radiologist to see if he should get a bone scan - Check blood ESR, ANA, rheumatoid factor, CCP, double-stranded DNA, SSA and SSB to see if there is any autoimmune cause for her bony pain.  Lung cancer screening age > 77  Plan  - capture information in HRCT in summer 2025 -June 2025  Encounter for smoking cessation counseling Tobacco abuse  - unable to quit on own  Plan - - call [place] quit smoking program as discussed in the past  Followup -June 2025 with Dr. Bertrum Brodie after CT scan of the chest

## 2024-03-31 ENCOUNTER — Encounter: Payer: Self-pay | Admitting: Family Medicine

## 2024-03-31 ENCOUNTER — Ambulatory Visit: Admitting: Family Medicine

## 2024-03-31 VITALS — BP 117/80 | HR 80 | Temp 98.0°F | Ht 63.5 in | Wt 213.0 lb

## 2024-03-31 DIAGNOSIS — M549 Dorsalgia, unspecified: Secondary | ICD-10-CM | POA: Diagnosis not present

## 2024-03-31 DIAGNOSIS — E66812 Obesity, class 2: Secondary | ICD-10-CM

## 2024-03-31 DIAGNOSIS — G8929 Other chronic pain: Secondary | ICD-10-CM | POA: Diagnosis not present

## 2024-03-31 DIAGNOSIS — Z6837 Body mass index (BMI) 37.0-37.9, adult: Secondary | ICD-10-CM | POA: Diagnosis not present

## 2024-03-31 DIAGNOSIS — G4733 Obstructive sleep apnea (adult) (pediatric): Secondary | ICD-10-CM

## 2024-03-31 DIAGNOSIS — Z9884 Bariatric surgery status: Secondary | ICD-10-CM

## 2024-03-31 MED ORDER — QSYMIA 15-92 MG PO CP24: ORAL_CAPSULE | ORAL | 1 refills | Status: AC

## 2024-03-31 NOTE — Progress Notes (Signed)
 Office: [phone]  /  Fax: [phone]  WEIGHT SUMMARY AND BIOMETRICS  Starting Date: 09/28/23  Starting Weight: 195lb   Weight Lost Since Last Visit: 2lb   Vitals Temp: 98 F (36.7 C) BP: 117/80 Pulse Rate: 80 SpO2: 96 %   Body Composition  Body Fat %: 43.9 % Fat Mass (lbs): 93.8 lbs Muscle Mass (lbs): 114 lbs Total Body Water (lbs): 78 lbs Visceral Fat Rating : 13    HPI  Chief Complaint: OBESITY  Brittney Sanford is here to discuss her progress with her obesity treatment plan. She is on the the Category 2 Plan and states she is following her eating plan approximately 50 % of the time. She states she is exercising 30 minutes 2 times per week.  Interval History:  Since last office visit she is down 2 lb She did go up on Qsymia  to dose 4 She is tolerating this well without unwanted SE This gives hr a net weight gain of 18 lb in 6 mos She has some dry mouth, drinking more water She has improved appetite  with Qsymia  Food craving have improved She had a sleep study at [facility], waiting for results She has more hip than back pain now that hurts with walking She is being treated for post op regain s/p VSG with Gaylyn Keas with a nadir weight of 171 lb She will be starting an outdoor boot camp She has access to a pool this summer Her mood has improved She is eating out less  Pharmacotherapy: Qsymia  15/92 mg qAM  PHYSICAL EXAM:  Blood pressure 117/80, pulse 80, temperature 98 F (36.7 C), height 5' 3.5" (1.613 m), weight 213 lb (96.6 kg), SpO2 96%. Body mass index is 37.14 kg/m.  General: She is overweight, cooperative, alert, well developed, and in no acute distress. PSYCH: Has normal mood, affect and thought process.   Lungs: Normal breathing effort, no conversational dyspnea.  ASSESSMENT AND PLAN  TREATMENT PLAN FOR OBESITY:  Recommended Dietary Goals  Brittney Sanford is currently in the action stage of change. As such, her goal is to continue weight  management plan. She has agreed to the Category 2 Plan and keeping a food journal and adhering to recommended goals of 1200 calories and 85 protein.  Behavioral Intervention  We discussed the following Behavioral Modification Strategies today: increasing lean protein intake to established goals, increasing fiber rich foods, increasing water intake , work on meal planning and preparation, work on tracking and journaling calories using tracking application, keeping healthy foods at home, identifying sources and decreasing liquid calories, decreasing eating out or consumption of processed foods, and making healthy choices when eating convenient foods, practice mindfulness eating and understand the difference between hunger signals and cravings, avoiding temptations and identifying enticing environmental cues, and continue to work on maintaining a reduced calorie state, getting the recommended amount of protein, incorporating whole foods, making healthy choices, staying well hydrated and practicing mindfulness when eating..  Additional resources provided today: NA  Recommended Physical Activity Goals  Brittney Sanford has been advised to work up to 150 minutes of moderate intensity aerobic activity a week and strengthening exercises 2-3 times per week for cardiovascular health, weight loss maintenance and preservation of muscle mass.   She has agreed to Increase the intensity, frequency or duration of strengthening exercises  and Increase the intensity, frequency or duration of aerobic exercises   Recommend water exercise 2 days/ wk, walking and resistance training with a total exercise goal of 5 days/ wk  Pharmacotherapy  changes for the treatment of obesity: none  ASSOCIATED CONDITIONS ADDRESSED TODAY  OSA (obstructive sleep apnea) She has been back to [facility] for untreated sleep apnea F/u with results Continue active plan for weight reduction  Class 2 severe obesity due to excess  calories with serious comorbidity and body mass index (BMI) of 37.0 to 37.9 in adult (HCC) -     Qsymia ; 1 capsule po QAM  Dispense: 30 capsule; Refill: 1 She is doing well on Qsymia , ramped up to 15/92 mg qAM BP/ HR are WNL  S/P laparoscopic sleeve gastrectomy Continue small portions at mealtime, with lean protein and fiber at meals and snacks Continue to limit meals out, excess snacks and junk food Add in regular cardio and resistance training Continue a bariatric MVI daily  Chronic back pain, unspecified back location, unspecified back pain laterality Stable She did stop PT on her own, not improved F/u with ortho     She was informed of the importance of frequent follow up visits to maximize her success with intensive lifestyle modifications for her multiple health conditions.   ATTESTASTION STATEMENTS:  Reviewed by clinician on day of visit: allergies, medications, problem list, medical history, surgical history, family history, social history, and previous encounter notes pertinent to obesity diagnosis.   I have personally spent 30 minutes total time today in preparation, patient care, nutritional counseling and education,  and documentation for this visit, including the following: review of most recent clinical lab tests, prescribing medications/ refilling medications, reviewing medical assistant documentation, review and interpretation of bioimpedence results.     Micky Albee, D.O. DABFM, DABOM [facility] [address] [place], [place] [address] [phone]

## 2024-04-11 DIAGNOSIS — G4733 Obstructive sleep apnea (adult) (pediatric): Secondary | ICD-10-CM | POA: Insufficient documentation

## 2024-05-02 ENCOUNTER — Ambulatory Visit (INDEPENDENT_AMBULATORY_CARE_PROVIDER_SITE_OTHER): Admitting: Family Medicine

## 2024-05-02 ENCOUNTER — Encounter: Payer: Self-pay | Admitting: Family Medicine

## 2024-05-02 VITALS — BP 121/83 | HR 63 | Temp 97.9°F | Ht 63.5 in | Wt 217.0 lb

## 2024-05-02 DIAGNOSIS — G8929 Other chronic pain: Secondary | ICD-10-CM

## 2024-05-02 DIAGNOSIS — Z6837 Body mass index (BMI) 37.0-37.9, adult: Secondary | ICD-10-CM | POA: Diagnosis not present

## 2024-05-02 DIAGNOSIS — E66812 Obesity, class 2: Secondary | ICD-10-CM

## 2024-05-02 DIAGNOSIS — Z9884 Bariatric surgery status: Secondary | ICD-10-CM

## 2024-05-02 DIAGNOSIS — G4733 Obstructive sleep apnea (adult) (pediatric): Secondary | ICD-10-CM | POA: Diagnosis not present

## 2024-05-02 DIAGNOSIS — Z9189 Other specified personal risk factors, not elsewhere classified: Secondary | ICD-10-CM

## 2024-05-02 DIAGNOSIS — R4589 Other symptoms and signs involving emotional state: Secondary | ICD-10-CM | POA: Diagnosis not present

## 2024-05-02 DIAGNOSIS — Z7289 Other problems related to lifestyle: Secondary | ICD-10-CM

## 2024-05-02 DIAGNOSIS — M549 Dorsalgia, unspecified: Secondary | ICD-10-CM | POA: Diagnosis not present

## 2024-05-02 DIAGNOSIS — Z87898 Personal history of other specified conditions: Secondary | ICD-10-CM

## 2024-05-02 NOTE — Progress Notes (Signed)
 Office: [phone]  /  Fax: [phone]  WEIGHT SUMMARY AND BIOMETRICS  Starting Date: 09/28/23  Starting Weight: 195lb   Weight Lost Since Last Visit: 0lb   Vitals Temp: 97.9 F (36.6 C) BP: 121/83 Pulse Rate: 63 SpO2: 100 %   Body Composition  Body Fat %: 45.6 % Fat Mass (lbs): 99.2 lbs Muscle Mass (lbs): 112.2 lbs Total Body Water (lbs): 81 lbs Visceral Fat Rating : 14   HPI  Chief Complaint: OBESITY  Brittney Sanford is here to discuss her progress with her obesity treatment plan. She is on the the Category 2 Plan and states she is following her eating plan approximately 95 % of the time. She states she is exercising 0 minutes 0 times per week.  Interval History:  Since last office visit she is up 4 lb This gives her a net weight GAIN of 22 lb in 7 mos of medically supervised weight management She has been taking Qsymia  15/92 mg qAM Barriers to success include depressed mood, lack of support and chronic pain She has not been tracking her calories consistently She is still skipping meals She plans to restart water exercise but hasn't yet She is s/p VSG with Dr Adin Aguas in 2022, up 46 lb from her nadir weight   Pharmacotherapy: Qsymia  15/92 mg qAM  PHYSICAL EXAM:  Blood pressure 121/83, pulse 63, temperature 97.9 F (36.6 C), height 5' 3.5" (1.613 m), weight 217 lb (98.4 kg), SpO2 100%. Body mass index is 37.84 kg/m.  General: She is overweight, cooperative, alert, well developed, and in no acute distress. PSYCH: Has normal mood, affect and thought process.   Lungs: Normal breathing effort, no conversational dyspnea.  ASSESSMENT AND PLAN  TREATMENT PLAN FOR OBESITY:  Recommended Dietary Goals  Avanna is currently in the action stage of change. As such, her goal is to continue weight management plan. She has agreed to keeping a food journal and adhering to recommended goals of 1200 calories and 90-110 g of  protein and following a lower carbohydrate,  vegetable and lean protein rich diet plan. Referral to RD made Reviewed goals on AVS with patient  Behavioral Intervention  We discussed the following Behavioral Modification Strategies today: increasing lean protein intake to established goals, increasing fiber rich foods, increasing water intake , work on meal planning and preparation, work on tracking and journaling calories using tracking application, keeping healthy foods at home, work on managing stress, creating time for self-care and relaxation, avoiding temptations and identifying enticing environmental cues, continue to work on implementation of reduced calorie nutritional plan, and continue to work on maintaining a reduced calorie state, getting the recommended amount of protein, incorporating whole foods, making healthy choices, staying well hydrated and practicing mindfulness when eating..  Additional resources provided today: NA  Recommended Physical Activity Goals  [name] has been advised to work up to 150 minutes of moderate intensity aerobic activity a week and strengthening exercises 2-3 times per week for cardiovascular health, weight loss maintenance and preservation of muscle mass.   She has agreed to Think about enjoyable ways to increase daily physical activity and overcoming barriers to exercise, Increase physical activity in their day and reduce sedentary time (increase NEAT)., and Start aerobic activity with a goal of 150 minutes a week at moderate intensity.  Referral to PREP program made Resume water exercise with a goal for 2 days/ wk  Pharmacotherapy changes for the treatment of obesity: taper OFF Qsymia  - take one capsule every other day until RX  runs out then stop (( has 6 capsules remaining ))  ASSOCIATED CONDITIONS ADDRESSED TODAY  Depressed mood Depression and lack of support have been factors into her weight gain over the past 7 mos despite interventions with medically supervised weight management.  She  plans to talk to her PCP about treatment.  We discussed the importance of addressing mood with weight management.  She declined referral to psychology  Class 2 severe obesity due to excess calories with serious comorbidity and body mass index (BMI) of 37.0 to 37.9 in adult [facility]) -     Amb Referral To Provider Referral Exercise Program (P.R.E.P) She is a good candidate for the PREP program and agrees to this  S/P laparoscopic sleeve gastrectomy -     Amb Referral To Provider Referral Exercise Program (P.R.E.P) -     Amb ref to Medical Nutrition Therapy-MNT She has room for improvement resuming a post bariatric surgery diet  History of prediabetes -     Amb ref to Medical Nutrition Therapy-MNT  Chronic back pain, unspecified back location, unspecified back pain laterality Stable Continues to contribute to sedentary lifestyle and lack of regular exercise Agree with resuming water exercise and f/u with PCP for further management  OSA (obstructive sleep apnea) Reviewed results of her recent sleep study done at [facility] dated 03/23/24 showing mild OSA with an AHI of 11.6   Follow up with [facility] for further eval and treatment With AHI <15, she does not qualify for Zepbound RX  Sedentary lifestyle unchanged     She was informed of the importance of frequent follow up visits to maximize her success with intensive lifestyle modifications for her multiple health conditions.   ATTESTASTION STATEMENTS:  Reviewed by clinician on day of visit: allergies, medications, problem list, medical history, surgical history, family history, social history, and previous encounter notes pertinent to obesity diagnosis.   I have personally spent 32 minutes total time today in preparation, patient care, nutritional counseling and education,  and documentation for this visit, including the following: review of most recent clinical lab tests, prescribing medications/ refilling  medications, reviewing medical assistant documentation, review and interpretation of bioimpedence results.     Micky Albee, D.O. DABFM, DABOM [facility] [address] [place], [place] [address] [phone]

## 2024-05-02 NOTE — Patient Instructions (Addendum)
 Take Qsymia  dose 4 every other day until your RX runs out then stop  Try to track your calories, aim for 1200 cal/ day This should include 90-110 grams of protein daily - you can add in some healthy fats like nut butters, extra virgin olive oil, avocado, salmon, nuts/ seeds-- measure out quantities  [facility] In addition, keep up a goal for water exercise 2-3 days/ wk  Talk to your PCP about mood changes  Aim for a good 8 hr of sleep at night  Net weight gain: 22 lb in 7 mos  Referral made to nutrition

## 2024-05-03 ENCOUNTER — Ambulatory Visit
Admission: RE | Admit: 2024-05-03 | Discharge: 2024-05-03 | Disposition: A | Discharge status: Home / Self Care | Source: Ambulatory Visit | Attending: Internal Medicine | Admitting: Internal Medicine

## 2024-05-03 DIAGNOSIS — J849 Interstitial pulmonary disease, unspecified: Secondary | ICD-10-CM

## 2024-05-03 DIAGNOSIS — Z122 Encounter for screening for malignant neoplasm of respiratory organs: Secondary | ICD-10-CM

## 2024-05-03 DIAGNOSIS — Z720 Tobacco use: Secondary | ICD-10-CM

## 2024-05-03 DIAGNOSIS — R918 Other nonspecific abnormal finding of lung field: Secondary | ICD-10-CM

## 2024-05-03 DIAGNOSIS — J84117 Desquamative interstitial pneumonia: Secondary | ICD-10-CM

## 2024-05-03 DIAGNOSIS — C966 Unifocal Langerhans-cell histiocytosis: Secondary | ICD-10-CM

## 2024-05-05 ENCOUNTER — Telehealth: Payer: Self-pay

## 2024-05-05 NOTE — Telephone Encounter (Signed)
 Called re: PREP program referral, she would like to attend next class at [facility] on June 23, every M/W at 2pm; assessment visit scheduled for June 16 at 10-am

## 2024-05-16 NOTE — Progress Notes (Signed)
 [facility] PREP Evaluation  Patient Details  Name: Dayja Loveridge MRN: [id number] Date of Birth: [birth year] Age: 58 y.o. PCP: Ladonna Pickup, MD  Vitals:   05/16/24 1111  BP: (!) 127/92  Pulse: 82  SpO2: 98%  Weight: 223 lb 6.4 oz (101.3 kg)     [facility] Eval - 05/16/24 1100       [facility] PREP Location   [facility] PREP Location [facility]      Referral    Referring Provider Bowen    Reason for referral Current Smoker;High Cholesterol;Obesitity/Overweight;Inactivity;Other   Sleep apnea   Program Start Date 05/23/24    Program End Date 08/10/24      Measurement   Waist Circumference 48.5 inches    Hip Circumference 52 inches    Body fat 45.5 percent      Information for Trainer   Goals --   Lose weight, learn healthy tips, get active with hip and back issues   Current Exercise --   None   Orthopedic Concerns --   Back pain, hip bursitis   Pertinent Medical History --   COPD, OSA, gastric sleeve, high cholesterol     Mobility and Daily Activities   I find it easy to walk up or down two or more flights of stairs. 3    I have no trouble taking out the trash. 2    I do housework such as vacuuming and dusting on my own without difficulty. 2    I can easily lift a gallon of milk (8lbs). 4    I can easily walk a mile. 2    I have no trouble reaching into high cupboards or reaching down to pick up something from the floor. 4    I do not have trouble doing out-door work such as Loss adjuster, chartered, raking leaves, or gardening. 2      Mobility and Daily Activities   I feel younger than my age. 4    I feel independent. 4    I feel energetic. 1    I live an active life.  2    I feel strong. 4    I feel healthy. 1    I feel active as other people my age. 4      How fit and strong are you.   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    Rheumatoid arthritis (HCC)    Sleep apnea    CPAP    Sleep apnea    Past Surgical History:  Procedure Laterality Date   ABDOMINAL HYSTERECTOMY     APPENDECTOMY     BACK SURGERY     BRONCHIAL BIOPSY  05/05/2023   Procedure: BRONCHIAL BIOPSIES;  Surgeon: Prudy Brownie, DO;  Location: [facility];  Service: Thoracic;;   BRONCHIAL BRUSHINGS  05/05/2023   Procedure: BRONCHIAL BRUSHINGS;  Surgeon: Prudy Brownie, DO;  Location:  [facility];  Service: Thoracic;;   BRONCHIAL NEEDLE ASPIRATION BIOPSY  05/05/2023   Procedure: BRONCHIAL NEEDLE ASPIRATION BIOPSIES;  Surgeon: Prudy Brownie, DO;  Location: [facility];  Service: Thoracic;;   BRONCHIAL WASHINGS  05/05/2023   Procedure: BRONCHIAL WASHINGS;  Surgeon: Prudy Brownie, DO;  Location: [facility];  Service: Thoracic;;   CHOLECYSTECTOMY     LAPAROSCOPIC GASTRIC SLEEVE RESECTION N/A 01/07/2021   Procedure: LAPAROSCOPIC GASTRIC SLEEVE RESECTION AND HIATAL HERNIA REPAIR;  Surgeon: Jacolyn Matar, MD;  Location: [facility];  Service: General;  Laterality: N/A;   OVARIAN CYST REMOVAL     UPPER GI ENDOSCOPY N/A 01/07/2021   Procedure: UPPER GI ENDOSCOPY;  Surgeon: Jacolyn Matar, MD;  Location: [facility];  Service: General;  Laterality: N/A;   Social History   Tobacco Use  Smoking Status Every Day   Current packs/day: 3.00   Average packs/day: 3.0 packs/day for 47.5 years (142.4 ttl pk-yrs)   Types: Cigarettes   Start date: 1978  Smokeless Tobacco Never  Tobacco Comments   Smokes 4-5 cigarettes a day as of 04/15/23 ep  Ready to start PREP class on June 23 at [facility].  Majel Scott 05/16/2024, 11:21 AM

## 2024-05-23 NOTE — Progress Notes (Signed)
 [facility] PREP Weekly Session  Patient Details  Name: Fumiye Lubben MRN: [id number] Date of Birth: 15-Jul-1966 Age: 58 y.o. PCP: Dorena Fernando HERO, MD  There were no vitals filed for this visit.   [facility] Weekly seesion - 05/23/24 1500       [facility] PREP Location   [facility] PREP Location [facility]      Weekly Session   Topic Discussed Goal setting and welcome to the program   Introductions, review of notebook, tour of facility; offered option to work out on cardio machine.   Classes attended to date 1          Adonna KATHEE Mody 05/23/2024, 3:36 PM

## 2024-05-31 ENCOUNTER — Encounter: Payer: Self-pay | Admitting: Internal Medicine

## 2024-05-31 ENCOUNTER — Encounter: Admitting: Internal Medicine

## 2024-05-31 ENCOUNTER — Encounter

## 2024-05-31 NOTE — Patient Instructions (Signed)
 ICD-10-CM   1. 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 Encounter for smoking cessation counseling  Z71.6     4. Tobacco abuse  Z72.0     5. Atypical chest pain  R07.89     6. DOE (dyspnea on exertion)  R06.09     7. 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  Diagnosis based on age, smoking, features on the CT scan and nondiagnostic bronchoscopy.  This condition typically improves/cures with quitting smoking but will progress with continued smoking. - There is also mild inflammation along with HX and we believe this is also smoking-related  -There is barely this condition in 2019 and is definitely more prominent as of May 2024  - PFT Aug 2024 shows impaired lung function 1/2 to 2/3rd normal. Concern if you do not quit smoking this will get worse  Plan  - do HRCT in summer 2025 -schedule May 03, 2024 - Do spirometry and DLCO June 2025    Atypical chest pain with reproducible tenderness  -This resulted in ER visit in April 2025  Plan  - I have emailed the radiologist to see if he should get a bone scan - Check blood ESR, ANA, rheumatoid factor, CCP, double-stranded DNA, SSA and SSB to see if there is any autoimmune cause for her bony pain.  Lung cancer screening age > 77  Plan  - capture information in HRCT in summer 2025 -June 2025  Encounter for smoking cessation counseling Tobacco abuse  - unable to quit on own  Plan - - call [place] quit smoking program as discussed in the past  Followup -June 2025 with Dr. Bertrum Brodie after CT scan of the chest

## 2024-05-31 NOTE — Progress Notes (Signed)
 This encounter was created in error - please disregard.

## 2024-06-07 NOTE — Progress Notes (Signed)
 [facility] PREP Weekly Session  Patient Details  Name: Jamie-Lee Galdamez MRN: [id number] Date of Birth: 05/20/1966 Age: 58 y.o. PCP: Dorena Fernando HERO, MD  There were no vitals filed for this visit.   [facility] Weekly seesion - 06/07/24 1300       [facility] PREP Location   [facility] PREP Location [facility]      Weekly Session   Topic Discussed Goal setting and welcome to the program   Introductions, review of notebook; tour of facility; offered option to work out on cardio machine   Classes attended to date 1          Kolston Lacount B Alix Lahmann 06/07/2024, 1:52 PM

## 2024-07-05 ENCOUNTER — Ambulatory Visit: Admitting: Family Medicine

## 2024-07-05 ENCOUNTER — Emergency Department (HOSPITAL_COMMUNITY)

## 2024-07-05 ENCOUNTER — Other Ambulatory Visit: Payer: Self-pay

## 2024-07-05 ENCOUNTER — Encounter (HOSPITAL_COMMUNITY): Payer: Self-pay | Admitting: Emergency Medicine

## 2024-07-05 ENCOUNTER — Emergency Department (HOSPITAL_COMMUNITY)
Admission: EM | Admit: 2024-07-05 | Discharge: 2024-07-05 | Disposition: A | Discharge status: Home / Self Care | Attending: Emergency Medicine | Admitting: Emergency Medicine

## 2024-07-05 DIAGNOSIS — M545 Low back pain, unspecified: Secondary | ICD-10-CM | POA: Diagnosis present

## 2024-07-05 MED ORDER — DICLOFENAC SODIUM 1 % EX GEL: 4.0000 g | Freq: Four times a day (QID) | CUTANEOUS | 0 refills | Status: AC

## 2024-07-05 MED ORDER — KETOROLAC TROMETHAMINE 15 MG/ML IJ SOLN
15.0000 mg | Freq: Once | INTRAMUSCULAR | Status: AC
  Administered 2024-07-05: 15 mg via INTRAMUSCULAR
  Filled 2024-07-05: qty 1

## 2024-07-05 MED ORDER — METHYLPREDNISOLONE 4 MG PO TBPK: ORAL_TABLET | ORAL | 0 refills | Status: AC

## 2024-07-05 NOTE — Discharge Instructions (Signed)
 Your back pain is most likely due to a muscular strain.  There is been a lot of research on back pain, unfortunately the only thing that seems to really help is Tylenol  and ibuprofen .  Relative rest is also important to not lift greater than 10 pounds bending or twisting at the waist.  Please follow-up with your family physician.  The other thing that really seems to benefit patients is physical therapy which your doctor may send you for.  Please return to the emergency department for new numbness or weakness to your arms or legs. Difficulty with urinating or urinating or pooping on yourself.  Also if you cannot feel toilet paper when you wipe or get a fever.   Use the steroids as prescribed.  Also take tylenol  1000mg (2 extra strength) four times a day.

## 2024-07-05 NOTE — ED Triage Notes (Signed)
 Pt reports back pain since falling 2 days ago, hx of fusion to back with rod and screws placed

## 2024-07-05 NOTE — ED Provider Notes (Addendum)
 [place] EMERGENCY DEPARTMENT AT [facility] Provider Note   CSN: [id number] Arrival date & time: 07/05/24  2001     Patient presents with: Back Pain   Brittney Sanford is a 57 y.o. female.   58 yo F with a chief complaint of low back pain.  Going on for a couple days.  Right-sided no radiation.  No loss of bowel or bladder no loss.  Sensation no weakness or numbness in the leg.  Has a history of a spinal fusion.   Back Pain      Prior to Admission medications   Medication Sig Start Date End Date Taking? Authorizing Provider  diclofenac  Sodium (VOLTAREN ) 1 % GEL Apply 4 g topically 4 (four) times daily. 07/05/24  Yes Emil Share, DO  methylPREDNISolone  (MEDROL  DOSEPAK) 4 MG TBPK tablet Day 1: 8mg  before breakfast, 4 mg after lunch, 4 mg after supper, and 8 mg at bedtime Day 2: 4 mg before breakfast, 4 mg after lunch, 4 mg  after supper, and 8 mg  at bedtime Day 3:  4 mg  before breakfast, 4 mg  after lunch, 4 mg after supper, and 4 mg  at bedtime Day 4: 4 mg  before breakfast, 4 mg  after lunch, and 4 mg at bedtime Day 5: 4 mg  before breakfast and 4 mg at bedtime Day 6: 4 mg  before breakfast 07/05/24  Yes Aleysha Meckler, DO  albuterol  (PROVENTIL ) (2.5 MG/3ML) 0.083% nebulizer solution Take 3 mLs (2.5 mg total) by nebulization every 6 (six) hours as needed for wheezing or shortness of breath. 04/09/23   Icard, Adine CROME, DO  albuterol  (VENTOLIN  HFA) 108 (90 Base) MCG/ACT inhaler Inhale 2 puffs into the lungs every 6 (six) hours as needed for wheezing or shortness of breath.    [provider]  BIOTIN PO Take by mouth.    [provider]  budeson-glycopyrrolate-formoterol (BREZTRI  AEROSPHERE) 160-9-4.8 MCG/ACT AERO inhaler INHALE 2 PUFFS INTO LUNGS IN THE MORNING AND AT BEDTIME 03/19/24   Geronimo Amel, MD  Calcium Carb-Cholecalciferol (CALCIUM 500 + D3 PO) Place 1 patch onto the skin daily. Patch Aid Vitamin D3 + Calcium Patch    [provider]   cyanocobalamin  (VITAMIN B12) 500 MCG tablet Take 1 tablet (500 mcg total) by mouth daily. 11/04/23   Bowen, Darice BRAVO, DO  cyclobenzaprine  (FLEXERIL ) 10 MG tablet Take 10 mg by mouth 3 (three) times daily as needed for muscle spasms.    [provider]  ergocalciferol  (VITAMIN D2) 1.25 MG (50000 UT) capsule Take 50,000 Units by mouth every Sunday.    [provider]  esomeprazole (NEXIUM) 40 MG capsule Take 40 mg by mouth daily. 08/16/23   [provider]  estradiol (VIVELLE-DOT) 0.0375 MG/24HR Place 1 patch onto the skin 2 (two) times a week. Sundays & Thursdays.    [provider]  folic acid (FOLVITE) 1 MG tablet Take 1 mg by mouth in the morning.    [provider]  gabapentin  (NEURONTIN ) 300 MG capsule Take 300 mg by mouth 3 (three) times daily as needed (nerve pain).    [provider]  meclizine  (ANTIVERT ) 25 MG tablet Take 1 tablet (25 mg total) by mouth 3 (three) times daily as needed for dizziness. 08/12/23   Randol Simmonds, MD  meloxicam (MOBIC) 7.5 MG tablet Take 7.5 mg by mouth 2 (two) times daily as needed (pain.).    [provider]  MULTIPLE VITAMINS-IRON PO Place 1 patch onto  the skin daily. Patch Aid Iron Plus Multivitamin Patch    [provider]  oxyCODONE -acetaminophen  (PERCOCET) 10-325 MG tablet Take 1 tablet by mouth in the morning, at noon, and at bedtime. 12/27/15   [provider]  Phentermine -Topiramate  (QSYMIA ) 15-92 MG CP24 1 capsule po QAM 03/31/24   Bowen, Karen E, DO  rosuvastatin (CRESTOR) 10 MG tablet Take 10 mg by mouth at bedtime.    [provider]  TRULANCE 3 MG TABS Take 3 mg by mouth in the morning.    [provider]    Allergies: Lithium and Aspirin    Review of Systems  Musculoskeletal:  Positive for back pain.    Updated Vital Signs BP (!) 145/93   Pulse (!) 58   Temp 98.3 F (36.8 C)   Resp 16   Ht 5' 3.5 (1.613 m)   Wt 91.2 kg   SpO2 99%   BMI 35.05  kg/m   Physical Exam Vitals and nursing note reviewed.  Constitutional:      General: She is not in acute distress.    Appearance: She is well-developed. She is not diaphoretic.  HENT:     Head: Normocephalic and atraumatic.  Eyes:     Pupils: Pupils are equal, round, and reactive to light.  Cardiovascular:     Rate and Rhythm: Normal rate and regular rhythm.     Heart sounds: No murmur heard.    No friction rub. No gallop.  Pulmonary:     Effort: Pulmonary effort is normal.     Breath sounds: No wheezing or rales.  Abdominal:     General: There is no distension.     Palpations: Abdomen is soft.     Tenderness: There is no abdominal tenderness.  Musculoskeletal:        General: No tenderness.     Cervical back: Normal range of motion and neck supple.     Comments: No obvious midline spinal tenderness step-offs or deformities.  Pulse motor and sensation intact distally.  Reflexes are 2+ and equal.  No clonus.  Skin:    General: Skin is warm and dry.  Neurological:     Mental Status: She is alert and oriented to person, place, and time.  Psychiatric:        Behavior: Behavior normal.     (all labs ordered are listed, but only abnormal results are displayed) Labs Reviewed - No data to display  EKG: None  Radiology: DG Lumbar Spine Complete Result Date: 07/05/2024 EXAM: 4 VIEW(S) XRAY OF THE LUMBAR SPINE 07/05/2024 09:31:33 PM COMPARISON: Comparison with CT lumbar spine 10/05/2023. CLINICAL HISTORY: Low back pain. [place] two days ago, low back pain. FINDINGS: LUMBAR SPINE: Posterior fusion L3-S1 with interbody spacers. No radiographic evidence of loosening, no evidence of acute fracture or traumatic malalignment. Intervertebral disc space is maintained. BONES: No acute fracture. DISCS AND DEGENERATIVE CHANGES: Intervertebral disc space is maintained. SOFT TISSUES: No acute abnormality. IMPRESSION: 1. No acute fracture or traumatic malalignment. Electronically signed by: Norman Gatlin MD 07/05/2024 09:45 PM EDT RP Workstation: [id number]   DG Sacrum/Coccyx Result Date: 07/05/2024 CLINICAL DATA:  Fall and back pain. EXAM: SACRUM AND COCCYX - 2+ VIEW COMPARISON:  Radiograph dated 02/11/2021. FINDINGS: No acute fracture or dislocation. Lower lumbar and sacral posterior fusion noted. The soft tissues are unremarkable. IMPRESSION: No acute fracture or dislocation. Electronically Signed   By: Vanetta Chou M.D.   On: 07/05/2024 20:56     Procedures   Medications  Ordered in the ED  ketorolac  (TORADOL ) 15 MG/ML injection 15 mg (15 mg Intramuscular Given 07/05/24 2212)                                    Medical Decision Making Amount and/or Complexity of Data Reviewed Radiology: ordered.  Risk Prescription drug management.   58 yo F with a chief complaints of right-sided low back pain after fall couple days ago.  Initially had a plain film of the coccyx without obvious fracture.  She is pointing to pain more to the right of the L-spine.  Plain film of the L-spine without obvious fracture or change to her underlying hardware on my independent interpretation.  10:13 PM:  I have discussed the diagnosis/risks/treatment options with the patient.  Evaluation and diagnostic testing in the emergency department does not suggest an emergent condition requiring admission or immediate intervention beyond what has been performed at this time.  They will follow up with PCP. We also discussed returning to the ED immediately if new or worsening sx occur. We discussed the sx which are most concerning (e.g., sudden worsening pain, fever, inability to tolerate by mouth) that necessitate immediate return. Medications administered to the patient during their visit and any new prescriptions provided to the patient are listed below.  Medications given during this visit Medications  ketorolac  (TORADOL ) 15 MG/ML injection 15 mg (15 mg Intramuscular Given 07/05/24 2212)     The patient  appears reasonably screen and/or stabilized for discharge and I doubt any other medical condition or other [facility] requiring further screening, evaluation, or treatment in the ED at this time prior to discharge.       Final diagnoses:  Acute right-sided low back pain without sciatica    ED Discharge Orders          Ordered    methylPREDNISolone  (MEDROL  DOSEPAK) 4 MG TBPK tablet        07/05/24 2156    diclofenac  Sodium (VOLTAREN ) 1 % GEL  4 times daily        07/05/24 2157               Emil Share, DO 07/05/24 2212    Emil Share, DO 07/05/24 2213

## 2024-07-30 ENCOUNTER — Other Ambulatory Visit: Payer: Self-pay

## 2024-07-30 ENCOUNTER — Emergency Department (HOSPITAL_COMMUNITY)

## 2024-07-30 ENCOUNTER — Emergency Department (HOSPITAL_COMMUNITY)
Admission: EM | Admit: 2024-07-30 | Discharge: 2024-07-30 | Disposition: A | Discharge status: Home / Self Care | Attending: Emergency Medicine | Admitting: Emergency Medicine

## 2024-07-30 ENCOUNTER — Encounter (HOSPITAL_COMMUNITY): Payer: Self-pay | Admitting: Emergency Medicine

## 2024-07-30 DIAGNOSIS — Z5329 Procedure and treatment not carried out because of patient's decision for other reasons: Secondary | ICD-10-CM | POA: Diagnosis not present

## 2024-07-30 DIAGNOSIS — R112 Nausea with vomiting, unspecified: Secondary | ICD-10-CM | POA: Insufficient documentation

## 2024-07-30 DIAGNOSIS — J449 Chronic obstructive pulmonary disease, unspecified: Secondary | ICD-10-CM | POA: Insufficient documentation

## 2024-07-30 DIAGNOSIS — Z720 Tobacco use: Secondary | ICD-10-CM | POA: Diagnosis not present

## 2024-07-30 DIAGNOSIS — R079 Chest pain, unspecified: Secondary | ICD-10-CM | POA: Diagnosis present

## 2024-07-30 DIAGNOSIS — D72829 Elevated white blood cell count, unspecified: Secondary | ICD-10-CM | POA: Insufficient documentation

## 2024-07-30 LAB — COMPREHENSIVE METABOLIC PANEL WITH GFR
ALT: 12 U/L (ref 0–44)
AST: 19 U/L (ref 15–41)
Albumin: 3.9 g/dL (ref 3.5–5.0)
Alkaline Phosphatase: 73 U/L (ref 38–126)
Anion gap: 12 (ref 5–15)
BUN: 10 mg/dL (ref 6–20)
CO2: 21 mmol/L — ABNORMAL LOW (ref 22–32)
Calcium: 10 mg/dL (ref 8.9–10.3)
Chloride: 106 mmol/L (ref 98–111)
Creatinine, Ser: 0.92 mg/dL (ref 0.44–1.00)
GFR, Estimated: >60 mL/min (ref >60)
Glucose, Bld: 84 mg/dL (ref 70–99)
Potassium: 4 mmol/L (ref 3.5–5.1)
Sodium: 139 mmol/L (ref 135–145)
Total Bilirubin: 1 mg/dL (ref 0.0–1.2)
Total Protein: 7.3 g/dL (ref 6.5–8.1)

## 2024-07-30 LAB — CBC
HCT: 42.8 % (ref 36.0–46.0)
Hemoglobin: 14.1 g/dL (ref 12.0–15.0)
MCH: 29.9 pg (ref 26.0–34.0)
MCHC: 32.9 g/dL (ref 30.0–36.0)
MCV: 90.9 fL (ref 80.0–100.0)
Platelets: 182 K/uL (ref 150–400)
RBC: 4.71 MIL/uL (ref 3.87–5.11)
RDW: 15 % (ref 11.5–15.5)
WBC: 10.7 K/uL — ABNORMAL HIGH (ref 4.0–10.5)
nRBC: 0 % (ref 0.0–0.2)

## 2024-07-30 LAB — LIPASE, BLOOD: Lipase: 28 U/L (ref 11–51)

## 2024-07-30 LAB — TROPONIN I (HIGH SENSITIVITY): Troponin I (High Sensitivity): 5 ng/L (ref <18)

## 2024-07-30 MED ORDER — MORPHINE SULFATE (PF) 4 MG/ML IV SOLN
4.0000 mg | Freq: Once | INTRAVENOUS | Status: AC
  Administered 2024-07-30: 4 mg via INTRAVENOUS
  Filled 2024-07-30: qty 1

## 2024-07-30 MED ORDER — ONDANSETRON HCL 4 MG/2ML IJ SOLN
4.0000 mg | Freq: Once | INTRAMUSCULAR | Status: AC
  Administered 2024-07-30: 4 mg via INTRAVENOUS
  Filled 2024-07-30: qty 2

## 2024-07-30 MED ORDER — SODIUM CHLORIDE 0.9 % IV BOLUS
1000.0000 mL | Freq: Once | INTRAVENOUS | Status: AC
  Administered 2024-07-30: 1000 mL via INTRAVENOUS

## 2024-07-30 MED ORDER — NITROGLYCERIN 0.4 MG SL SUBL: 0.4000 mg | SUBLINGUAL_TABLET | SUBLINGUAL | Status: DC | PRN

## 2024-07-30 NOTE — ED Notes (Addendum)
Pt requesting to leave AMA

## 2024-07-30 NOTE — ED Triage Notes (Signed)
 Pt BIB [facility] from home due to nausea and vomiting that started yesterday.  Pt reports today it started to look like coffee ground emesis along with chest pain that started at 0745 this morning 07/30/2024.  Pt not on blood thinners and allergic to aspirin.  Pt given 1.4 sublingual nitroglycerin  and took pain from 10/10 to 5/10.  18g left forearm.  4mg  Zofran  given en route.  250 NS. VS BP 130/89, HR 80, SpO2 100% RA. CBG 96

## 2024-07-30 NOTE — ED Notes (Signed)
Patient placed onto cardiac monitor. RN is at bedside at this time.

## 2024-07-30 NOTE — ED Provider Notes (Signed)
 [place] EMERGENCY DEPARTMENT AT [facility] Provider Note   CSN: [id number] Arrival date & time: 07/30/24  9082     Patient presents with: Chest Pain and Nausea (Vomiting)   Brittney Sanford is a 58 y.o. female with past medical history significant for previous sleeve gastrectomy, GERD, COPD, tobacco use, chronic back pain who presents concern for nausea, vomiting that started yesterday without significant abdominal pain, patient does endorse some chest pain.  She denies shortness of breath.  Abdominal pain started this morning while she was laying in bed.  She reports that she has had innumerable episodes of vomiting since yesterday.    Chest Pain      Prior to Admission medications   Medication Sig Start Date End Date Taking? Authorizing Provider  albuterol  (PROVENTIL ) (2.5 MG/3ML) 0.083% nebulizer solution Take 3 mLs (2.5 mg total) by nebulization every 6 (six) hours as needed for wheezing or shortness of breath. 04/09/23   Icard, Adine CROME, DO  albuterol  (VENTOLIN  HFA) 108 (90 Base) MCG/ACT inhaler Inhale 2 puffs into the lungs every 6 (six) hours as needed for wheezing or shortness of breath.    [provider]  BIOTIN PO Take by mouth.    [provider]  budeson-glycopyrrolate-formoterol (BREZTRI  AEROSPHERE) 160-9-4.8 MCG/ACT AERO inhaler INHALE 2 PUFFS INTO LUNGS IN THE MORNING AND AT BEDTIME 03/19/24   Geronimo Amel, MD  Calcium Carb-Cholecalciferol (CALCIUM 500 + D3 PO) Place 1 patch onto the skin daily. Patch Aid Vitamin D3 + Calcium Patch    [provider]  cyanocobalamin  (VITAMIN B12) 500 MCG tablet Take 1 tablet (500 mcg total) by mouth daily. 11/04/23   Bowen, Darice BRAVO, DO  cyclobenzaprine  (FLEXERIL ) 10 MG tablet Take 10 mg by mouth 3 (three) times daily as needed for muscle spasms.    [provider]  diclofenac  Sodium (VOLTAREN ) 1 % GEL Apply 4 g topically 4 (four) times daily. 07/05/24   Emil Share, DO  ergocalciferol   (VITAMIN D2) 1.25 MG (50000 UT) capsule Take 50,000 Units by mouth every Sunday.    [provider]  esomeprazole (NEXIUM) 40 MG capsule Take 40 mg by mouth daily. 08/16/23   [provider]  estradiol (VIVELLE-DOT) 0.0375 MG/24HR Place 1 patch onto the skin 2 (two) times a week. Sundays & Thursdays.    [provider]  folic acid (FOLVITE) 1 MG tablet Take 1 mg by mouth in the morning.    [provider]  gabapentin  (NEURONTIN ) 300 MG capsule Take 300 mg by mouth 3 (three) times daily as needed (nerve pain).    [provider]  meclizine  (ANTIVERT ) 25 MG tablet Take 1 tablet (25 mg total) by mouth 3 (three) times daily as needed for dizziness. 08/12/23   Randol Simmonds, MD  meloxicam (MOBIC) 7.5 MG tablet Take 7.5 mg by mouth 2 (two) times daily as needed (pain.).    [provider]  methylPREDNISolone  (MEDROL  DOSEPAK) 4 MG TBPK tablet Day 1: 8mg  before breakfast, 4 mg after lunch, 4 mg after supper, and 8 mg at bedtime Day 2: 4 mg before breakfast, 4 mg after lunch, 4 mg  after supper, and 8 mg  at bedtime Day 3:  4 mg  before breakfast, 4 mg  after lunch, 4 mg after supper, and 4 mg  at bedtime Day 4: 4 mg  before breakfast, 4 mg  after lunch, and 4 mg at bedtime Day 5: 4 mg  before breakfast and 4 mg at bedtime Day  6: 4 mg  before breakfast 07/05/24   Floyd, Dan, DO  MULTIPLE VITAMINS-IRON PO Place 1 patch onto the skin daily. Patch Aid Iron Plus Multivitamin Patch    [provider]  oxyCODONE -acetaminophen  (PERCOCET) 10-325 MG tablet Take 1 tablet by mouth in the morning, at noon, and at bedtime. 12/27/15   [provider]  Phentermine -Topiramate  (QSYMIA ) 15-92 MG CP24 1 capsule po QAM 03/31/24   Bowen, Darice BRAVO, DO  rosuvastatin (CRESTOR) 10 MG tablet Take 10 mg by mouth at bedtime.    [provider]  TRULANCE 3 MG TABS Take 3 mg by mouth in the morning.    [provider]    Allergies: Lithium and Aspirin     Review of Systems  Cardiovascular:  Positive for chest pain.  All other systems reviewed and are negative.   Updated Vital Signs BP 138/80   Pulse (!) 58   Temp 97.7 F (36.5 C) (Oral)   Resp 17   Ht 5' 3 (1.6 m)   Wt 91.6 kg   SpO2 100%   BMI 35.78 kg/m   Physical Exam Vitals and nursing note reviewed.  Constitutional:      General: She is not in acute distress.    Appearance: Normal appearance.  HENT:     Head: Normocephalic and atraumatic.  Eyes:     General:        Right eye: No discharge.        Left eye: No discharge.  Cardiovascular:     Rate and Rhythm: Normal rate and regular rhythm.     Heart sounds: No murmur heard.    No friction rub. No gallop.  Pulmonary:     Effort: Pulmonary effort is normal.     Breath sounds: Normal breath sounds.     Comments: No wheezing, rhonchi, stridor, rales Abdominal:     General: Bowel sounds are normal.     Palpations: Abdomen is soft.  Skin:    General: Skin is warm and dry.     Capillary Refill: Capillary refill takes less than 2 seconds.  Neurological:     Mental Status: She is alert and oriented to person, place, and time.  Psychiatric:        Mood and Affect: Mood normal.        Behavior: Behavior normal.     (all labs ordered are listed, but only abnormal results are displayed) Labs Reviewed  COMPREHENSIVE METABOLIC PANEL WITH GFR - Abnormal; Notable for the following components:      Result Value   CO2 21 (*)    All other components within normal limits  CBC - Abnormal; Notable for the following components:   WBC 10.7 (*)    All other components within normal limits  LIPASE, BLOOD  URINALYSIS, ROUTINE W REFLEX MICROSCOPIC  TROPONIN I (HIGH SENSITIVITY)  TROPONIN I (HIGH SENSITIVITY)    EKG: None  Radiology: DG Chest Portable 1 View Result Date: 07/30/2024 EXAM: 1 VIEW(S) XRAY OF THE CHEST 07/30/2024 10:34:00 AM COMPARISON: Comparison with 05/03/2024 chest CT and radiograph 03/15/2024.  CLINICAL HISTORY: Chest pain, nausea. FINDINGS: LUNGS AND PLEURA: No focal pulmonary opacity. No pulmonary edema. No pleural effusion. No pneumothorax. Chronic interstitial coarsening compatible with interstitial lung disease. HEART AND MEDIASTINUM: No acute abnormality of the cardiac and mediastinal silhouettes. BONES AND SOFT TISSUES: No acute osseous abnormality. IMPRESSION: 1. No acute cardiopulmonary pathology. 2. Chronic interstitial coarsening compatible with interstitial lung disease. Electronically signed by: Norman Gatlin MD  07/30/2024 10:39 AM EDT RP Workstation: [id number]     Procedures   Medications Ordered in the ED  nitroGLYCERIN  (NITROSTAT ) SL tablet 0.4 mg (has no administration in time range)  morphine  (PF) 4 MG/ML injection 4 mg (4 mg Intravenous Given 07/30/24 1024)  ondansetron  (ZOFRAN ) injection 4 mg (4 mg Intravenous Given 07/30/24 1024)  sodium chloride  0.9 % bolus 1,000 mL (0 mLs Intravenous Stopped 07/30/24 1213)                                    Medical Decision Making Amount and/or Complexity of Data Reviewed Labs: ordered. Radiology: ordered.  Risk Prescription drug management.   This patient is a 58 y.o. female  who presents to the ED for concern of chest pain, nausea, vomiting.   Differential diagnoses prior to evaluation: The emergent differential diagnosis includes, but is not limited to,  ACS, AAS, PE, Mallory-Weiss, Boerhaave's, Pneumonia, acute bronchitis, asthma or COPD exacerbation, anxiety, MSK pain or traumatic injury to the chest, acid reflux versus other . This is not an exhaustive differential.   Past Medical History / Co-morbidities / Social History: sleeve gastrectomy, GERD, COPD, tobacco use, chronic back pain  Additional history: Chart reviewed. Pertinent results include: Reviewed previous emergency department evaluations, outpatient family medicine visits  Physical Exam: Physical exam performed. The pertinent findings include: Some  mild hypertension during ED stay, initially 125/86, worsened to 153/102 at recheck, but 138/80 at time of discharge.  Mild bradycardia at time of discharge, pulse 58.  Under percent oxygen saturation on room air, afebrile.  Lab Tests/Imaging studies: I personally interpreted labs/imaging and the pertinent results include: CBC with mild leukocytosis, blood cells 10.7, initial troponin is normal at 5, negative lipase, CMP overall unremarkable.  Plain film chest x-ray with some chronic interstitial lung disease changes, no acute infiltrate or other abnormality noted.. I agree with the radiologist interpretation.  Cardiac monitoring: EKG obtained and interpreted by myself and attending physician which shows: Normal sinus rhythm, no acute ST-T changes   Medications: I ordered medication including fluid bolus, morphine , Zofran  for pain, nausea.  I have reviewed the patients home medicines and have made adjustments as needed.   Disposition: Prior to completing her treatment including reassessment of her symptoms, delta troponin given the onset of her chest pain chest this morning patient left AMA without giving this provider a chance to speak with her about the risk, benefits of leaving without completing treatment.  No evidence of acute pathology on initial assessment but again with the timing of her chest pain delta troponin would be clinically indicated to ensure no cardiac cause.  Overall suspect gastritis/esophagitis related to her nausea, vomiting is most likely.  Final diagnoses:  Chest pain, unspecified type    ED Discharge Orders     None          Rosan Sherlean VEAR DEVONNA 07/30/24 1247    Yolande Lamar BROCKS, MD 08/02/24 [phone]

## 2024-08-11 ENCOUNTER — Telehealth: Payer: Self-pay | Admitting: Internal Medicine

## 2024-08-11 DIAGNOSIS — I288 Other diseases of pulmonary vessels: Secondary | ICD-10-CM

## 2024-08-11 DIAGNOSIS — J849 Interstitial pulmonary disease, unspecified: Secondary | ICD-10-CM

## 2024-08-11 NOTE — Telephone Encounter (Signed)
 Brittney Sanford\=    Saw her in April 2025.  Asked for pulmonary function test and CT in June 2025.  She did have CT scan June 2025 but I do not see pulmonary function test at all.  I do not see follow-up at all  Plan - I will add on the CT chest to the pulmonary fibrosis stable -Therefore we can skip the pulmonary function test - However,  also enlarged pulmonary artery therefore -> please order echocardiogram  -I can see in the next few months 15-minute visit      IMPRESSION: 1. Pulmonary Langerhans cell histiocytosis, stable. Associated pulmonary nodules and nodular densities, measuring up to 8 mm in the left upper lobe, stable. Recommend follow-up in 1 year as malignancy cannot be definitively excluded. 2. Mildly patulous esophagus contains food debris, findings which can be seen with dysmotility and/or gastroesophageal reflux. 3.  Aortic atherosclerosis (ICD10-I70.0). 4. Enlarged pulmonic trunk, indicative of pulmonary arterial hypertension. 5.  Emphysema (ICD10-J43.9).     Electronically Signed   By: Newell Eke M.D.   On: 05/06/2024 10:19

## 2024-08-12 NOTE — Telephone Encounter (Signed)
 Ok her fibrosis is stable on CT June 2025 Last PFT ws in summer 2024   PLAN so please get a spiro/dlco in next 3 months Also get an echocardiogram before she sees me because PA was enlarged Give an OV 15 min in next 3 months  Sorry for confusion

## 2024-08-12 NOTE — Telephone Encounter (Signed)
 Dr Geronimo, can you please advise this that way the pt will understand.

## 2024-08-18 NOTE — Telephone Encounter (Signed)
 Called patient placed ordered,scheduled pft and follow up,should expect call for echo.patient aware.NFN at this time

## 2024-09-07 ENCOUNTER — Emergency Department (HOSPITAL_BASED_OUTPATIENT_CLINIC_OR_DEPARTMENT_OTHER): Admission: EM | Admit: 2024-09-07 | Discharge: 2024-09-07 | Disposition: A | Discharge status: Home / Self Care

## 2024-09-07 ENCOUNTER — Other Ambulatory Visit: Payer: Self-pay

## 2024-09-07 ENCOUNTER — Encounter (HOSPITAL_BASED_OUTPATIENT_CLINIC_OR_DEPARTMENT_OTHER): Payer: Self-pay | Admitting: Emergency Medicine

## 2024-09-07 DIAGNOSIS — I726 Aneurysm of vertebral artery: Secondary | ICD-10-CM | POA: Diagnosis not present

## 2024-09-07 DIAGNOSIS — R519 Headache, unspecified: Secondary | ICD-10-CM | POA: Diagnosis present

## 2024-09-07 DIAGNOSIS — F17200 Nicotine dependence, unspecified, uncomplicated: Secondary | ICD-10-CM | POA: Insufficient documentation

## 2024-09-07 LAB — CBC WITH DIFFERENTIAL/PLATELET
Abs Immature Granulocytes: 0.02 K/uL (ref 0.00–0.07)
Basophils Absolute: 0 K/uL (ref 0.0–0.1)
Basophils Relative: 1 %
Eosinophils Absolute: 0.1 K/uL (ref 0.0–0.5)
Eosinophils Relative: 1 %
HCT: 40.3 % (ref 36.0–46.0)
Hemoglobin: 13.8 g/dL (ref 12.0–15.0)
Immature Granulocytes: 0 %
Lymphocytes Relative: 32 %
Lymphs Abs: 2.6 K/uL (ref 0.7–4.0)
MCH: 31 pg (ref 26.0–34.0)
MCHC: 34.2 g/dL (ref 30.0–36.0)
MCV: 90.6 fL (ref 80.0–100.0)
Monocytes Absolute: 0.4 K/uL (ref 0.1–1.0)
Monocytes Relative: 5 %
Neutro Abs: 4.9 K/uL (ref 1.7–7.7)
Neutrophils Relative %: 61 %
Platelets: 174 K/uL (ref 150–400)
RBC: 4.45 MIL/uL (ref 3.87–5.11)
RDW: 14.7 % (ref 11.5–15.5)
WBC: 8.1 K/uL (ref 4.0–10.5)
nRBC: 0 % (ref 0.0–0.2)

## 2024-09-07 LAB — BASIC METABOLIC PANEL WITH GFR
Anion gap: 10 (ref 5–15)
BUN: 7 mg/dL (ref 6–20)
CO2: 26 mmol/L (ref 22–32)
Calcium: 9.5 mg/dL (ref 8.9–10.3)
Chloride: 107 mmol/L (ref 98–111)
Creatinine, Ser: 0.73 mg/dL (ref 0.44–1.00)
GFR, Estimated: >60 mL/min (ref >60)
Glucose, Bld: 81 mg/dL (ref 70–99)
Potassium: 4.1 mmol/L (ref 3.5–5.1)
Sodium: 143 mmol/L (ref 135–145)

## 2024-09-07 MED ORDER — DIPHENHYDRAMINE HCL 50 MG/ML IJ SOLN
25.0000 mg | Freq: Once | INTRAMUSCULAR | Status: AC
  Administered 2024-09-07: 25 mg via INTRAVENOUS
  Filled 2024-09-07: qty 1

## 2024-09-07 MED ORDER — PROCHLORPERAZINE EDISYLATE 10 MG/2ML IJ SOLN
10.0000 mg | Freq: Once | INTRAMUSCULAR | Status: AC
  Administered 2024-09-07: 10 mg via INTRAVENOUS
  Filled 2024-09-07: qty 2

## 2024-09-07 MED ORDER — SODIUM CHLORIDE 0.9 % IV BOLUS
1000.0000 mL | Freq: Once | INTRAVENOUS | Status: AC
  Administered 2024-09-07: 1000 mL via INTRAVENOUS

## 2024-09-07 NOTE — ED Notes (Signed)
 Pt understands  and states that she is leaving against medical advice and undersstands all consequences including death

## 2024-09-07 NOTE — ED Notes (Signed)
 Pt states wants to leave and NOT get her CT, PA into speak to her and pt told her same thing  I V is out

## 2024-09-07 NOTE — ED Provider Notes (Signed)
 [facility] Provider Note   CSN: [id number] Arrival date & time: 09/07/24  1001     Patient presents with: Headache   Brittney Sanford is a 58 y.o. female.   58 year old female presenting with migraine.  Patient has history of migraines, reports that she scheduled an appointment with a PA at her PCP office earlier today for a Toradol  injection, however when she presented to her PCP office and described the nature of her symptoms they recommended that she proceed to the emergency department given her history of left-sided vertebral artery aneurysm with a change in her migraine symptoms.  Patient is followed by neurology, had recent CTA neck done on 10/1 which showed unchanged 2 mm vertebral artery aneurysm.  She reports 1 month of off-and-on migraine symptoms, states her symptoms are different today and that the majority of the pain has been down the back of her neck since yesterday evening, she also complains of some frontal discomfort as well with associated lightheadedness at times, also endorses nausea/vomiting.  She took a sumatriptan yesterday with minimal relief of her symptoms.  She is not on blood thinners.  She denies fever/chills, vision changes, numbness/tingling of extremities.   Headache      Prior to Admission medications   Medication Sig Start Date End Date Taking? Authorizing Provider  albuterol  (PROVENTIL ) (2.5 MG/3ML) 0.083% nebulizer solution Take 3 mLs (2.5 mg total) by nebulization every 6 (six) hours as needed for wheezing or shortness of breath. 04/09/23   Icard, Adine CROME, DO  albuterol  (VENTOLIN  HFA) 108 (90 Base) MCG/ACT inhaler Inhale 2 puffs into the lungs every 6 (six) hours as needed for wheezing or shortness of breath.    [provider]  BIOTIN PO Take by mouth.    [provider]  budeson-glycopyrrolate-formoterol (BREZTRI  AEROSPHERE) 160-9-4.8 MCG/ACT AERO inhaler INHALE 2 PUFFS INTO LUNGS IN THE  MORNING AND AT BEDTIME 03/19/24   Geronimo Amel, MD  Calcium Carb-Cholecalciferol (CALCIUM 500 + D3 PO) Place 1 patch onto the skin daily. Patch Aid Vitamin D3 + Calcium Patch    [provider]  cyanocobalamin  (VITAMIN B12) 500 MCG tablet Take 1 tablet (500 mcg total) by mouth daily. 11/04/23   Bowen, Darice BRAVO, DO  cyclobenzaprine  (FLEXERIL ) 10 MG tablet Take 10 mg by mouth 3 (three) times daily as needed for muscle spasms.    [provider]  diclofenac  Sodium (VOLTAREN ) 1 % GEL Apply 4 g topically 4 (four) times daily. 07/05/24   Emil Share, DO  ergocalciferol  (VITAMIN D2) 1.25 MG (50000 UT) capsule Take 50,000 Units by mouth every Sunday.    [provider]  esomeprazole (NEXIUM) 40 MG capsule Take 40 mg by mouth daily. 08/16/23   [provider]  estradiol (VIVELLE-DOT) 0.0375 MG/24HR Place 1 patch onto the skin 2 (two) times a week. Sundays & Thursdays.    [provider]  folic acid (FOLVITE) 1 MG tablet Take 1 mg by mouth in the morning.    [provider]  gabapentin  (NEURONTIN ) 300 MG capsule Take 300 mg by mouth 3 (three) times daily as needed (nerve pain).    [provider]  meclizine  (ANTIVERT ) 25 MG tablet Take 1 tablet (25 mg total) by mouth 3 (three) times daily as needed for dizziness. 08/12/23   Randol Simmonds, MD  meloxicam (MOBIC) 7.5 MG tablet Take 7.5 mg by mouth 2 (two) times daily as needed (pain.).    [provider]  methylPREDNISolone  (  MEDROL  DOSEPAK) 4 MG TBPK tablet Day 1: 8mg  before breakfast, 4 mg after lunch, 4 mg after supper, and 8 mg at bedtime Day 2: 4 mg before breakfast, 4 mg after lunch, 4 mg  after supper, and 8 mg  at bedtime Day 3:  4 mg  before breakfast, 4 mg  after lunch, 4 mg after supper, and 4 mg  at bedtime Day 4: 4 mg  before breakfast, 4 mg  after lunch, and 4 mg at bedtime Day 5: 4 mg  before breakfast and 4 mg at bedtime Day 6: 4 mg  before breakfast 07/05/24   Floyd, Dan, DO  MULTIPLE  VITAMINS-IRON PO Place 1 patch onto the skin daily. Patch Aid Iron Plus Multivitamin Patch    [provider]  oxyCODONE -acetaminophen  (PERCOCET) 10-325 MG tablet Take 1 tablet by mouth in the morning, at noon, and at bedtime. 12/27/15   [provider]  Phentermine -Topiramate  (QSYMIA ) 15-92 MG CP24 1 capsule po QAM 03/31/24   Bowen, Karen E, DO  rosuvastatin (CRESTOR) 10 MG tablet Take 10 mg by mouth at bedtime.    [provider]  TRULANCE 3 MG TABS Take 3 mg by mouth in the morning.    [provider]    Allergies: Lithium and Aspirin    Review of Systems  Neurological:  Positive for headaches.    Updated Vital Signs BP (!) 149/96   Pulse (!) 53   Temp 97.7 F (36.5 C) (Oral)   Resp 18   Wt 102.1 kg   SpO2 98%   BMI 39.86 kg/m   Physical Exam Vitals and nursing note reviewed.  HENT:     Head: Normocephalic.  Eyes:     Extraocular Movements: Extraocular movements intact.     Pupils: Pupils are equal, round, and reactive to light.     Comments: Photophobia  Neck:     Comments: Posterior neck is tender to palpation, full ROM, no meningismus  Cardiovascular:     Rate and Rhythm: Normal rate.  Pulmonary:     Effort: Pulmonary effort is normal.  Musculoskeletal:     Cervical back: Normal range of motion. Tenderness present. No rigidity.     Comments: Moves all extremities spontaneously without difficulty 5/5 strength against resistance of bilateral upper and lower extremities Grip strength in tact and equal bilaterally  Skin:    General: Skin is warm and dry.  Neurological:     General: No focal deficit present.     Mental Status: She is alert and oriented to person, place, and time.     Comments: Facial expressions are symmetric and intact without evidence of facial droop Normal cerebellar testing without ataxia, including finger-to-nose No appreciable sensory deficit/weakness of extremities     (all labs ordered are listed, but  only abnormal results are displayed) Labs Reviewed  CBC WITH DIFFERENTIAL/PLATELET  BASIC METABOLIC PANEL WITH GFR    EKG: None  Radiology: No results found.   Procedures   Medications Ordered in the ED  prochlorperazine  (COMPAZINE ) injection 10 mg (has no administration in time range)  diphenhydrAMINE  (BENADRYL ) injection 25 mg (has no administration in time range)  sodium chloride  0.9 % bolus 1,000 mL (has no administration in time range)                                    Medical Decision Making This patient presents to the ED  for concern of migraine/neck pain, this involves an extensive number of treatment options, and is a complaint that carries with it a high risk of complications and morbidity.  The differential diagnosis includes vertebral artery aneurysm/dissection/rupture, migraine, tension headache, meningitis    Co morbidities that complicate the patient evaluation  H/o migraines and vertebral artery aneurysm   Additional history obtained:  Additional history obtained from record review External records from outside source obtained and reviewed including recent CTA neck results from 10/1   Lab Tests:  I Ordered, and personally interpreted labs.  The pertinent results include: CBC and BMP are within normal limits.   Imaging Studies ordered:  I ordered imaging studies including CTA head/neck was ordered but was not obtained as patient wished to leave AGAINST MEDICAL ADVICE    Cardiac Monitoring: / EKG:  The patient was maintained on a cardiac monitor.  I personally viewed and interpreted the cardiac monitored which showed an underlying rhythm of: sinus bradycardia   Problem List / ED Course / Critical interventions / Medication management  I ordered medication including compazine /benadryl /fluids  for migraine cockatail  Reevaluation of the patient after these medicines showed that the patient improved I have reviewed the patients home medicines and  have made adjustments as needed   Social Determinants of Health:  Tobacco use, depression   Test / Admission - Considered:  Physical exam notable as above, no focal neurologic deficits on exam, however patient does demonstrate photophobia which she reports is consistent with her migraine history, as well as tenderness to palpation of her posterior neck, however full range of motion of the neck without meningismus.  I am concerned that patient has a history of a left vertebral artery aneurysm, given the change in the nature of her pain migraine symptoms I would like to further evaluate this issue with a CTA of her head/neck.  I discussed this with the patient who is in agreement with this plan. After receiving migraine cocktail, patient notes resolution of her migraine symptoms and wishes to leave prior to completion of CTA head/neck.  I discussed with the patient that I would like to assess for conditions like worsening of her vertebral artery aneurysm as well as dissection/rupture that may be contributing to her symptoms today, she voiced understanding however she does not wish to stay to have this imaging done and wishes to leave AGAINST MEDICAL ADVICE.  She is in her sound mind to make these medical decisions for herself, strict return precautions were discussed.  She plans to follow-up with her neurologist as scheduled.  Management of this patient's care was discussed with Dr. Simon.   Amount and/or Complexity of Data Reviewed Labs: ordered. Radiology: ordered.  Risk Prescription drug management.        Final diagnoses:  Nonintractable headache, unspecified chronicity pattern, unspecified headache type  Aneurysm of left vertebral artery    ED Discharge Orders     None          Glendia Rocky SAILOR, [place] 09/07/24 1249    Simon Lavonia SAILOR, MD 09/07/24 1527

## 2024-09-07 NOTE — ED Triage Notes (Addendum)
 Persistent left ear and occipital headache x 1 month , sent here by PCP for further evaluation due to her Hx of vertebral aneurysm . Reports NV , light sensitivity .  Ambulatory to triage , alert and oriented x 4 .

## 2024-09-07 NOTE — Discharge Instructions (Signed)
 You understand that today you are leaving AGAINST MEDICAL ADVICE, as you did not wait to wish for a CT scan of your head/neck to be obtained.  With your history of a vertebral artery aneurysm, this places you at higher risk of vertebral artery dissection and rupture, which would be best evaluated with a CT scan of your head/neck.  Please return to the emergency department if your symptoms worsen.  Follow-up with your neurologist as scheduled.

## 2024-09-09 ENCOUNTER — Other Ambulatory Visit: Payer: Self-pay | Admitting: Internal Medicine

## 2024-11-03 ENCOUNTER — Encounter (HOSPITAL_COMMUNITY): Payer: Self-pay | Admitting: Emergency Medicine

## 2024-11-03 ENCOUNTER — Emergency Department (HOSPITAL_COMMUNITY)
Admission: EM | Admit: 2024-11-03 | Discharge: 2024-11-03 | Disposition: A | Discharge status: Home / Self Care | Attending: Emergency Medicine | Admitting: Emergency Medicine

## 2024-11-03 ENCOUNTER — Other Ambulatory Visit: Payer: Self-pay

## 2024-11-03 DIAGNOSIS — R102 Pelvic and perineal pain unspecified side: Secondary | ICD-10-CM | POA: Diagnosis not present

## 2024-11-03 DIAGNOSIS — J4489 Other specified chronic obstructive pulmonary disease: Secondary | ICD-10-CM | POA: Diagnosis not present

## 2024-11-03 DIAGNOSIS — N898 Other specified noninflammatory disorders of vagina: Secondary | ICD-10-CM | POA: Diagnosis present

## 2024-11-03 LAB — URINALYSIS, ROUTINE W REFLEX MICROSCOPIC
Bilirubin Urine: NEGATIVE
Glucose, UA: NEGATIVE mg/dL
Hgb urine dipstick: NEGATIVE
Ketones, ur: NEGATIVE mg/dL
Leukocytes,Ua: NEGATIVE
Nitrite: NEGATIVE
Protein, ur: NEGATIVE mg/dL
Specific Gravity, Urine: 1.017 (ref 1.005–1.030)
pH: 5 (ref 5.0–8.0)

## 2024-11-03 LAB — WET PREP, GENITAL
Clue Cells Wet Prep HPF POC: NONE SEEN
Sperm: NONE SEEN
Trich, Wet Prep: NONE SEEN
WBC, Wet Prep HPF POC: <10 (ref <10)
Yeast Wet Prep HPF POC: NONE SEEN

## 2024-11-03 NOTE — ED Provider Notes (Signed)
 [facility] AT [facility] Provider Note   CSN: [id number] Arrival date & time: 11/03/24  1606     Patient presents with: Pelvic Pain   Brittney Sanford is a 58 y.o. female.  Patient with past history significant for asthma, fibromyalgia, chronic back pain, GERD, and arthritis presents the emergency department with concerns of pelvic pain.  Reports that she noticed some increasing pelvic discomfort over the last several days which initially began as vaginal itching that now feels like a burning sensation within the vagina.  Has tried over-the-counter medication without improvement in symptoms.  Denies any urinary symptoms.  She does report that she is prone to BV and she is unsure if this is what this is as she has not had any noticeable discharge or drainage.  Denies any appreciable vaginal odor.   Pelvic Pain       Prior to Admission medications   Medication Sig Start Date End Date Taking? Authorizing Provider  albuterol  (PROVENTIL ) (2.5 MG/3ML) 0.083% nebulizer solution Take 3 mLs (2.5 mg total) by nebulization every 6 (six) hours as needed for wheezing or shortness of breath. 04/09/23   Icard, Adine CROME, DO  albuterol  (VENTOLIN  HFA) 108 (90 Base) MCG/ACT inhaler Inhale 2 puffs into the lungs every 6 (six) hours as needed for wheezing or shortness of breath.    [provider]  BIOTIN PO Take by mouth.    [provider]  budesonide-glycopyrrolate-formoterol (BREZTRI  AEROSPHERE) 160-9-4.8 MCG/ACT AERO inhaler INHALE 2 PUFFS INTO LUNGS IN THE MORNING AND AT BEDTIME 09/09/24   Geronimo Amel, MD  Calcium Carb-Cholecalciferol (CALCIUM 500 + D3 PO) Place 1 patch onto the skin daily. Patch Aid Vitamin D3 + Calcium Patch    [provider]  cyanocobalamin  (VITAMIN B12) 500 MCG tablet Take 1 tablet (500 mcg total) by mouth daily. 11/04/23   Bowen, Darice BRAVO, DO  cyclobenzaprine  (FLEXERIL ) 10 MG tablet Take 10 mg by mouth 3 (three) times  daily as needed for muscle spasms.    [provider]  diclofenac  Sodium (VOLTAREN ) 1 % GEL Apply 4 g topically 4 (four) times daily. 07/05/24   Emil Share, DO  ergocalciferol  (VITAMIN D2) 1.25 MG (50000 UT) capsule Take 50,000 Units by mouth every Sunday.    [provider]  esomeprazole (NEXIUM) 40 MG capsule Take 40 mg by mouth daily. 08/16/23   [provider]  estradiol (VIVELLE-DOT) 0.0375 MG/24HR Place 1 patch onto the skin 2 (two) times a week. Sundays & Thursdays.    [provider]  folic acid (FOLVITE) 1 MG tablet Take 1 mg by mouth in the morning.    [provider]  gabapentin  (NEURONTIN ) 300 MG capsule Take 300 mg by mouth 3 (three) times daily as needed (nerve pain).    [provider]  meclizine  (ANTIVERT ) 25 MG tablet Take 1 tablet (25 mg total) by mouth 3 (three) times daily as needed for dizziness. 08/12/23   Randol Simmonds, MD  meloxicam (MOBIC) 7.5 MG tablet Take 7.5 mg by mouth 2 (two) times daily as needed (pain.).    [provider]  methylPREDNISolone  (MEDROL  DOSEPAK) 4 MG TBPK tablet Day 1: 8mg  before breakfast, 4 mg after lunch, 4 mg after supper, and 8 mg at bedtime Day 2: 4 mg before breakfast, 4 mg after lunch, 4 mg  after supper, and 8 mg  at bedtime Day 3:  4 mg  before breakfast, 4 mg  after lunch, 4 mg after supper, and  4 mg  at bedtime Day 4: 4 mg  before breakfast, 4 mg  after lunch, and 4 mg at bedtime Day 5: 4 mg  before breakfast and 4 mg at bedtime Day 6: 4 mg  before breakfast 07/05/24   Floyd, Dan, DO  MULTIPLE VITAMINS-IRON PO Place 1 patch onto the skin daily. Patch Aid Iron Plus Multivitamin Patch    [provider]  oxyCODONE -acetaminophen  (PERCOCET) 10-325 MG tablet Take 1 tablet by mouth in the morning, at noon, and at bedtime. 12/27/15   [provider]  Phentermine -Topiramate  (QSYMIA ) 15-92 MG CP24 1 capsule po QAM 03/31/24   Bowen, Karen E, DO  rosuvastatin (CRESTOR) 10 MG tablet Take  10 mg by mouth at bedtime.    [provider]  TRULANCE 3 MG TABS Take 3 mg by mouth in the morning.    [provider]    Allergies: Lithium and Aspirin    Review of Systems  Genitourinary:  Positive for pelvic pain.  All other systems reviewed and are negative.   Updated Vital Signs BP 110/75   Pulse (!) 53   Temp 98.7 F (37.1 C) (Oral)   Resp 18   Ht 5' 3 (1.6 m)   Wt 123.8 kg   SpO2 97%   BMI 48.36 kg/m   Physical Exam Vitals and nursing note reviewed.  Constitutional:      General: She is not in acute distress.    Appearance: She is well-developed.  HENT:     Head: Normocephalic and atraumatic.  Eyes:     Conjunctiva/sclera: Conjunctivae normal.  Cardiovascular:     Rate and Rhythm: Normal rate and regular rhythm.     Heart sounds: No murmur heard. Pulmonary:     Effort: Pulmonary effort is normal. No respiratory distress.     Breath sounds: Normal breath sounds.  Abdominal:     General: Abdomen is flat. Bowel sounds are normal. There is no distension.     Palpations: Abdomen is soft.     Tenderness: There is no abdominal tenderness. There is no guarding.  Genitourinary:    Comments: GU deferred. Musculoskeletal:        General: No swelling.     Cervical back: Neck supple.  Skin:    General: Skin is warm and dry.     Capillary Refill: Capillary refill takes less than 2 seconds.  Neurological:     Mental Status: She is alert.  Psychiatric:        Mood and Affect: Mood normal.     (all labs ordered are listed, but only abnormal results are displayed) Labs Reviewed  WET PREP, GENITAL  URINALYSIS, ROUTINE W REFLEX MICROSCOPIC    EKG: None  Radiology: No results found.   Procedures   Medications Ordered in the ED - No data to display                                  Medical Decision Making Amount and/or Complexity of Data Reviewed Labs: ordered.   This patient presents to the ED for concern of vaginal irritation.   Differential diagnosis includes UTI, bacterial vaginosis, trichomonas vaginalis, vaginal dryness    Additional history obtained:  Additional history obtained from chart review   Lab Tests:  I Ordered, and personally interpreted labs.  The pertinent results include:  UA negative for infection, wet prep unremarkable   Problem List / ED Course:  Patient with past history significant for obesity, asthma, fibromyalgia, COPD, arthritis, and prior gastric sleeve resection presents to the ED with concerns of vaginal irritation. States that she has been having vaginal itching and discomfort for several days not improved with Monistat. Denies any urinary discomfort including pain, urgency, or frequency. No abdominal pain. She does report she is sexually active with one female partner and that her symptoms started somewhat soon after most recent episode of vaginal intercourse. Patient also did advise that she has a history of vaginal dryness and currently uses a topical estrogen cream for this dryness but has not noticed any significant improvement. Physical exam is unremarkable with no focal abdominal tenderness or guarding seen. No abnormal heart or lung sounds. GU exam deferred. UA and wet prep obtained and appear to be normal. I informed patient of negative workup at this time no findings to suggest UTI, BV, trichomonas, or other clear GU or GYN concern. I offered to perform a pelvic exam but patient continues to decline at this time. With no other significant symptoms except vaginal irritation, I suspect this is likely due to friction from sex as patient states that with her increased vaginal dryness not responding to topical estrogen, her and partner have not used any lubricant in the interim. Given patient does not want any further exam or testing, I advised patient to refrain from vaginal intercourse until irritation resolved but that she will likely need to follow up with her GYN for further evaluation  and management. Return precautions discussed. Discharged home in stable condition.   Social Determinants of Health:  None   Final diagnoses:  Vaginal irritation    ED Discharge Orders     None          Brittney Sanford DELENA DEVONNA 11/04/24 1332    Tegeler, Lonni PARAS, MD 11/05/24 2342

## 2024-11-03 NOTE — ED Triage Notes (Addendum)
 Patient presents due to pelvic pain. First she experienced vaginal itching. OTC cream helped the itching but burning pain persists. Nothing has helped the pain. Patient reports she is prone to BV.

## 2024-11-03 NOTE — Discharge Instructions (Signed)
 You were seen in the emergency department today for concerns of vaginal irritation.  You were negative on your urine for any concerns of infection.  Your wet prep did not show signs of bacterial vaginosis, yeast infection, or trichomonas.  Based on what you shared with me, I do suspect this is likely due through irritation in the vaginal canal from sites.  Try to use lubricants in the future to reduce irritation.  Follow-up close with her gynecologist for further evaluation if your symptoms or not improving in the next several days.  If you notice any development of discharge, drainage, urinary symptoms, or significant abdominal pain, please return the emergency department for evaluation.

## 2024-11-17 ENCOUNTER — Encounter: Payer: Self-pay | Admitting: Internal Medicine

## 2024-11-17 ENCOUNTER — Ambulatory Visit: Admitting: Internal Medicine

## 2024-11-17 ENCOUNTER — Ambulatory Visit (INDEPENDENT_AMBULATORY_CARE_PROVIDER_SITE_OTHER)

## 2024-11-17 VITALS — BP 124/72 | HR 52 | Temp 97.8°F | Ht 64.0 in | Wt 232.8 lb

## 2024-11-17 DIAGNOSIS — J439 Emphysema, unspecified: Secondary | ICD-10-CM

## 2024-11-17 DIAGNOSIS — J849 Interstitial pulmonary disease, unspecified: Secondary | ICD-10-CM | POA: Diagnosis not present

## 2024-11-17 DIAGNOSIS — Z720 Tobacco use: Secondary | ICD-10-CM

## 2024-11-17 DIAGNOSIS — F17210 Nicotine dependence, cigarettes, uncomplicated: Secondary | ICD-10-CM | POA: Diagnosis not present

## 2024-11-17 DIAGNOSIS — C966 Unifocal Langerhans-cell histiocytosis: Secondary | ICD-10-CM

## 2024-11-17 DIAGNOSIS — K2289 Other specified disease of esophagus: Secondary | ICD-10-CM

## 2024-11-17 DIAGNOSIS — Z122 Encounter for screening for malignant neoplasm of respiratory organs: Secondary | ICD-10-CM

## 2024-11-17 DIAGNOSIS — J84117 Desquamative interstitial pneumonia: Secondary | ICD-10-CM

## 2024-11-17 DIAGNOSIS — I288 Other diseases of pulmonary vessels: Secondary | ICD-10-CM | POA: Diagnosis not present

## 2024-11-17 LAB — PULMONARY FUNCTION TEST
DL/VA % pred: 66 %
DL/VA: 2.83 ml/min/mmHg/L
DLCO unc % pred: 58 %
DLCO unc: 11.93 ml/min/mmHg
FEF 25-75 Pre: 1.67 L/s
FEF2575-%Pred-Pre: 67 %
FEV1-%Pred-Pre: 79 %
FEV1-Pre: 2.09 L
FEV1FVC-%Pred-Pre: 95 %
FEV6-%Pred-Pre: 83 %
FEV6-Pre: 2.75 L
FEV6FVC-%Pred-Pre: 102 %
FVC-%Pred-Pre: 81 %
FVC-Pre: 2.76 L
Pre FEV1/FVC ratio: 75 %
Pre FEV6/FVC Ratio: 99 %

## 2024-11-17 NOTE — Patient Instructions (Addendum)
 ICD-10-CM   1. ILD (interstitial lung disease) (HCC)  J84.9     2. 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 Screening for lung cancer  Z12.2     5. Tobacco abuse  Z72.0     6. 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  Diagnosis based on age, smoking, features on the CT scan and nondiagnostic bronchoscopy.  This condition typically improves/cures with quitting smoking but will progress with continued smoking. - There is also mild inflammation along with HX and we believe this is also smoking-related  -There ws barely this condition in 2019 and is definitely more prominent as of May 2024 and in June 2025 stable x 1  year  - PFT Aug 2024 shows impaired lung function 1/2 to 2/3rd normal. But in Dec 2025 it is stable  Plan  - do HRCT in summer 2026 -schedule June , 2026 -= respect declining flu shot  Emphysema  - stable  Plan  - continue daily breztri  - refill albuterol   Lung cancer screening age > 44  Plan  - capture information in HRCT in summer 2026 -June 2026  Encounter for smoking cessation counseling Tobacco abuse  - unable to quit on own and failed [facility] quit smoking program, prior wellbutrin  and prior chantix   Plan - -respect your struggles to quit  - hope you can quit one day   Patulous esophagus  June 2025 CT chest with eophagus contains food debris,   Plan  - please follow with your GI doc  Followup -June 2026 with Dr. Geronimo or APP  after CT scan of the chest; 15 min

## 2024-11-17 NOTE — Progress Notes (Signed)
 OV saral groce 54?28/38  58 year old female current every day smoker  with a history of asthma, COPD. No prior PFTs per patient. Longstanding history of tobacco use. No family history of sarcoidosis. She has a history of rheumatoid arthritis, but is not on any maintenance medications .  At her max was smoking 2 packs/day. She was referred for evaluation of her underlying emphysema COPD asthma. CT imaging of the chest completed during her recent [facility] visit in March 2024  revealed upper lobe predominant cystic changes and interstitial changes with scattered pulmonary nodules.There is no history of family lung cancer. She was seen by Dr. Brenna, who 04/09/2023 who ordered PFT's, Started the patient on Breztri  to see if this helps with her symptomatology. Counseled her heavily on smoking cessation , and started her on Welbutrin   Dr. Brenna also consulted with  Dr. Geronimo. He felt the differential diagnoses are HX, Sarcoid, Respiratory Bronchiolitis -ILD >> He advised Bronch first > if non diagnostic -> consider SLB OR direct SLB based on shared decision making and safety/risk profile.If bronch or TTBx but with caveat 30-50% yield rate. Can the lab do CD-1a and CD207 in tissue and BAL fluid, which is highly specific for HX.  She underwent Flexible video fiberoptic bronchoscopy with robotic assistance and biopsies on 05/05/2023. Target was LUL, and in addition BAL from RUL was done . Biopsies showed no malignant cells, however specimen demonstrates pulmonary parenchyma with chronic inflammation and interstitial fibrosis.Dr. Brenna suspects suspect this is [facility] but she is still smoking . 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 There were also atypical spindle cells noted but there were so few cells on the block that features were not sufficient to support a diagnosis of Langerhans cell histiocytosis [facility]).Recommendation that if clinical concern for [facility] persists, re-biopsy for more material is  necessary to evaluate the possibility.     Pt. Presents today for follow up bronch. Procedure was done 05/05/2023. She states she has done well after the bronch. No hemoptysis. She did have a sore throat, this has self resolved.She has had continued pain and chest tightness. She did call the office 05/09/2023. Dr. Gladis prescribed her a  prednisone  taper, but she has not started it. She states she does have improvement in her chest pain with prednisone , so she has agreed to start this today.  I have sent biopsy results to Dr. Geronimo to evaluate, in addition to Dr. Brenna. It does confirm fibrosis, but not specific type. There were also atypical spindle cells noted but there were so few cells on the block that features were not sufficient to support a diagnosis of Langerhans cell histiocytosis.  Plan is to review at ILD conference, follow up with Dr. Geronimo and then determine if surgical lung biopsy should be next step as bronch was not definitive.  She will also start the prednisone  taper Dr. Gladis prescribed 05/09/2023.      OV 06/16/2023 -referred by Dr. Adine [facility] to Dr. Geronimo in the ILD center.  Subjective:  Patient ID: Brittney [facility], female , DOB: 11-25-1966 , age 58 y.o. , MRN: [id number] , ADDRESS: [address] [place] [address] PCP Brittney Fernando HERO, MD Patient Care Team: Brittney Fernando HERO, MD as PCP - General (Internal Medicine) Brittney Brittney [facility] HERO, MD (Cardiology)  This Provider for this visit: Treatment Team:  Attending Provider: Geronimo Amel, MD    06/16/2023 -   Chief Complaint  Patient presents with  Follow-up    No concerns, a little cough, chest pain,      HPI Brittney [facility] 58 y.o. -presents with her sister and her grandchildren.  The grandchildren are below 80 years of age and are visiting from [place].  None of the family members are independent historians today.  History is gained from talking to the patient and also review of the prior  medical records.  She has been a heavy smoker and for the last 1 years experienced shortness of breath and cough and for the last 2 to 3 months is also having atypical chest pain from the sternal area radiating to the right infra axillary area that comes and goes it is mild but occasionally severe.  It does wake her up.  There is no sweating.  It is also worse with deep inspiration.  She definitely feels she wants treatment for this.  She has been on Breztri  inhaler but without much relief.  Did discuss her CT scan of the chest last week at the interstitial lung disease conference.  My early suspicion when Dr. Brenna referred her to me was histocytosis X.  To this end he did bronchoscopy.  It was nondiagnostic but some single cells were seen.  Cultures negative.'s at the case conference Dr. 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 Personal visualization of a previous scan from 2019 her findings of upper lobe cyst were very minimal but currently is a lot more extensive.  In addition there is pulmonary infiltrates in the upper and lower lobes.  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  Explained to her that quitting smoking was a #1 and best treatment.  Greater than 70% improving/cure rate if she quit smoking but without quitting smoking she can progress.  Did indicate to her that surgical lung biopsy to make a definitive diagnosis was an option but the current pretest probably is very high.  We took a shared decision making to hold off on surgical lung biopsy and focus on quitting smoking.  Regarding associated DIP/ILD: After the visit I did order autoimmune serology profile but when the CMA went to discuss this with her she did not want to get this done.  We will call her maybe address this at the next visit.  She tried Wellbutrin  from the office in the past but  she said it gave her chest pain.  I do not think the side effect was related to the Wellbutrin  probably was coincidental.  However did discuss Chantix .  She does not have bad dreams.  Did indicate the side effects of Chantix .  She currently smoking 2 cigarettes to 3 cigarettes a day.  Did indicate to her she could cut down by 1 cigarette every few days and then in a month stop smoking altogether.  Meanwhile she can start Chantix  straightaway and did explain the benefits of Chantix  by preventing withdrawal symptoms.  Regarding her symptoms she is most worried about the atypical chest pain.  I want to make sure there is no cardiac issue so we will get an echocardiogram..  In addition I want to make sure there is no bone involvement.  I have written to Dr. Bonnetta via email to see whether we should get an MRI chest or she could just look at the CT chest sternum again or maybe get a bone scan.    CAse cnferece July 2024   -  Discussion synopsis:  In 2019 lungs had mild emphyasema only without nodularity. In 2024 there is sparring of lung base but has UL predminiant disase with nodularity and small cavitary nodules and ground glass opacities.   - What is the final conclusion per 2018 ATS/Fleischner Criteria - Overall per Dr Candise - classic HX and some DIP changes likely  - Concordance with official report: discordant - was not read by thoracic radioligy  CT Chest data from date:04/30/23 - was super D CT  - personally visualized and independently interpreted : yes during MDD conference last week with Dr Bonnetta - my findings are: Histiocytosis X and some DIP. T his is in variance with official report   OV 02/23/2024  Subjective:  Patient ID: Brittney [facility], female , DOB: 23-Apr-1966 , age 67 y.o. , MRN: [id number] , ADDRESS: [address] [place] [address] PCP Brittney Fernando HERO, MD Patient Care Team: Brittney Fernando HERO, MD as PCP - General (Internal Medicine) Brittney Brittney [facility] HERO, MD (Cardiology)  This Provider for this visit: Treatment Team:  Attending Provider: Geronimo Amel, MD    02/23/2024 -   Chief Complaint  Patient presents with   Follow-up    Everything is going ok, occ dry cough, had back surgery Dec 7     HPI Brittney [facility] 58 y.o. -presents for follow-up.  Since her last visit she had pulmonary function test that shows 50-60% restriction with low DLCO.  She says respiratory wise she is stable.  See symptom score below.  She continues to smoke she is been unable to quit.  Chantix  gave her side effects.  We discussed referring to the [place] quit smoking program and she is interested in this.  It is now almost a year since she got a CT scan of the chest.  I did discuss to about lung cancer screening and she is interested in getting a CT scan of the chest but it be a high-resolution given her histiocytosis X and also DIP.  Last visit she declined serology and I also ordered a bone scan because of her Hx but she has not gotten this done.  We will hold off on this at the moment.  Currently just focus on quitting smoking and getting neck CT scan of the chest in a few months and she is open to this idea.  She did have back surgery in December 2024 and she slowly improving.         OV 03/21/2024  Subjective:  Patient ID: Brittney [facility], female , DOB: 31-Jul-1966 , age 43 y.o. , MRN: [id number] , ADDRESS: [address] [place] [address] PCP Brittney Fernando HERO, MD Patient Care Team: Brittney Fernando HERO, MD as PCP - General (Internal Medicine) Brittney Brittney [facility] HERO, MD (Cardiology)  This Provider for this visit: Treatment Team:  Attending Provider: Geronimo Amel, MD   03/21/2024 -   Chief Complaint  Patient presents with   Follow-up    Increased SOB since 03/14/24. She went to ED with CP 03/15/24 and went home after had neg cards workup.       HPI Brittney [facility] 58 y.o. -she is making an acute visit.  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  She also has atypical chest pain.  She had deferred doing serology testing.  Then on March 14, 2024 she had acute onset of sternal pain.  This resulted in ER visit because the pain persisted March 15, 2024.  At this point in time a week out the pain is gone but she does has a dull ache.  There is reproducible tenderness.  ER visit reviewed.  Chest x-ray is basically clear other than chronic changes.  Basic blood work is normal.  She is frustrated by this.  She continues to smoke she is yet to quit.  She has upcoming CT scan of the chest in June 2025.   Her overall symptom burden remains elevated [see below].   OV 11/17/2024  Subjective:  Patient ID: Brittney [facility], female , DOB: 1966-05-19 , age 84 y.o. , MRN: [id number] , ADDRESS: [address] [place] [address] PCP Brittney Fernando HERO, MD Patient Care Team: Brittney Fernando HERO, MD as PCP - General (Internal Medicine) Brittney Brittney [facility] HERO, MD (Cardiology)  This Provider for this visit: Treatment Team:  Attending Provider: Geronimo Amel, MD   #Histiocytosis X associated with DIP #Smoking #Bronchitis /emphysema symptoms on Breztri  #Lung cancer screening  - last June 2025 without cancer  11/17/2024 -   Chief Complaint  Patient presents with   Interstitial Lung Disease    Pt states since LOV breathing has been the same SOB remains the same Dry cough Pt refused to do sit and stand test     HPI Brittney [facility] 58 y.o. -Last seen April 2025.  In iterms BMET OCt 2025 is nromal. Did have HRCT in June 2025  -> no cancer but has stable HX with nodules, emphyema and enlarged PA   Brittney [facility] is a 58 year old female with HX/DIP and associated smoking and emphysema who presents for pulmonary follow-up.  She has a history of emphysema related to smoking and is currently using an inhaler, specifically 'Breztri '. No changes in her breathing have been noted recently. She has a history of  smoking and has attempted to quit using Wellbutrin , Chantix , patches, and gum, but has not been successful.  HX/DIP - stable symptoms. Still smokes. PFT FVC better. DLCO same  She experiences migraines, which have led to some emergency room visits. She is currently under the care of a headache wellness clinic for this issue.  She has acid reflux and is under the care of a gastroenterologist at [facility], where she is on medication for this condition. She underwent gastric sleeve surgery for weight loss in the past. CT summer 2025 shows patulosu esophagusy with food.  No new medical problems or hospitalizations have occurred since the last visit. No changes in breathing.    SYMPTOM SCALE - ILD 06/16/2023 02/23/2024  03/21/2024  11/17/2024   Current weight    Supportive care  O2 use ra     Shortness of Breath 0 -> 5 scale with 5 being worst (score 6 If unable to do)     At rest 0 0 3 2  Simple tasks - showers, clothes change, eating, shaving 4 4 5 5   Household (dishes, doing bed, laundry) 4 4 5 5   Shopping 4 5 5 5   Walking level at own pace 4 3 5 5   Walking up Stairs  5 5 5   Total (30-36) Dyspnea Score 20 21 28 27   How bad is your cough? 4 2 1 3   How bad is your fatigue 0 5 3 5   How bad is nausea 0 0 2 0  How bad is vomiting?  0 0 0 0  How bad is diarrhea? 0 0 0 0  How bad is anxiety? 0 2 3 2   How bad  is depression 0 3 0 0  Any chronic pain - if so where and how bad 0 0 0     CT Chest data from date: June 2025  - personally visualized and independently interpreted : NO - my findings are: AS below  IMPRESSION: 1. Pulmonary Langerhans cell histiocytosis, stable. Associated pulmonary nodules and nodular densities, measuring up to 8 mm in the left upper lobe, stable. Recommend follow-up in 1 year as malignancy cannot be definitively excluded. 2. Mildly patulous esophagus contains food debris, findings which can be seen with dysmotility and/or gastroesophageal reflux. 3.   Aortic atherosclerosis (ICD10-I70.0). 4. Enlarged pulmonic trunk, indicative of pulmonary arterial hypertension. 5.  Emphysema (ICD10-J43.9).     Electronically Signed   By: Newell Eke M.D.   On: 05/06/2024 10:19  PFT     Latest Ref Rng & Units 11/17/2024    8:05 AM 07/20/2023    3:32 PM  PFT Results  FVC-Pre L 2.76  P 2.34   FVC-Predicted Pre % 81  P 68   FVC-Post L  2.30   FVC-Predicted Post %  67   Pre FEV1/FVC % % 75  P 73   Post FEV1/FCV % %  79   FEV1-Pre L 2.09  P 1.70   FEV1-Predicted Pre % 79  P 63   FEV1-Post L  1.81   DLCO uncorrected ml/min/mmHg 11.93  P 11.49   DLCO UNC% % 58  P 55   DLCO corrected ml/min/mmHg  11.49   DLCO COR %Predicted %  55   DLVA Predicted % 66  P 72     P Preliminary result       LAB RESULTS last 96 hours No results found.       has a past medical history of Anemia, Anxiety, Arthritis, Asthma, Chronic back pain, Chronic back pain, COPD (chronic obstructive pulmonary disease) (HCC), COVID-19, Depression, Fibromyalgia, GERD (gastroesophageal reflux disease), Headache, Hyperlipidemia, Pseudoaneurysm, Rheumatoid arthritis (HCC), Sleep apnea, and Sleep apnea.   reports that she has been smoking cigarettes. She started smoking about 47 years ago. She has a 143.9 pack-year smoking history. She has never used smokeless tobacco.  Past Surgical History:  Procedure Laterality Date   ABDOMINAL HYSTERECTOMY     APPENDECTOMY     BACK SURGERY     BRONCHIAL BIOPSY  05/05/2023   Procedure: BRONCHIAL BIOPSIES;  Surgeon: Brittney [facility] Brittney CROME, DO;  Location: [facility];  Service: Thoracic;;   BRONCHIAL BRUSHINGS  05/05/2023   Procedure: BRONCHIAL BRUSHINGS;  Surgeon: Brittney [facility] Brittney CROME, DO;  Location: [facility];  Service: Thoracic;;   BRONCHIAL NEEDLE ASPIRATION BIOPSY  05/05/2023   Procedure: BRONCHIAL NEEDLE ASPIRATION BIOPSIES;  Surgeon: Brittney [facility]ford Brittney CROME, DO;  Location: [facility];  Service: Thoracic;;   BRONCHIAL WASHINGS  05/05/2023    Procedure: BRONCHIAL WASHINGS;  Surgeon: Brittney [facility]ford Brittney CROME, DO;  Location: [facility];  Service: Thoracic;;   CHOLECYSTECTOMY     LAPAROSCOPIC GASTRIC SLEEVE RESECTION N/A 01/07/2021   Procedure: LAPAROSCOPIC GASTRIC SLEEVE RESECTION AND HIATAL HERNIA REPAIR;  Surgeon: Brittney [facility] Cough, MD;  Location: [facility];  Service: General;  Laterality: N/A;   OVARIAN CYST REMOVAL     UPPER GI ENDOSCOPY N/A 01/07/2021   Procedure: UPPER GI ENDOSCOPY;  Surgeon: Brittney [facility] Cough, MD;  Location: [facility];  Service: General;  Laterality: N/A;    Allergies[1]  Immunization History  Administered Date(s) Administered   Influenza Split 09/14/2017   Influenza-Unspecified 10/17/2015   Moderna Sars-Covid-2 Vaccination 06/28/2020, 08/11/2020  Family History  Problem Relation Age of Onset   COPD Mother    Emphysema Mother    Alcoholism Mother    Alcoholism Father    Stroke Father    Hyperlipidemia Father    Hypertension Father    Obesity Father     Current Medications[2]      Objective:   Vitals:   11/17/24 0900  BP: 124/72  Pulse: (!) 52  Temp: 97.8 F (36.6 C)  TempSrc: Oral  SpO2: 97%  Weight: 232 lb 12.8 oz (105.6 kg)  Height: 5' 4 (1.626 m)    Estimated body mass index is 39.96 kg/m as calculated from the following:   Height as of this encounter: 5' 4 (1.626 m).   Weight as of this encounter: 232 lb 12.8 oz (105.6 kg).  @WEIGHTCHANGE @  [employer]   11/17/24 0900  Weight: 232 lb 12.8 oz (105.6 kg)     Physical Exam   General: No distress. Looks well O2 at rest: no Cane present: no Sitting in wheel chair: no Frail: no Obese: YES Neuro: Alert and Oriented x 3. GCS 15. Speech normal Psych: Pleasant Resp:  Barrel Chest - no.  Wheeze - no, Crackles - no, No overt respiratory distress CVS: Normal heart sounds. Murmurs - no Ext: Stigmata of Connective Tissue Disease - no HEENT: Normal upper airway. PEERL +. No post nasal drip        Assessment/     Assessment &  Plan ILD (interstitial lung disease) (HCC)  Pulmonary histiocytosis x (HCC)  DIP (desquamative interstitial pneumonia) (HCC)  Screening for lung cancer  Tobacco abuse  Chronic obstructive pulmonary disease with emphysema, unspecified emphysema type (HCC)  Enlarged pulmonary artery (HCC)  Patulous lower esophageal sphincter    PLAN Patient Instructions     ICD-10-CM   1. ILD (interstitial lung disease) (HCC)  J84.9     2. 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 Screening for lung cancer  Z12.2     5. Tobacco abuse  Z72.0     6. 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  Diagnosis based on age, smoking, features on the CT scan and nondiagnostic bronchoscopy.  This condition typically improves/cures with quitting smoking but will progress with continued smoking. - There is also mild inflammation along with HX and we believe this is also smoking-related  -There ws barely this condition in 2019 and is definitely more prominent as of May 2024 and in June 2025 stable x 1  year  - PFT Aug 2024 shows impaired lung function 1/2 to 2/3rd normal. But in Dec 2025 it is stable  Plan  - do HRCT in summer 2026 -schedule June , 2026 -= respect declining flu shot  Emphysema  - stable  Plan  - continue daily breztri  - refill albuterol   Lung cancer screening age > 39  Plan  - capture information in HRCT in summer 2026 -June 2026  Encounter for smoking cessation counseling Tobacco abuse  - unable to quit on own and failed [facility] program, prior wellbutrin  and prior chantix   Plan - -respect  your struggles to quit  - hope you can quit one day   Patulous esophagus  June 2025 CT chest with eophagus contains food debris,   Plan  - please follow with your GI doc  Followup -June 2026 with Dr. Geronimo or APP  after CT scan of the chest; 15 min    FOLLOWUP    Return for -June 2026 with Dr. Geronimo or APP  after CT scan of the chest; 15 min.    SIGNATURE    Dr. Dorethia Brittney [facility], M.D., F.C.C.P,  Pulmonary and Critical Care Medicine Staff Physician, [facility] Director - Interstitial Lung Disease  Program  Pulmonary Fibrosis [facility] at [facility] [place], [place], [address]  Pager: [phone], If no answer or between  15:00h - 7:00h: call [phone] Telephone: [phone]  9:21 AM 11/17/2024    [1]  Allergies Allergen Reactions   Lithium Shortness Of Breath, Nausea And Vomiting, Nausea Only and Rash    Difficulty breathing   Aspirin Rash  [2]  Current Outpatient Medications:    albuterol  (PROVENTIL ) (2.5 MG/3ML) 0.083% nebulizer solution, Take 3 mLs (2.5 mg total) by nebulization every 6 (six) hours as needed for wheezing or shortness of breath., Disp: 75 mL, Rfl: 12   BIOTIN PO, Take by mouth., Disp: , Rfl:    budesonide-glycopyrrolate-formoterol (BREZTRI  AEROSPHERE) 160-9-4.8 MCG/ACT AERO inhaler, INHALE 2 PUFFS INTO LUNGS IN THE MORNING AND AT BEDTIME, Disp: 11 g, Rfl: 3   cyclobenzaprine  (FLEXERIL ) 10 MG tablet, Take 10 mg by mouth 3 (three) times daily as needed for muscle spasms., Disp: , Rfl:    folic acid (FOLVITE) 1 MG tablet, Take 1 mg by mouth in the morning., Disp: , Rfl:    gabapentin  (NEURONTIN ) 300 MG capsule, Take 300 mg by mouth 3 (three) times daily as needed (nerve pain)., Disp: , Rfl:    meclizine  (ANTIVERT ) 25 MG tablet, Take 1 tablet (25 mg total) by mouth 3 (three) times daily as needed for dizziness., Disp: 30 tablet, Rfl: 0   meloxicam (MOBIC) 7.5 MG tablet, Take 7.5 mg by mouth 2  (two) times daily as needed (pain.)., Disp: , Rfl:    oxyCODONE -acetaminophen  (PERCOCET) 10-325 MG tablet, Take 1 tablet by mouth in the morning, at noon, and at bedtime., Disp: , Rfl:    rosuvastatin (CRESTOR) 10 MG tablet, Take 10 mg by mouth at bedtime., Disp: , Rfl:    TRULANCE 3 MG TABS, Take 3 mg by mouth in the morning., Disp: , Rfl:    albuterol  (VENTOLIN  HFA) 108 (90 Base) MCG/ACT inhaler, Inhale 2 puffs into the lungs every 6 (six) hours as needed for wheezing or shortness of breath., Disp: 1 each, Rfl: 6   Calcium Carb-Cholecalciferol (CALCIUM 500 + D3 PO), Place 1 patch onto the skin daily. Patch Aid Vitamin D3 + Calcium Patch (Patient not taking: Reported on 11/17/2024), Disp: , Rfl:    cyanocobalamin  (VITAMIN B12) 500 MCG tablet, Take 1 tablet (500 mcg total) by mouth daily. (Patient not taking: Reported on 11/17/2024), Disp: 30 tablet, Rfl: 2   diclofenac  Sodium (VOLTAREN ) 1 % GEL, Apply 4 g topically 4 (four) times daily., Disp: 100 g, Rfl: 0   ergocalciferol  (VITAMIN D2) 1.25 MG (50000 UT) capsule, Take 50,000 Units by mouth every Sunday. (Patient not taking: Reported on 11/17/2024), Disp: , Rfl:    esomeprazole (NEXIUM) 40 MG capsule, Take 40 mg by mouth daily. (  Patient not taking: Reported on 11/17/2024), Disp: , Rfl:    estradiol (VIVELLE-DOT) 0.0375 MG/24HR, Place 1 patch onto the skin 2 (two) times a week. Sundays & Thursdays., Disp: , Rfl:    methylPREDNISolone  (MEDROL  DOSEPAK) 4 MG TBPK tablet, Day 1: 8mg  before breakfast, 4 mg after lunch, 4 mg after supper, and 8 mg at bedtime Day 2: 4 mg before breakfast, 4 mg after lunch, 4 mg  after supper, and 8 mg  at bedtime Day 3:  4 mg  before breakfast, 4 mg  after lunch, 4 mg after supper, and 4 mg  at bedtime Day 4: 4 mg  before breakfast, 4 mg  after lunch, and 4 mg at bedtime Day 5: 4 mg  before breakfast and 4 mg at bedtime Day 6: 4 mg  before breakfast (Patient not taking: Reported on 11/17/2024), Disp: 1 each, Rfl: 0   MULTIPLE  VITAMINS-IRON PO, Place 1 patch onto the skin daily. Patch Aid Iron Plus Multivitamin Patch (Patient not taking: Reported on 11/17/2024), Disp: , Rfl:    Phentermine -Topiramate  (QSYMIA ) 15-92 MG CP24, 1 capsule po QAM, Disp: 30 capsule, Rfl: 1

## 2024-11-17 NOTE — Progress Notes (Signed)
Spiro/DLCO performed today. 

## 2024-11-17 NOTE — Patient Instructions (Signed)
Spiro/DLCO performed today. 

## 2024-12-06 ENCOUNTER — Encounter (HOSPITAL_BASED_OUTPATIENT_CLINIC_OR_DEPARTMENT_OTHER): Payer: Self-pay

## 2024-12-07 ENCOUNTER — Other Ambulatory Visit (INDEPENDENT_AMBULATORY_CARE_PROVIDER_SITE_OTHER)

## 2024-12-07 DIAGNOSIS — I288 Other diseases of pulmonary vessels: Secondary | ICD-10-CM | POA: Diagnosis not present

## 2024-12-07 LAB — ECHOCARDIOGRAM COMPLETE
Area-P 1/2: 3.6 cm2
MV M vel: 4.62 m/s
MV Peak grad: 85.4 mmHg
S' Lateral: 2.97 cm

## 2024-12-20 ENCOUNTER — Other Ambulatory Visit: Payer: Self-pay

## 2024-12-20 ENCOUNTER — Emergency Department (HOSPITAL_COMMUNITY)
Admission: EM | Admit: 2024-12-20 | Discharge: 2024-12-21 | Disposition: A | Discharge status: Home / Self Care | Attending: Emergency Medicine | Admitting: Emergency Medicine

## 2024-12-20 DIAGNOSIS — Z8616 Personal history of COVID-19: Secondary | ICD-10-CM | POA: Diagnosis not present

## 2024-12-20 DIAGNOSIS — R519 Headache, unspecified: Secondary | ICD-10-CM | POA: Diagnosis present

## 2024-12-20 DIAGNOSIS — G44039 Episodic paroxysmal hemicrania, not intractable: Secondary | ICD-10-CM | POA: Diagnosis not present

## 2024-12-20 DIAGNOSIS — J4489 Other specified chronic obstructive pulmonary disease: Secondary | ICD-10-CM | POA: Diagnosis not present

## 2024-12-20 DIAGNOSIS — R42 Dizziness and giddiness: Secondary | ICD-10-CM | POA: Diagnosis not present

## 2024-12-20 LAB — URINALYSIS, ROUTINE W REFLEX MICROSCOPIC
Bilirubin Urine: NEGATIVE
Glucose, UA: NEGATIVE mg/dL
Ketones, ur: NEGATIVE mg/dL
Leukocytes,Ua: NEGATIVE
Nitrite: NEGATIVE
Protein, ur: NEGATIVE mg/dL
Specific Gravity, Urine: 1.021 (ref 1.005–1.030)
pH: 6 (ref 5.0–8.0)

## 2024-12-20 LAB — CBC
HCT: 40.6 % (ref 36.0–46.0)
Hemoglobin: 13.6 g/dL (ref 12.0–15.0)
MCH: 30.7 pg (ref 26.0–34.0)
MCHC: 33.5 g/dL (ref 30.0–36.0)
MCV: 91.6 fL (ref 80.0–100.0)
Platelets: 188 K/uL (ref 150–400)
RBC: 4.43 MIL/uL (ref 3.87–5.11)
RDW: 14.6 % (ref 11.5–15.5)
WBC: 9.1 K/uL (ref 4.0–10.5)
nRBC: 0 % (ref 0.0–0.2)

## 2024-12-20 MED ORDER — PROCHLORPERAZINE EDISYLATE 10 MG/2ML IJ SOLN
5.0000 mg | Freq: Once | INTRAMUSCULAR | Status: AC
  Administered 2024-12-21: 5 mg via INTRAVENOUS
  Filled 2024-12-20: qty 2

## 2024-12-20 MED ORDER — SODIUM CHLORIDE 0.9 % IV BOLUS
500.0000 mL | Freq: Once | INTRAVENOUS | Status: AC
  Administered 2024-12-21: 500 mL via INTRAVENOUS

## 2024-12-20 NOTE — ED Triage Notes (Addendum)
 Pt has c/o dizziness for a week, but got worse today, also sharp headache pains and nausea that began today. Pt has hx of vertigo, but states this feels different.

## 2024-12-20 NOTE — ED Provider Notes (Signed)
 " [place] EMERGENCY DEPARTMENT AT [facility] Provider Note  CSN: [id number] Arrival date & time: 12/20/24 2122  Chief Complaint(s) Dizziness and Headache  History provided by patient. HPI & MDM Brittney Sanford is a 59 y.o. female here for.   Dizziness Associated symptoms: headaches   Headache Associated symptoms: dizziness    Lightheadedness/dizziness has been ongoing for several years intermittently. This episode has been for 2-3 weeks. At times felt as room spinning when standing or moving. Associated with N/V for several days. Also has intermittent sharp shooting, electrical like pain to left side of head. Pains last only for a second or two and self resolve. No alleviating or aggravating factors. No recent fevers or infection. Patient has a chronic, unchanged cough 2/2 COPD. No chest pain or SOB No Abd pain or diarrhea. No urinary sx. No vision changes or focal deficits.   Medical Decision Making Amount and/or Complexity of Data Reviewed Labs: ordered. Decision-making details documented in ED Course. ECG/medicine tests: ordered and independent interpretation performed. Decision-making details documented in ED Course.  Risk OTC drugs. Prescription drug management.   Medical Decision Making:  The patient's presentation involves an extensive number of treatment options, and the complaint(s) carry with it a high risk of complications and morbidity. The differential diagnosis includes but not limited to those listed below:  Patients presentation was most suspicious for occipital neuralgia related to muscle strain/spasm of the left cervical/upper back musculature. Also considered migrainous process. Patient provided with IVF and compazine  Symptoms completely resolved.  Tolerated PO  EKG w/o dysrrhythmias or blocks.  No focal exam that would be concerning for CVA. 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 Exam is not concerning for Temporal arteritis Exam not totally consistent with peripheral vertigo at this time. Other than lightheadedness, she is otherwise asymptomatic. Work up w/o anemia or evidence of dehydration. No significant electrolyte or metabolic derangements.   Final Clinical Impression(s) / ED Diagnoses Final diagnoses:  Dizziness  Episodic paroxysmal hemicrania, not intractable   The patient appears reasonably screened and/or stabilized for discharge and I doubt any other medical condition or other [facility] requiring further screening, evaluation, or treatment in the ED at this time. I have discussed the findings, Dx and Tx plan with the patient/family who expressed understanding and agree(s) with the plan. Discharge instructions discussed at length. The patient/family was given strict return precautions who verbalized understanding of the instructions. No further questions at time of discharge.  Disposition: Discharge  Condition: Good  ED Discharge Orders          Ordered    prochlorperazine  (COMPAZINE ) 10 MG tablet  2 times daily PRN        12/21/24 0226              Follow Up: Dorena Fernando HERO, MD [phone] [facility] [address] [place] [address] [phone]  Call  to schedule an appointment for close follow up     Past Medical History Past Medical History:  Diagnosis Date   Anemia    Anxiety    History of   Arthritis    Asthma    Chronic back pain    Chronic back pain    COPD (chronic obstructive pulmonary disease) (HCC)    COVID-19    2020   Depression    History of   Fibromyalgia  GERD (gastroesophageal reflux disease)    Headache    MIGRAINES   Hyperlipidemia    Pseudoaneurysm    behind left ear per pt    Rheumatoid arthritis (HCC)    Sleep apnea    CPAP    Sleep apnea    Patient Active Problem List   Diagnosis Date Noted   OSA (obstructive sleep  apnea) 04/11/2024   Low vitamin B12 level 10/13/2023   Vitamin D  deficiency 09/01/2023   Back pain 08/07/2023   Pulmonary infiltrates 05/05/2023   Panniculitis 12/23/2022   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 Authorizing Provider  prochlorperazine  (COMPAZINE ) 10 MG tablet Take 1 tablet (10 mg total) by mouth 2 (two) times daily as needed for nausea or vomiting. 12/21/24  Yes Davon Abdelaziz, Raynell Moder, MD  albuterol  (PROVENTIL ) (2.5 MG/3ML) 0.083% nebulizer solution Take 3 mLs (2.5 mg total) by nebulization every 6 (six) hours as needed for wheezing or shortness of breath. 04/09/23   Brenna Adine CROME, DO  albuterol  (VENTOLIN  HFA) 108 (90 Base) MCG/ACT inhaler Inhale 2 puffs into the lungs every 6 (six) hours as needed for wheezing or shortness of breath. 11/17/24   Ramaswamy, Murali, MD  BIOTIN PO Take by mouth.    [provider]  budesonide-glycopyrrolate-formoterol (BREZTRI  AEROSPHERE) 160-9-4.8 MCG/ACT AERO inhaler INHALE 2 PUFFS INTO LUNGS IN THE MORNING AND AT BEDTIME 09/09/24   Geronimo Amel, MD  Calcium Carb-Cholecalciferol (CALCIUM 500 + D3 PO) Place 1 patch onto the skin daily. Patch Aid Vitamin D3 + Calcium Patch Patient not taking: Reported on 11/17/2024    [provider]  cyanocobalamin  (VITAMIN B12) 500 MCG tablet Take 1 tablet (500 mcg total) by mouth daily. Patient not taking: Reported on 11/17/2024 11/04/23   Bowen, Darice BRAVO, DO  cyclobenzaprine  (FLEXERIL ) 10 MG tablet Take 10 mg by mouth 3 (three) times daily as needed for muscle spasms.    [provider]  diclofenac  Sodium (VOLTAREN ) 1 % GEL Apply 4 g topically 4 (four) times daily. 07/05/24   Emil Share, DO  ergocalciferol  (VITAMIN D2) 1.25 MG (50000 UT) capsule Take 50,000 Units by mouth every Sunday. Patient not taking: Reported on 11/17/2024    [provider]   esomeprazole (NEXIUM) 40 MG capsule Take 40 mg by mouth daily. Patient not taking: Reported on 11/17/2024 08/16/23   [provider]  estradiol (VIVELLE-DOT) 0.0375 MG/24HR Place 1 patch onto the skin 2 (two) times a week. Sundays & Thursdays.    [provider]  folic acid (FOLVITE) 1 MG tablet Take 1 mg by mouth in the morning.    [provider]  gabapentin  (NEURONTIN ) 300 MG capsule Take 300 mg by mouth 3 (three) times daily as needed (nerve pain).    [provider]  meclizine  (ANTIVERT ) 25 MG tablet Take 1 tablet (25 mg total) by mouth 3 (three) times daily as needed for dizziness. 08/12/23   Randol Simmonds, MD  meloxicam (MOBIC) 7.5 MG tablet Take 7.5 mg by mouth 2 (two) times daily as needed (pain.).    [provider]  methylPREDNISolone  (MEDROL  DOSEPAK) 4 MG TBPK tablet Day 1: 8mg  before breakfast, 4 mg after lunch, 4 mg after supper, and 8 mg at bedtime Day 2: 4 mg before breakfast, 4 mg after lunch, 4 mg  after supper, and 8 mg  at bedtime Day 3:  4 mg  before breakfast,  4 mg  after lunch, 4 mg after supper, and 4 mg  at bedtime Day 4: 4 mg  before breakfast, 4 mg  after lunch, and 4 mg at bedtime Day 5: 4 mg  before breakfast and 4 mg at bedtime Day 6: 4 mg  before breakfast Patient not taking: Reported on 11/17/2024 07/05/24   Floyd, Dan, DO  MULTIPLE VITAMINS-IRON PO Place 1 patch onto the skin daily. Patch Aid Iron Plus Multivitamin Patch Patient not taking: Reported on 11/17/2024    [provider]  oxyCODONE -acetaminophen  (PERCOCET) 10-325 MG tablet Take 1 tablet by mouth in the morning, at noon, and at bedtime. 12/27/15   [provider]  Phentermine -Topiramate  (QSYMIA ) 15-92 MG CP24 1 capsule po QAM 03/31/24   Bowen, Karen E, DO  rosuvastatin (CRESTOR) 10 MG tablet Take 10 mg by mouth at bedtime.    [provider]  TRULANCE 3 MG TABS Take 3 mg by mouth in the morning.    [provider]                                                                                                                                     Allergies Lithium and Aspirin  Review of Systems Review of Systems  Neurological:  Positive for dizziness and headaches.   As noted in HPI  Physical Exam Vital Signs  I have reviewed the triage vital signs BP 117/83   Pulse 66   Temp 98.1 F (36.7 C) (Oral)   Resp 16   SpO2 93%   Physical Exam Vitals reviewed.  Constitutional:      General: She is not in acute distress.    Appearance: She is well-developed. She is not diaphoretic.  HENT:     Head: Normocephalic and atraumatic.     Right Ear: Tympanic membrane and external ear normal.     Left Ear: Tympanic membrane and external ear normal.     Nose: Nose normal.  Eyes:     General: No scleral icterus.    Conjunctiva/sclera: Conjunctivae normal.  Neck:     Trachea: Phonation normal.  Cardiovascular:     Rate and Rhythm: Normal rate and regular rhythm.  Pulmonary:     Effort: Pulmonary effort is normal. No respiratory distress.     Breath sounds: No stridor.  Abdominal:     General: There is no distension.  Musculoskeletal:        General: Normal range of motion.     Cervical back: Normal range of motion.  Neurological:     Mental Status: She is alert and oriented to person, place, and time.     Comments: Mental Status:  Alert and oriented to person, place, and time.  Attention and concentration normal.  Speech clear.  Recent memory is intact  Cranial Nerves:  II Visual Fields: Intact to confrontation. Visual fields intact. III, IV, VI: Pupils equal and reactive to  light and near. Full eye movement without nystagmus  V Facial Sensation: Normal. No weakness of masticatory muscles  VII: No facial weakness or asymmetry  VIII Auditory Acuity: Grossly normal  IX/X: The uvula is midline; the palate elevates symmetrically  XI: Normal sternocleidomastoid and trapezius strength  XII: The tongue is midline. No  atrophy or fasciculations.   Motor System: Muscle Strength: 5/5 and symmetric in the upper and lower extremities. No pronation or drift.  Muscle Tone: Tone and muscle bulk are normal in the upper and lower extremities.  Coordination: No tremor.  Sensation: Intact to light touch  Gait: Routine gait normal.   Psychiatric:        Behavior: Behavior normal.     ED Results and Treatments Labs (all labs ordered are listed, but only abnormal results are displayed) Labs Reviewed  URINALYSIS, ROUTINE W REFLEX MICROSCOPIC - Abnormal; Notable for the following components:      Result Value   Hgb urine dipstick MODERATE (*)    Bacteria, UA RARE (*)    All other components within normal limits  I-STAT CHEM 8, ED - Abnormal; Notable for the following components:   Glucose, Bld 107 (*)    All other components within normal limits  CBC  COMPREHENSIVE METABOLIC PANEL WITH GFR  CBG MONITORING, ED                                                                                                                         EKG  EKG Interpretation Date/Time:  Tuesday December 20 2024 21:36:36 EST Ventricular Rate:  61 PR Interval:  155 QRS Duration:  91 QT Interval:  430 QTC Calculation: 434 R Axis:   31  Text Interpretation: Sinus rhythm Low voltage, precordial leads No significant change was found Confirmed by Trine Likes ([id number]) on 12/20/2024 11:07:47 PM       Radiology No results found.  Medications Ordered in ED Medications  prochlorperazine  (COMPAZINE ) injection 5 mg (5 mg Intravenous Given 12/21/24 0029)  sodium chloride  0.9 % bolus 500 mL (0 mLs Intravenous Stopped 12/21/24 0119)  acetaminophen  (TYLENOL ) tablet 1,000 mg (1,000 mg Oral Given 12/21/24 0144)  prochlorperazine  (COMPAZINE ) injection 5 mg (5 mg Intravenous Given 12/21/24 0144)   Procedures Procedures  (including critical care time)   This chart was dictated using voice recognition software.  Despite best efforts to  proofread,  errors can occur which can change the documentation meaning.   Trine Likes Moder, MD 12/21/24 [phone]  "

## 2024-12-21 LAB — I-STAT CHEM 8, ED
BUN: 7 mg/dL (ref 6–20)
Calcium, Ion: 1.2 mmol/L (ref 1.15–1.40)
Chloride: 105 mmol/L (ref 98–111)
Creatinine, Ser: 0.8 mg/dL (ref 0.44–1.00)
Glucose, Bld: 107 mg/dL — ABNORMAL HIGH (ref 70–99)
HCT: 37 % (ref 36.0–46.0)
Hemoglobin: 12.6 g/dL (ref 12.0–15.0)
Potassium: 4.1 mmol/L (ref 3.5–5.1)
Sodium: 143 mmol/L (ref 135–145)
TCO2: 26 mmol/L (ref 22–32)

## 2024-12-21 MED ORDER — ACETAMINOPHEN 500 MG PO TABS
1000.0000 mg | ORAL_TABLET | Freq: Once | ORAL | Status: AC
  Administered 2024-12-21: 1000 mg via ORAL
  Filled 2024-12-21: qty 2

## 2024-12-21 MED ORDER — PROCHLORPERAZINE MALEATE 10 MG PO TABS: 10.0000 mg | ORAL_TABLET | Freq: Two times a day (BID) | ORAL | 0 refills | Status: AC | PRN

## 2024-12-21 MED ORDER — PROCHLORPERAZINE EDISYLATE 10 MG/2ML IJ SOLN
5.0000 mg | Freq: Once | INTRAMUSCULAR | Status: AC
  Administered 2024-12-21: 5 mg via INTRAVENOUS
  Filled 2024-12-21: qty 2

## 2024-12-30 ENCOUNTER — Ambulatory Visit: Payer: Self-pay | Admitting: Internal Medicine

## 2024-12-30 NOTE — Progress Notes (Signed)
 Normal echo.

## 2025-05-18 ENCOUNTER — Other Ambulatory Visit
